# Patient Record
Sex: Female | Born: 1965 | Race: Black or African American | Hispanic: No | Marital: Single | Smoking: Current every day smoker
Health system: Southern US, Community
[De-identification: ages and names within clinical notes are randomized; demographics above are authoritative.]

## PROBLEM LIST (undated history)

## (undated) DIAGNOSIS — I639 Cerebral infarction, unspecified: Secondary | ICD-10-CM

## (undated) DIAGNOSIS — I10 Essential (primary) hypertension: Secondary | ICD-10-CM

## (undated) DIAGNOSIS — F1910 Other psychoactive substance abuse, uncomplicated: Secondary | ICD-10-CM

## (undated) DIAGNOSIS — T7840XA Allergy, unspecified, initial encounter: Secondary | ICD-10-CM

## (undated) DIAGNOSIS — E785 Hyperlipidemia, unspecified: Secondary | ICD-10-CM

## (undated) DIAGNOSIS — M1990 Unspecified osteoarthritis, unspecified site: Secondary | ICD-10-CM

## (undated) HISTORY — PX: ABDOMINAL SURGERY: SHX537

## (undated) HISTORY — DX: Other psychoactive substance abuse, uncomplicated: F19.10

## (undated) HISTORY — PX: TUBAL LIGATION: SHX77

## (undated) HISTORY — DX: Essential (primary) hypertension: I10

## (undated) HISTORY — DX: Unspecified osteoarthritis, unspecified site: M19.90

## (undated) HISTORY — DX: Allergy, unspecified, initial encounter: T78.40XA

---

## 2013-11-28 ENCOUNTER — Emergency Department: Payer: Self-pay | Admitting: Emergency Medicine

## 2014-06-05 ENCOUNTER — Other Ambulatory Visit: Payer: Self-pay | Admitting: Family Medicine

## 2014-06-05 ENCOUNTER — Ambulatory Visit
Admission: RE | Admit: 2014-06-05 | Discharge: 2014-06-05 | Disposition: A | Discharge status: Home / Self Care | Payer: Disability Insurance | Source: Ambulatory Visit | Attending: Family Medicine | Admitting: Family Medicine

## 2014-06-05 DIAGNOSIS — M25562 Pain in left knee: Secondary | ICD-10-CM

## 2014-06-05 DIAGNOSIS — M25561 Pain in right knee: Secondary | ICD-10-CM | POA: Diagnosis present

## 2014-06-05 DIAGNOSIS — M1712 Unilateral primary osteoarthritis, left knee: Secondary | ICD-10-CM | POA: Insufficient documentation

## 2017-08-10 ENCOUNTER — Encounter: Payer: Self-pay | Admitting: Emergency Medicine

## 2017-08-10 ENCOUNTER — Emergency Department
Admission: EM | Admit: 2017-08-10 | Discharge: 2017-08-10 | Disposition: A | Discharge status: Home / Self Care | Payer: Self-pay | Attending: Emergency Medicine | Admitting: Emergency Medicine

## 2017-08-10 ENCOUNTER — Emergency Department: Payer: Self-pay

## 2017-08-10 DIAGNOSIS — I10 Essential (primary) hypertension: Secondary | ICD-10-CM | POA: Insufficient documentation

## 2017-08-10 DIAGNOSIS — Z79899 Other long term (current) drug therapy: Secondary | ICD-10-CM | POA: Insufficient documentation

## 2017-08-10 DIAGNOSIS — M47816 Spondylosis without myelopathy or radiculopathy, lumbar region: Secondary | ICD-10-CM | POA: Insufficient documentation

## 2017-08-10 LAB — URINALYSIS, COMPLETE (UACMP) WITH MICROSCOPIC
Bacteria, UA: NONE SEEN
Bilirubin Urine: NEGATIVE
GLUCOSE, UA: NEGATIVE mg/dL
HGB URINE DIPSTICK: NEGATIVE
Ketones, ur: NEGATIVE mg/dL
Leukocytes, UA: NEGATIVE
NITRITE: NEGATIVE
PROTEIN: NEGATIVE mg/dL
Specific Gravity, Urine: 1.026 (ref 1.005–1.030)
pH: 5 (ref 5.0–8.0)

## 2017-08-10 MED ORDER — LISINOPRIL 10 MG PO TABS: 10.0000 mg | ORAL_TABLET | Freq: Every day | ORAL | 0 refills | Status: DC

## 2017-08-10 MED ORDER — MELOXICAM 15 MG PO TABS: 15.0000 mg | ORAL_TABLET | Freq: Every day | ORAL | 0 refills | Status: DC

## 2017-08-10 MED ORDER — CLONIDINE HCL 0.1 MG PO TABS
0.2000 mg | ORAL_TABLET | Freq: Once | ORAL | Status: AC
  Administered 2017-08-10: 0.2 mg via ORAL
  Filled 2017-08-10: qty 2

## 2017-08-10 MED ORDER — CYCLOBENZAPRINE HCL 10 MG PO TABS: 10.0000 mg | ORAL_TABLET | Freq: Three times a day (TID) | ORAL | 0 refills | Status: DC | PRN

## 2017-08-10 MED ORDER — TRAMADOL HCL 50 MG PO TABS
50.0000 mg | ORAL_TABLET | Freq: Once | ORAL | Status: AC
  Administered 2017-08-10: 50 mg via ORAL
  Filled 2017-08-10: qty 1

## 2017-08-10 MED ORDER — CYCLOBENZAPRINE HCL 10 MG PO TABS
10.0000 mg | ORAL_TABLET | Freq: Once | ORAL | Status: AC
  Administered 2017-08-10: 10 mg via ORAL
  Filled 2017-08-10: qty 1

## 2017-08-10 MED ORDER — TRAMADOL HCL 50 MG PO TABS: 50.0000 mg | ORAL_TABLET | Freq: Two times a day (BID) | ORAL | 0 refills | Status: DC | PRN

## 2017-08-10 NOTE — Discharge Instructions (Signed)
Take medication as directed pending establish care with PCP.

## 2017-08-10 NOTE — ED Triage Notes (Signed)
Patient presents to the ED with lower back pain x 2 weeks.  Patient denies any known injury to back.  Patient is in no obvious distress at this time.

## 2017-08-10 NOTE — ED Provider Notes (Signed)
[facility] Emergency Department Provider Note   ____________________________________________   First MD Initiated Contact with Patient 08/10/17 1617     (approximate)  I have reviewed the triage vital signs and the nursing notes.   HISTORY  Chief Complaint Back Pain    HPI Kimberly Mcdonald is a 52 y.o. female patient complain of increasing low back pain for 2 weeks.  Patient denies provocative incident for complaint.  Patient denies bladder or bowel dysfunction.  Patient denies radicular component to her back pain.  Patient rates the pain as a 10/10.  No palliative measure for complaint.  Patient presented for blood pressure of 169/121.  Patient acknowledged history of hypertension but reports noncompliance with medications.  Patient states she does not have a PCP.  Patient states she has not taken blood pressure medication in 7 years and did not know the name of her last hypertension medication.  Patient denies vision disturbance, headache, or weakness.  History reviewed. No pertinent past medical history.  There are no active problems to display for this patient.   History reviewed. No pertinent surgical history.  Prior to Admission medications   Medication Sig Start Date End Date Taking? Authorizing Provider  cyclobenzaprine (FLEXERIL) 10 MG tablet Take 1 tablet (10 mg total) by mouth 3 (three) times daily as needed. 08/10/17   Joni Reining, PA-C  lisinopril (PRINIVIL,ZESTRIL) 10 MG tablet Take 1 tablet (10 mg total) by mouth daily. 08/10/17 08/10/18  Joni Reining, PA-C  meloxicam (MOBIC) 15 MG tablet Take 1 tablet (15 mg total) by mouth daily. 08/10/17   Joni Reining, PA-C  traMADol (ULTRAM) 50 MG tablet Take 1 tablet (50 mg total) by mouth every 12 (twelve) hours as needed. 08/10/17   Joni Reining, PA-C    Allergies Patient has no known allergies.  No family history on file.  Social History Social History   Tobacco Use  .  Smoking status: Never Smoker  . Smokeless tobacco: Never Used  Substance Use Topics  . Alcohol use: Not on file  . Drug use: Not on file    Review of Systems Constitutional: No fever/chills Eyes: No visual changes. ENT: No sore throat. Cardiovascular: Denies chest pain. Respiratory: Denies shortness of breath. Gastrointestinal: No abdominal pain.  No nausea, no vomiting.  No diarrhea.  No constipation. Genitourinary: Negative for dysuria. Musculoskeletal: Positive for back pain. Skin: Negative for rash. Neurological: Negative for headaches, focal weakness, or numbness. Endocrine:Hypertension.   ____________________________________________   PHYSICAL EXAM:  VITAL SIGNS: ED Triage Vitals  Enc Vitals Group     BP 08/10/17 1604 (!) 169/121     Pulse Rate 08/10/17 1604 93     Resp 08/10/17 1604 18     Temp 08/10/17 1604 99 F (37.2 C)     Temp Source 08/10/17 1604 Oral     SpO2 08/10/17 1604 97 %     Weight 08/10/17 1605 214 lb (97.1 kg)     Height 08/10/17 1605 5\' 3"  (1.6 m)     Head Circumference --      Peak Flow --      Pain Score 08/10/17 1605 10     Pain Loc --      Pain Edu? --      Excl. in GC? --    Constitutional: Alert and oriented. Well appearing and in no acute distress. Cardiovascular: Normal rate, regular rhythm. Grossly normal heart sounds.  Good peripheral circulation.  Elevated blood pressure. Respiratory:  Normal respiratory effort.  No retractions. Lungs CTAB. Musculoskeletal: Bilateral lower extremity crepitus with palpation of the knee..  No joint effusions. Neurologic:  Normal speech and language. No gross focal neurologic deficits are appreciated. No gait instability. Skin:  Skin is warm, dry and intact. No rash noted. Psychiatric: Mood and affect are normal. Speech and behavior are normal.  ____________________________________________   LABS (all labs ordered are listed, but only abnormal results are displayed)  Labs Reviewed  URINALYSIS,  COMPLETE (UACMP) WITH MICROSCOPIC - Abnormal; Notable for the following components:      Result Value   Color, Urine YELLOW (*)    APPearance HAZY (*)    All other components within normal limits   ____________________________________________  EKG   ____________________________________________  RADIOLOGY  ED MD interpretation:    Official radiology report(s): Dg Lumbar Spine 2-3 Views  Result Date: 08/10/2017 CLINICAL DATA:  Lower back pain for 2 weeks, increasing EXAM: LUMBAR SPINE - 2-3 VIEW COMPARISON:  None FINDINGS: Five non-rib-bearing lumbar vertebra. Mild dextroconvex lumbar scoliosis. Facet degenerative changes L5-S1, less L4-L5. Vertebral body heights maintained with minimal anterolisthesis at L4-L5. Remaining alignments normal. Disc space narrowing at T11-T12 and T12-L1 with small endplate spurs. No fracture, additional subluxation, bone destruction or obvious spondylolysis. Mild sclerosis at the SI joints bilaterally. IMPRESSION: Degenerative changes of the thoracolumbar spine as above. Mild degenerative changes at the SI joints bilaterally Electronically Signed   By: Ulyses SouthwardMark  Boles M.D.   On: 08/10/2017 17:34    ____________________________________________   PROCEDURES  Procedure(s) performed: None  Procedures  Critical Care performed: No  ____________________________________________   INITIAL IMPRESSION / ASSESSMENT AND PLAN / ED COURSE  As part of my medical decision making, I reviewed the following data within the electronic MEDICAL RECORD NUMBER    Patient presents with 2 weeks of low back pain.  Patient also has elevated blood pressure.  Discussed x-ray findings showing degenerative change in the lumbar spine.  Patient blood pressure was lowered to 139/87 status post 0.2 mg Catapres.  Patient is also given tramadol and Flexeril which greatly reduce her low back pain.  Patient given discharge care instructions.  Patient advised to establish care with PCP.  Patient  given 1 month supply lisinopril and meloxicam.      ____________________________________________   FINAL CLINICAL IMPRESSION(S) / ED DIAGNOSES  Final diagnoses:  Spondylosis of lumbar region without myelopathy or radiculopathy  Essential hypertension     ED Discharge Orders        Ordered    meloxicam (MOBIC) 15 MG tablet  Daily     08/10/17 1827    lisinopril (PRINIVIL,ZESTRIL) 10 MG tablet  Daily     08/10/17 1827    traMADol (ULTRAM) 50 MG tablet  Every 12 hours PRN     08/10/17 1827    cyclobenzaprine (FLEXERIL) 10 MG tablet  3 times daily PRN     08/10/17 1827       Note:  This document was prepared using Dragon voice recognition software and may include unintentional dictation errors.    Joni ReiningSmith, Ladainian Therien K, PA-C 08/10/17 1850    Emily FilbertWilliams, Jonathan E, MD 08/10/17 2034

## 2017-08-20 ENCOUNTER — Ambulatory Visit: Payer: Self-pay

## 2017-09-01 ENCOUNTER — Ambulatory Visit: Payer: Self-pay

## 2017-09-03 ENCOUNTER — Ambulatory Visit: Payer: Self-pay | Admitting: Adult Health Nurse Practitioner

## 2017-09-03 DIAGNOSIS — M5441 Lumbago with sciatica, right side: Secondary | ICD-10-CM

## 2017-09-03 DIAGNOSIS — M1990 Unspecified osteoarthritis, unspecified site: Secondary | ICD-10-CM

## 2017-09-03 DIAGNOSIS — Z0000 Encounter for general adult medical examination without abnormal findings: Secondary | ICD-10-CM

## 2017-09-03 DIAGNOSIS — G8929 Other chronic pain: Secondary | ICD-10-CM

## 2017-09-03 DIAGNOSIS — I10 Essential (primary) hypertension: Secondary | ICD-10-CM

## 2017-09-03 DIAGNOSIS — M545 Low back pain, unspecified: Secondary | ICD-10-CM | POA: Insufficient documentation

## 2017-09-03 MED ORDER — LISINOPRIL 20 MG PO TABS: 20.0000 mg | ORAL_TABLET | Freq: Every day | ORAL | 1 refills | Status: DC

## 2017-09-03 MED ORDER — PREDNISONE 5 MG (21) PO TBPK: ORAL_TABLET | ORAL | 0 refills | Status: DC

## 2017-09-03 NOTE — Patient Instructions (Signed)

## 2017-09-03 NOTE — Progress Notes (Signed)
   Subjective:    Patient ID: Kimberly Mcdonald, female    DOB: November 12, 1965, 52 y.o.   MRN: [id number]  HPI  Kimberly Mcdonald is a 52 yo F here to establish care with us. She was recently seen in the ED on 08/10/17 for Lumbar Spondylosis and was also dx of Hypertension. She was given 1 mo supply of Lisinopril, Meloxicam, Tramadol, and Flexeril. HTN: BP elevated tonight at 185/105. Pt endorses that she has been taking Lisinopril. She reports in 2012 she was taking 5 HTN meds and a fluid pill but has not been taking them since [place]  Arthritis and Chronic bilateral low back pain: She reports pain with no improvement since ED visit and she is taking Tramadol and Flexeril. She reports she has some numbness from her R hip down.  Menopause: She reports hot flashes.  Pt is a current smoker and drinks occasionally. She actively uses marijuana.     There are no active problems to display for this patient.  Allergies as of 09/03/2017   No Known Allergies     Medication List        Accurate as of 09/03/17  6:27 PM. Always use your most recent med list.          cyclobenzaprine 10 MG tablet Commonly known as:  FLEXERIL Take 1 tablet (10 mg total) by mouth 3 (three) times daily as needed.   ibuprofen 200 MG tablet Commonly known as:  ADVIL,MOTRIN Take 200 mg by mouth every 6 (six) hours as needed.   lisinopril 10 MG tablet Commonly known as:  PRINIVIL,ZESTRIL Take 1 tablet (10 mg total) by mouth daily.   meloxicam 15 MG tablet Commonly known as:  MOBIC Take 1 tablet (15 mg total) by mouth daily.        Review of Systems  HENT: Negative for mouth sores.   Musculoskeletal: Negative for back pain.  All other systems reviewed and are negative.      Objective:   Physical Exam  Constitutional: She is oriented to person, place, and time. She appears well-developed and well-nourished.  Cardiovascular: Normal rate, regular rhythm and normal heart  sounds.  Pulmonary/Chest: Effort normal and breath sounds normal.  Abdominal: Soft. Bowel sounds are normal.  Musculoskeletal:  Negative straight leg raise  Neurological: She is alert and oriented to person, place, and time.  Psychiatric: She has a normal mood and affect. Her behavior is normal. Judgment and thought content normal.  Vitals reviewed.   BP (!) 185/105   Pulse 90   Temp 98.9 F (37.2 C)   Ht 5\' 3"  (1.6 m)   Wt 217 lb 8 oz (98.7 kg)   BMI 38.53 kg/m        Assessment & Plan:   HTN: Not controlled.  Increase Lisinopril to 20mg  from 10mg .   Arthritis and Chronic bilateral low back pain: Refer to our Orthopedic.  Rx Prednisone 5mg  21-pack Gave pt exercise information.   Routine labs tonight.   FU in 4 weeks for lab review and BP check.   F/u in 1 mo to review labs and monitor HTN.

## 2017-09-04 LAB — COMPREHENSIVE METABOLIC PANEL
ALBUMIN: 4 g/dL (ref 3.5–5.5)
ALT: 5 IU/L (ref 0–32)
AST: 10 IU/L (ref 0–40)
Albumin/Globulin Ratio: 1.3 (ref 1.2–2.2)
Alkaline Phosphatase: 97 IU/L (ref 39–117)
BUN/Creatinine Ratio: 30 — ABNORMAL HIGH (ref 9–23)
BUN: 20 mg/dL (ref 6–24)
Bilirubin Total: 0.4 mg/dL (ref 0.0–1.2)
CO2: 22 mmol/L (ref 20–29)
Calcium: 9.6 mg/dL (ref 8.7–10.2)
Chloride: 106 mmol/L (ref 96–106)
Creatinine, Ser: 0.67 mg/dL (ref 0.57–1.00)
GFR calc Af Amer: 116 mL/min/{1.73_m2} (ref >59)
GFR, EST NON AFRICAN AMERICAN: 101 mL/min/{1.73_m2} (ref >59)
GLOBULIN, TOTAL: 3 g/dL (ref 1.5–4.5)
Glucose: 77 mg/dL (ref 65–99)
Potassium: 3.9 mmol/L (ref 3.5–5.2)
SODIUM: 141 mmol/L (ref 134–144)
TOTAL PROTEIN: 7 g/dL (ref 6.0–8.5)

## 2017-09-04 LAB — CBC
HEMATOCRIT: 34.5 % (ref 34.0–46.6)
HEMOGLOBIN: 11.1 g/dL (ref 11.1–15.9)
MCH: 27.9 pg (ref 26.6–33.0)
MCHC: 32.2 g/dL (ref 31.5–35.7)
MCV: 87 fL (ref 79–97)
Platelets: 325 10*3/uL (ref 150–450)
RBC: 3.98 x10E6/uL (ref 3.77–5.28)
RDW: 15.8 % — ABNORMAL HIGH (ref 12.3–15.4)
WBC: 7.8 10*3/uL (ref 3.4–10.8)

## 2017-09-04 LAB — LIPID PANEL
CHOL/HDL RATIO: 3.5 ratio (ref 0.0–4.4)
Cholesterol, Total: 186 mg/dL (ref 100–199)
HDL: 53 mg/dL (ref >39)
LDL Calculated: 115 mg/dL — ABNORMAL HIGH (ref 0–99)
Triglycerides: 90 mg/dL (ref 0–149)
VLDL Cholesterol Cal: 18 mg/dL (ref 5–40)

## 2017-09-04 LAB — TSH: TSH: 1.7 u[IU]/mL (ref 0.450–4.500)

## 2017-09-04 LAB — HEMOGLOBIN A1C
ESTIMATED AVERAGE GLUCOSE: 100 mg/dL
Hgb A1c MFr Bld: 5.1 % (ref 4.8–5.6)

## 2017-09-08 ENCOUNTER — Telehealth: Payer: Self-pay | Admitting: Adult Health Nurse Practitioner

## 2017-09-08 NOTE — Telephone Encounter (Signed)
Patient called to ask where prescriptions were sent. Called and let her know at [facility]. She said that she was expecting a pain medication to be send (Advil or something else)

## 2017-09-09 ENCOUNTER — Ambulatory Visit: Payer: Self-pay | Admitting: Specialist

## 2017-09-09 ENCOUNTER — Other Ambulatory Visit: Payer: Self-pay

## 2017-09-16 ENCOUNTER — Ambulatory Visit: Payer: Self-pay | Admitting: Specialist

## 2017-09-16 DIAGNOSIS — M170 Bilateral primary osteoarthritis of knee: Secondary | ICD-10-CM

## 2017-09-16 NOTE — Progress Notes (Signed)
   Subjective:    Patient ID: Kimberly Mcdonald, female    DOB: 1965-09-03, 52 y.o.   MRN: [id number]  HPI 52 yr old with known OA of knees bilateral. Had X-Rays in 2015 that show end stage OA. Has been on Mobic with no help. Was on medrol pose pak with some relief.    Review of Systems   Works as Advertising copywriter. Does stairs one at a time and has difficulty bending to clean. Complained of low back pain I am not dealing with today.     Objective:   Physical Exam  Antalgic gate. Unable to heal/toe walk. Unable to squat. On inspection, left leg alignment neutral; right has significant genvarum.   Sitting she has full active extension of right knee with patellofemoral crepitus. On left, she lacks 20 degrees of full extension. Supine, no effusion. On left she has 0-120 flexion. On right she has 0-130 degrees of felxion. No M-L laxity.                                     Assessment & Plan:  End stage OA knees. She ultimately needs TKAs. Tells me she does not have the time currently but would be interested in steroid injection. We will refer to [facility].

## 2017-09-21 ENCOUNTER — Ambulatory Visit: Payer: Disability Insurance

## 2017-10-01 ENCOUNTER — Encounter: Payer: Self-pay | Admitting: Adult Health Nurse Practitioner

## 2017-10-01 ENCOUNTER — Ambulatory Visit: Payer: Self-pay | Admitting: Adult Health Nurse Practitioner

## 2017-10-01 VITALS — BP 191/118 | HR 90 | Temp 98.0°F | Ht 62.5 in | Wt 214.5 lb

## 2017-10-01 DIAGNOSIS — M1990 Unspecified osteoarthritis, unspecified site: Secondary | ICD-10-CM

## 2017-10-01 DIAGNOSIS — I10 Essential (primary) hypertension: Secondary | ICD-10-CM

## 2017-10-01 MED ORDER — LISINOPRIL 40 MG PO TABS: 40.0000 mg | ORAL_TABLET | Freq: Every day | ORAL | 1 refills | Status: DC

## 2017-10-01 MED ORDER — MELOXICAM 15 MG PO TABS: 15.0000 mg | ORAL_TABLET | Freq: Every day | ORAL | 1 refills | Status: DC

## 2017-10-01 MED ORDER — AMLODIPINE BESYLATE 10 MG PO TABS: 10.0000 mg | ORAL_TABLET | Freq: Every day | ORAL | 2 refills | Status: DC

## 2017-10-01 NOTE — Progress Notes (Signed)
  Patient: Kimberly Mcdonald Female    DOB: [birth year]   52 y.o.   MRN: [id number] Visit Date: 10/01/2017  Today's Provider: Jacelyn Pieah Doles-Johnson, NP   Chief Complaint  Patient presents with  . Knee Pain  . Hip Pain  . Hypertension   Subjective:    HPI  Pt states that her pain is unbearable.  Saw Dr. Justice RocherFossier on 9.4 with plans to refer to [facility] for steroid injections in her knees.  Prednisone dose pack only helped minimally-pain returned when finished.  Taking ibuprofen with minimal relief- "only takes the edge off"  States that she is having a lot of pain in her "butt bone"     No Known Allergies Previous Medications   CYCLOBENZAPRINE (FLEXERIL) 10 MG TABLET    Take 1 tablet (10 mg total) by mouth 3 (three) times daily as needed.   IBUPROFEN (ADVIL,MOTRIN) 200 MG TABLET    Take 200 mg by mouth every 6 (six) hours as needed.   LISINOPRIL (PRINIVIL,ZESTRIL) 20 MG TABLET    Take 1 tablet (20 mg total) by mouth daily.   MELOXICAM (MOBIC) 15 MG TABLET    Take 1 tablet (15 mg total) by mouth daily.   PREDNISONE (STERAPRED UNI-PAK 21 TAB) 5 MG (21) TBPK TABLET    Take 6 pills day 1, 5 pills day 2, 4 pills day 3, 3 pills day 4, 2 pills day 5 and 1 pill on the last day.    Review of Systems  All other systems reviewed and are negative.   Social History   Tobacco Use  . Smoking status: Current Some Day Smoker    Packs/day: 0.70    Types: Cigarettes  . Smokeless tobacco: Never Used  Substance Use Topics  . Alcohol use: Yes    Alcohol/week: 3.0 standard drinks    Types: 3 Cans of beer per week   Objective:   BP (!) 191/118 (BP Location: Left Arm, Patient Position: Sitting)   Pulse 90   Temp 98 F (36.7 C) (Oral)   Ht 5' 2.5" (1.588 m)   Wt 214 lb 8 oz (97.3 kg)   LMP 10/01/2016 (Approximate)   BMI 38.61 kg/m   Physical Exam  Constitutional: She appears well-developed and well-nourished.  Cardiovascular: Normal rate, regular rhythm and normal heart sounds.   Pulmonary/Chest: Effort normal and breath sounds normal.  Abdominal: Bowel sounds are normal.  Skin: Skin is warm and dry.  Vitals reviewed.       Assessment & Plan:         HTN:  Controlled.  Not controlled.  Goal BP <140/90.  Increase Lisinopril to 40mg , add Norvasc 10mg  daily.   Reviewed CVA symptoms.   Restart Meloxicam daily for pain.  Continue exercises.  FU with [facility].   4 week FU for BP and BMET.     Jacelyn Pieah Doles-Johnson, NP   [facility]

## 2017-10-02 ENCOUNTER — Other Ambulatory Visit: Payer: Self-pay | Admitting: Physician Assistant

## 2017-10-29 ENCOUNTER — Ambulatory Visit: Payer: Self-pay

## 2018-01-07 ENCOUNTER — Telehealth: Payer: Self-pay | Admitting: Pharmacy Technician

## 2018-01-07 NOTE — Telephone Encounter (Signed)
Still need 2018 tax return from patient.  Patient returned form [id number] allowing [facility] to obtain a copy of 2018 tax return from IRS.  Patient unable to receive additional medication assistance until requested financial documentation is provided to [facility].  Patient notified.  Sherilyn DacostaBetty J. Syrenity Mcdonald Care Manager [facility]

## 2018-01-10 ENCOUNTER — Other Ambulatory Visit: Payer: Self-pay | Admitting: Adult Health Nurse Practitioner

## 2018-02-18 ENCOUNTER — Telehealth: Payer: Self-pay | Admitting: Pharmacy Technician

## 2018-02-18 NOTE — Telephone Encounter (Signed)
Patient failed to provide 2020 financial documentation.  No additional medication will be provided by [facility] until requested documentation is provided.  Sherilyn Dacosta Care Manager [facility]

## 2019-05-08 ENCOUNTER — Emergency Department: Payer: Self-pay

## 2019-05-08 ENCOUNTER — Encounter: Payer: Self-pay | Admitting: Internal Medicine

## 2019-05-08 ENCOUNTER — Other Ambulatory Visit: Payer: Self-pay

## 2019-05-08 ENCOUNTER — Inpatient Hospital Stay
Admission: EM | Admit: 2019-05-08 | Discharge: 2019-05-15 | DRG: 038 | Disposition: A | Discharge status: Home / Self Care | Payer: Self-pay | Attending: Internal Medicine | Admitting: Internal Medicine

## 2019-05-08 DIAGNOSIS — I6522 Occlusion and stenosis of left carotid artery: Secondary | ICD-10-CM | POA: Diagnosis present

## 2019-05-08 DIAGNOSIS — E669 Obesity, unspecified: Secondary | ICD-10-CM | POA: Diagnosis present

## 2019-05-08 DIAGNOSIS — Z20822 Contact with and (suspected) exposure to covid-19: Secondary | ICD-10-CM | POA: Diagnosis present

## 2019-05-08 DIAGNOSIS — K922 Gastrointestinal hemorrhage, unspecified: Secondary | ICD-10-CM | POA: Diagnosis present

## 2019-05-08 DIAGNOSIS — K219 Gastro-esophageal reflux disease without esophagitis: Secondary | ICD-10-CM | POA: Diagnosis present

## 2019-05-08 DIAGNOSIS — D72829 Elevated white blood cell count, unspecified: Secondary | ICD-10-CM | POA: Diagnosis present

## 2019-05-08 DIAGNOSIS — E876 Hypokalemia: Secondary | ICD-10-CM | POA: Diagnosis present

## 2019-05-08 DIAGNOSIS — Z6834 Body mass index (BMI) 34.0-34.9, adult: Secondary | ICD-10-CM

## 2019-05-08 DIAGNOSIS — Z800 Family history of malignant neoplasm of digestive organs: Secondary | ICD-10-CM

## 2019-05-08 DIAGNOSIS — Z9114 Patient's other noncompliance with medication regimen: Secondary | ICD-10-CM

## 2019-05-08 DIAGNOSIS — I739 Peripheral vascular disease, unspecified: Secondary | ICD-10-CM | POA: Diagnosis present

## 2019-05-08 DIAGNOSIS — Z7151 Drug abuse counseling and surveillance of drug abuser: Secondary | ICD-10-CM

## 2019-05-08 DIAGNOSIS — I10 Essential (primary) hypertension: Secondary | ICD-10-CM | POA: Diagnosis present

## 2019-05-08 DIAGNOSIS — R29701 NIHSS score 1: Secondary | ICD-10-CM | POA: Diagnosis present

## 2019-05-08 DIAGNOSIS — D509 Iron deficiency anemia, unspecified: Secondary | ICD-10-CM | POA: Diagnosis present

## 2019-05-08 DIAGNOSIS — Z8673 Personal history of transient ischemic attack (TIA), and cerebral infarction without residual deficits: Secondary | ICD-10-CM

## 2019-05-08 DIAGNOSIS — Z806 Family history of leukemia: Secondary | ICD-10-CM

## 2019-05-08 DIAGNOSIS — Z7289 Other problems related to lifestyle: Secondary | ICD-10-CM

## 2019-05-08 DIAGNOSIS — Z79899 Other long term (current) drug therapy: Secondary | ICD-10-CM

## 2019-05-08 DIAGNOSIS — I63512 Cerebral infarction due to unspecified occlusion or stenosis of left middle cerebral artery: Principal | ICD-10-CM | POA: Diagnosis present

## 2019-05-08 DIAGNOSIS — Z713 Dietary counseling and surveillance: Secondary | ICD-10-CM

## 2019-05-08 DIAGNOSIS — D649 Anemia, unspecified: Secondary | ICD-10-CM

## 2019-05-08 DIAGNOSIS — I639 Cerebral infarction, unspecified: Secondary | ICD-10-CM

## 2019-05-08 DIAGNOSIS — D500 Iron deficiency anemia secondary to blood loss (chronic): Secondary | ICD-10-CM

## 2019-05-08 DIAGNOSIS — J302 Other seasonal allergic rhinitis: Secondary | ICD-10-CM | POA: Diagnosis present

## 2019-05-08 DIAGNOSIS — Z720 Tobacco use: Secondary | ICD-10-CM | POA: Diagnosis present

## 2019-05-08 DIAGNOSIS — D62 Acute posthemorrhagic anemia: Secondary | ICD-10-CM | POA: Diagnosis present

## 2019-05-08 DIAGNOSIS — D519 Vitamin B12 deficiency anemia, unspecified: Secondary | ICD-10-CM | POA: Diagnosis present

## 2019-05-08 DIAGNOSIS — Z8249 Family history of ischemic heart disease and other diseases of the circulatory system: Secondary | ICD-10-CM

## 2019-05-08 DIAGNOSIS — G8321 Monoplegia of upper limb affecting right dominant side: Secondary | ICD-10-CM | POA: Diagnosis present

## 2019-05-08 DIAGNOSIS — R110 Nausea: Secondary | ICD-10-CM | POA: Diagnosis not present

## 2019-05-08 DIAGNOSIS — R000 Tachycardia, unspecified: Secondary | ICD-10-CM | POA: Diagnosis present

## 2019-05-08 DIAGNOSIS — Z716 Tobacco abuse counseling: Secondary | ICD-10-CM

## 2019-05-08 DIAGNOSIS — E785 Hyperlipidemia, unspecified: Secondary | ICD-10-CM | POA: Diagnosis present

## 2019-05-08 DIAGNOSIS — F17210 Nicotine dependence, cigarettes, uncomplicated: Secondary | ICD-10-CM | POA: Diagnosis present

## 2019-05-08 DIAGNOSIS — F1210 Cannabis abuse, uncomplicated: Secondary | ICD-10-CM | POA: Diagnosis present

## 2019-05-08 DIAGNOSIS — Z811 Family history of alcohol abuse and dependence: Secondary | ICD-10-CM

## 2019-05-08 DIAGNOSIS — R200 Anesthesia of skin: Secondary | ICD-10-CM | POA: Diagnosis present

## 2019-05-08 HISTORY — DX: Hyperlipidemia, unspecified: E78.5

## 2019-05-08 HISTORY — DX: Essential (primary) hypertension: I10

## 2019-05-08 HISTORY — DX: Iron deficiency anemia secondary to blood loss (chronic): D50.0

## 2019-05-08 HISTORY — DX: Cerebral infarction, unspecified: I63.9

## 2019-05-08 LAB — DIFFERENTIAL
Abs Immature Granulocytes: 0.03 10*3/uL (ref 0.00–0.07)
Basophils Absolute: 0 10*3/uL (ref 0.0–0.1)
Basophils Relative: 0 %
Eosinophils Absolute: 0 10*3/uL (ref 0.0–0.5)
Eosinophils Relative: 0 %
Immature Granulocytes: 0 %
Lymphocytes Relative: 20 %
Lymphs Abs: 1.8 10*3/uL (ref 0.7–4.0)
Monocytes Absolute: 0.6 10*3/uL (ref 0.1–1.0)
Monocytes Relative: 7 %
Neutro Abs: 6.6 10*3/uL (ref 1.7–7.7)
Neutrophils Relative %: 73 %
Smear Review: ADEQUATE

## 2019-05-08 LAB — CBC
HCT: 16 % — ABNORMAL LOW (ref 36.0–46.0)
HCT: 15.2 % — ABNORMAL LOW (ref 36.0–46.0)
Hemoglobin: 4 g/dL — CL (ref 12.0–15.0)
Hemoglobin: 3.8 g/dL — CL (ref 12.0–15.0)
MCH: 15.7 pg — ABNORMAL LOW (ref 26.0–34.0)
MCH: 15.5 pg — ABNORMAL LOW (ref 26.0–34.0)
MCHC: 25 g/dL — ABNORMAL LOW (ref 30.0–36.0)
MCHC: 25 g/dL — ABNORMAL LOW (ref 30.0–36.0)
MCV: 62.7 fL — ABNORMAL LOW (ref 80.0–100.0)
MCV: 62 fL — ABNORMAL LOW (ref 80.0–100.0)
Platelets: 428 10*3/uL — ABNORMAL HIGH (ref 150–400)
Platelets: 341 10*3/uL (ref 150–400)
RBC: 2.55 MIL/uL — ABNORMAL LOW (ref 3.87–5.11)
RBC: 2.45 MIL/uL — ABNORMAL LOW (ref 3.87–5.11)
RDW: 24.7 % — ABNORMAL HIGH (ref 11.5–15.5)
RDW: 24.5 % — ABNORMAL HIGH (ref 11.5–15.5)
WBC: 9.1 10*3/uL (ref 4.0–10.5)
WBC: 9.1 10*3/uL (ref 4.0–10.5)
nRBC: 0 % (ref 0.0–0.2)
nRBC: 0 % (ref 0.0–0.2)

## 2019-05-08 LAB — COMPREHENSIVE METABOLIC PANEL
ALT: 6 U/L (ref 0–44)
AST: 11 U/L — ABNORMAL LOW (ref 15–41)
Albumin: 3.6 g/dL (ref 3.5–5.0)
Alkaline Phosphatase: 93 U/L (ref 38–126)
Anion gap: 7 (ref 5–15)
BUN: 8 mg/dL (ref 6–20)
CO2: 25 mmol/L (ref 22–32)
Calcium: 9.1 mg/dL (ref 8.9–10.3)
Chloride: 108 mmol/L (ref 98–111)
Creatinine, Ser: 0.55 mg/dL (ref 0.44–1.00)
GFR calc Af Amer: >60 mL/min (ref >60)
GFR calc non Af Amer: >60 mL/min (ref >60)
Glucose, Bld: 109 mg/dL — ABNORMAL HIGH (ref 70–99)
Potassium: 3.2 mmol/L — ABNORMAL LOW (ref 3.5–5.1)
Sodium: 140 mmol/L (ref 135–145)
Total Bilirubin: 0.8 mg/dL (ref 0.3–1.2)
Total Protein: 7.8 g/dL (ref 6.5–8.1)

## 2019-05-08 LAB — RETICULOCYTES
Immature Retic Fract: 28.6 % — ABNORMAL HIGH (ref 2.3–15.9)
RBC.: 2.39 MIL/uL — ABNORMAL LOW (ref 3.87–5.11)
Retic Count, Absolute: 73.1 10*3/uL (ref 19.0–186.0)
Retic Ct Pct: 3.1 % (ref 0.4–3.1)

## 2019-05-08 LAB — FERRITIN: Ferritin: 2 ng/mL — ABNORMAL LOW (ref 11–307)

## 2019-05-08 LAB — IRON AND TIBC
Iron: 10 ug/dL — ABNORMAL LOW (ref 28–170)
Saturation Ratios: 2 % — ABNORMAL LOW (ref 10.4–31.8)
TIBC: 629 ug/dL — ABNORMAL HIGH (ref 250–450)
UIBC: 619 ug/dL

## 2019-05-08 LAB — RESPIRATORY PANEL BY RT PCR (FLU A&B, COVID)
Influenza A by PCR: NEGATIVE
Influenza B by PCR: NEGATIVE
SARS Coronavirus 2 by RT PCR: NEGATIVE

## 2019-05-08 LAB — PROTIME-INR
INR: 1 (ref 0.8–1.2)
Prothrombin Time: 13.2 seconds (ref 11.4–15.2)

## 2019-05-08 LAB — PREPARE RBC (CROSSMATCH)

## 2019-05-08 LAB — FOLATE: Folate: 6.8 ng/mL (ref >5.9)

## 2019-05-08 LAB — APTT: aPTT: 24 seconds (ref 24–36)

## 2019-05-08 LAB — VITAMIN B12: Vitamin B-12: 201 pg/mL (ref 180–914)

## 2019-05-08 LAB — ABO/RH: ABO/RH(D): O POS

## 2019-05-08 MED ORDER — ATORVASTATIN CALCIUM 20 MG PO TABS
40.0000 mg | ORAL_TABLET | Freq: Every day | ORAL | Status: DC
  Administered 2019-05-08 – 2019-05-15 (×7): 40 mg via ORAL
  Filled 2019-05-08 (×7): qty 2

## 2019-05-08 MED ORDER — SODIUM CHLORIDE 0.9 % IV BOLUS
1000.0000 mL | Freq: Once | INTRAVENOUS | Status: AC
  Administered 2019-05-08: 1000 mL via INTRAVENOUS

## 2019-05-08 MED ORDER — PANTOPRAZOLE SODIUM 40 MG IV SOLR: 40.0000 mg | Freq: Two times a day (BID) | INTRAVENOUS | Status: DC

## 2019-05-08 MED ORDER — SODIUM CHLORIDE 0.9 % IV SOLN: INTRAVENOUS | Status: DC

## 2019-05-08 MED ORDER — PEG 3350-KCL-NA BICARB-NACL 420 G PO SOLR
4000.0000 mL | Freq: Once | ORAL | Status: DC
  Filled 2019-05-08: qty 4000

## 2019-05-08 MED ORDER — ACETAMINOPHEN 325 MG PO TABS
650.0000 mg | ORAL_TABLET | ORAL | Status: DC | PRN
  Administered 2019-05-11: 650 mg via ORAL
  Filled 2019-05-08: qty 2

## 2019-05-08 MED ORDER — SODIUM CHLORIDE 0.9% IV SOLUTION
Freq: Once | INTRAVENOUS | Status: AC
  Filled 2019-05-08: qty 250

## 2019-05-08 MED ORDER — SODIUM CHLORIDE 0.9 % IV SOLN
8.0000 mg/h | INTRAVENOUS | Status: DC
  Administered 2019-05-08 (×2): 8 mg/h via INTRAVENOUS
  Filled 2019-05-08 (×2): qty 80

## 2019-05-08 MED ORDER — ONDANSETRON HCL 4 MG/2ML IJ SOLN: 4.0000 mg | Freq: Three times a day (TID) | INTRAMUSCULAR | Status: DC | PRN

## 2019-05-08 MED ORDER — SODIUM CHLORIDE 0.9 % IV SOLN
80.0000 mg | Freq: Once | INTRAVENOUS | Status: AC
  Administered 2019-05-08: 80 mg via INTRAVENOUS
  Filled 2019-05-08: qty 80

## 2019-05-08 MED ORDER — ACETAMINOPHEN 650 MG RE SUPP: 650.0000 mg | RECTAL | Status: DC | PRN

## 2019-05-08 MED ORDER — POTASSIUM CHLORIDE CRYS ER 20 MEQ PO TBCR
40.0000 meq | EXTENDED_RELEASE_TABLET | Freq: Once | ORAL | Status: AC
  Administered 2019-05-08: 40 meq via ORAL
  Filled 2019-05-08: qty 2

## 2019-05-08 MED ORDER — STROKE: EARLY STAGES OF RECOVERY BOOK: Freq: Once | Status: DC

## 2019-05-08 MED ORDER — NICOTINE 21 MG/24HR TD PT24
21.0000 mg | MEDICATED_PATCH | Freq: Every day | TRANSDERMAL | Status: DC
  Administered 2019-05-09 – 2019-05-14 (×5): 21 mg via TRANSDERMAL
  Filled 2019-05-08 (×6): qty 1

## 2019-05-08 MED ORDER — SODIUM CHLORIDE 0.9% IV SOLUTION: Freq: Once | INTRAVENOUS | Status: AC

## 2019-05-08 MED ORDER — ACETAMINOPHEN 160 MG/5ML PO SOLN
650.0000 mg | ORAL | Status: DC | PRN
  Filled 2019-05-08: qty 20.3

## 2019-05-08 MED ORDER — HYDRALAZINE HCL 20 MG/ML IJ SOLN: 5.0000 mg | INTRAMUSCULAR | Status: DC | PRN

## 2019-05-08 NOTE — ED Notes (Signed)
Report to Noah, RN  

## 2019-05-08 NOTE — ED Notes (Signed)
Date and time results received: 05/08/19 3:05 PM  (use smartphrase ".now" to insert current time)  Test: Hgb Critical Value: 3.8  Name of Provider Notified: Dr. Clyde Lundborg  Orders Received? Or Actions Taken?: notified via Secure chat, awaiting orders

## 2019-05-08 NOTE — ED Notes (Signed)
EDP at bedside at this time.  

## 2019-05-08 NOTE — H&P (Addendum)
History and Physical    Kimberly Mcdonald YBO:[id number] DOB: Jun 06, 1965 DOA: 05/08/2019  Referring MD/NP/PA:   PCP: Patient, No Pcp Per   Patient coming from:  The patient is coming from home.  At baseline, pt is independent for most of ADL.        Chief Complaint: Dark stool and right arm numbness  HPI: Kimberly Mcdonald is a 54 y.o. female with medical history significant of hypertension, substance abuse, tobacco abuse, who presents with dark stool on the right arm numbness.  Patient states that she has been having dark stool which has been going on for several weeks.  She has generalized weakness.  No nausea, vomiting, abdominal pain.  No symptoms of UTI.  Patient denies any chest pain, cough, shortness breath.  Patient states that she has right arm numbness. She states that numbness initially started in right hand fingers, then progressed to involve the right arm. She feesl that her right arm is heavy. No difficulty speaking, vision loss or hearing loss.  No facial droop. Pt denies taking NSAIDs.  ED Course: pt was found to have WBC 9.1, hemoglobin 11.1 on 09/03/17 -->4.0 --> 3.8, INR 1.0, PTT 24, pending COVID-19 PCR, potassium 3.2, renal function okay, temperature normal, blood pressure 149/94, tachycardia, RR 18, oxygen saturation 94% on room air.  Patient is admitted to progressive unit as inpatient.   # CT-head showed: 1. No definite acute intracranial pathology.  2. Suspect small lacunar infarctions of the left caudate head and anterior limb of the right internal capsule/adjacent corona radiata, generally age indeterminate. Consider MRI to more sensitively assess for acute diffusion restricting infarction if indicated by neurological signs and symptoms.  Review of Systems:   General: no fevers, chills, no body weight gain, has fatigue HEENT: no blurry vision, hearing changes or sore throat Respiratory: no dyspnea, coughing, wheezing CV: no chest pain, no palpitations GI:  no nausea, vomiting, abdominal pain, diarrhea, constipation. Has dark stool. GU: no dysuria, burning on urination, increased urinary frequency, hematuria Ext: no leg edema Neuro: No vision change or hearing loss. Has right arm numbness Skin: no rash, no skin tear. MSK: No muscle spasm, no deformity, no limitation of range of movement in spin Heme: No easy bruising.  Travel history: No recent long distant travel.  Allergy: No Known Allergies  Past Medical History:  Diagnosis Date  . Allergy    seasonal  . Anemia due to blood loss 05/08/2019  . Arthritis    knees and back  . Hypertension   . Substance abuse ([place])    Marijuana smoker x35 yrs    Past Surgical History:  Procedure Laterality Date  . TUBAL LIGATION      Social History:  reports that she has been smoking cigarettes. She has been smoking about 0.70 packs per day. She has never used smokeless tobacco. She reports current alcohol use of about 3.0 standard drinks of alcohol per week. She reports current drug use. Frequency: 7.00 times per week. Drug: Marijuana.  Family History:  Family History  Problem Relation Age of Onset  . Diabetes Mother   . Hypertension Mother   . Cancer Father   . Alcohol abuse Sister      Prior to Admission medications   Not on File    Physical Exam: Vitals:   05/08/19 1815 05/08/19 1818 05/08/19 1821 05/08/19 1823  BP: (!) 149/90 (!) 158/90 (!) 150/91 (!) 156/89  Pulse: 88 86 86 83  Resp: 18 14 14  (!)  22  Temp:    98.7 F (37.1 C)  TempSrc:    Oral  SpO2: 100% 98% 99% 99%  Weight:      Height:       General: Not in acute distress. Pale looking. HEENT:       Eyes: PERRL, EOMI, no scleral icterus.       ENT: No discharge from the ears and nose, no pharynx injection, no tonsillar enlargement.        Neck: No JVD, no bruit, no mass felt. Heme: No neck lymph node enlargement. Cardiac: S1/S2, RRR, No murmurs, No gallops or rubs. Respiratory:  No rales, wheezing, rhonchi or  rubs. GI: Soft, nondistended, nontender, no rebound pain, no organomegaly, BS present. GU: No hematuria Ext: No pitting leg edema bilaterally. 2+DP/PT pulse bilaterally. Musculoskeletal: No joint deformities, No joint redness or warmth, no limitation of ROM in spin. Skin: No rashes.  Neuro: Alert, oriented X3, cranial nerves II-XII grossly intact, moves all extremities normally.  Psych: Patient is not psychotic, no suicidal or hemocidal ideation.  Labs on Admission: I have personally reviewed following labs and imaging studies  CBC: Recent Labs  Lab 05/08/19 1215 05/08/19 1427  WBC 9.1 9.1  NEUTROABS 6.6  --   HGB 4.0* 3.8*  HCT 16.0* 15.2*  MCV 62.7* 62.0*  PLT 428* 341   Basic Metabolic Panel: Recent Labs  Lab 05/08/19 1215  NA 140  K 3.2*  CL 108  CO2 25  GLUCOSE 109*  BUN 8  CREATININE 0.55  CALCIUM 9.1   GFR: Estimated Creatinine Clearance: 86.7 mL/min (by C-G formula based on SCr of 0.55 mg/dL). Liver Function Tests: Recent Labs  Lab 05/08/19 1215  AST 11*  ALT 6  ALKPHOS 93  BILITOT 0.8  PROT 7.8  ALBUMIN 3.6   No results for input(s): LIPASE, AMYLASE in the last 168 hours. No results for input(s): AMMONIA in the last 168 hours. Coagulation Profile: Recent Labs  Lab 05/08/19 1215  INR 1.0   Cardiac Enzymes: No results for input(s): CKTOTAL, CKMB, CKMBINDEX, TROPONINI in the last 168 hours. BNP (last 3 results) No results for input(s): PROBNP in the last 8760 hours. HbA1C: No results for input(s): HGBA1C in the last 72 hours. CBG: No results for input(s): GLUCAP in the last 168 hours. Lipid Profile: No results for input(s): CHOL, HDL, LDLCALC, TRIG, CHOLHDL, LDLDIRECT in the last 72 hours. Thyroid Function Tests: No results for input(s): TSH, T4TOTAL, FREET4, T3FREE, THYROIDAB in the last 72 hours. Anemia Panel: Recent Labs    05/08/19 1427  VITAMINB12 201  FOLATE 6.8  FERRITIN 2*  TIBC 629*  IRON 10*  RETICCTPCT 3.1   Urine  analysis:    Component Value Date/Time   COLORURINE YELLOW (A) 08/10/2017 1628   APPEARANCEUR HAZY (A) 08/10/2017 1628   LABSPEC 1.026 08/10/2017 1628   PHURINE 5.0 08/10/2017 1628   GLUCOSEU NEGATIVE 08/10/2017 1628   HGBUR NEGATIVE 08/10/2017 1628   BILIRUBINUR NEGATIVE 08/10/2017 1628   KETONESUR NEGATIVE 08/10/2017 1628   PROTEINUR NEGATIVE 08/10/2017 1628   NITRITE NEGATIVE 08/10/2017 1628   LEUKOCYTESUR NEGATIVE 08/10/2017 1628   Sepsis Labs: @LABRCNTIP (procalcitonin:4,lacticidven:4) ) Recent Results (from the past 240 hour(s))  Respiratory Panel by RT PCR (Flu A&B, Covid) - Nasopharyngeal Swab     Status: None   Collection Time: 05/08/19  1:22 PM   Specimen: Nasopharyngeal Swab  Result Value Ref Range Status   SARS Coronavirus 2 by RT PCR NEGATIVE NEGATIVE Final  Comment: (NOTE) SARS-CoV-2 target nucleic acids are NOT DETECTED. The SARS-CoV-2 RNA is generally detectable in upper respiratoy specimens during the acute phase of infection. The lowest concentration of SARS-CoV-2 viral copies this assay can detect is 131 copies/mL. A negative result does not preclude SARS-Cov-2 infection and should not be used as the sole basis for treatment or other patient management decisions. A negative result may occur with  improper specimen collection/handling, submission of specimen other than nasopharyngeal swab, presence of viral mutation(s) within the areas targeted by this assay, and inadequate number of viral copies (<131 copies/mL). A negative result must be combined with clinical observations, patient history, and epidemiological information. The expected result is Negative. Fact Sheet for Patients:  https://www.moore.com/https://www.fda.gov/media/142436/download Fact Sheet for Healthcare Providers:  https://www.young.biz/https://www.fda.gov/media/142435/download This test is not yet ap proved or cleared by the [place] FDA and  has been authorized for detection and/or diagnosis of SARS-CoV-2 by FDA under an  Emergency Use Authorization (EUA). This EUA will remain  in effect (meaning this test can be used) for the duration of the COVID-19 declaration under Section 564(b)(1) of the Act, 21 U.S.C. section 360bbb-3(b)(1), unless the authorization is terminated or revoked sooner.    Influenza A by PCR NEGATIVE NEGATIVE Final   Influenza B by PCR NEGATIVE NEGATIVE Final    Comment: (NOTE) The Xpert Xpress SARS-CoV-2/FLU/RSV assay is intended as an aid in  the diagnosis of influenza from Nasopharyngeal swab specimens and  should not be used as a sole basis for treatment. Nasal washings and  aspirates are unacceptable for Xpert Xpress SARS-CoV-2/FLU/RSV  testing. Fact Sheet for Patients: https://www.moore.com/https://www.fda.gov/media/142436/download Fact Sheet for Healthcare Providers: https://www.young.biz/https://www.fda.gov/media/142435/download This test is not yet approved or cleared by the [place] FDA and  has been authorized for detection and/or diagnosis of SARS-CoV-2 by  FDA under an Emergency Use Authorization (EUA). This EUA will remain  in effect (meaning this test can be used) for the duration of the  Covid-19 declaration under Section 564(b)(1) of the Act, 21  U.S.C. section 360bbb-3(b)(1), unless the authorization is  terminated or revoked. Performed at [facility], [address] [place], [place] [address]      Radiological Exams on Admission: CT HEAD WO CONTRAST  Result Date: 05/08/2019 CLINICAL DATA:  Numbness, possible stroke EXAM: CT HEAD WITHOUT CONTRAST TECHNIQUE: Contiguous axial images were obtained from the base of the skull through the vertex without intravenous contrast. COMPARISON:  None. FINDINGS: Brain: No definite evidence of acute infarction, hemorrhage, hydrocephalus, extra-axial collection or mass lesion/mass effect. Suspect small lacunar infarctions of the left caudate head and anterior limb of the right internal capsule/adjacent corona radiata (series 2, image 14). Vascular: No  hyperdense vessel or unexpected calcification. Skull: Normal. Negative for fracture or focal lesion. Sinuses/Orbits: No acute finding. Other: None. IMPRESSION: No definite acute intracranial pathology. Suspect small lacunar infarctions of the left caudate head and anterior limb of the right internal capsule/adjacent corona radiata, generally age indeterminate. Consider MRI to more sensitively assess for acute diffusion restricting infarction if indicated by neurological signs and symptoms. Electronically Signed   By: Lauralyn PrimesAlex  Bibbey M.D.   On: 05/08/2019 12:54     EKG: Independently reviewed.  Sinus rhythm, QTC 467, T wave inversion in inferior leads and V3-V6, early R wave progression  Assessment/Plan Principal Problem:   GIB (gastrointestinal bleeding) Active Problems:   Hypertension   Right arm numbness   Anemia due to blood loss   Tobacco abuse   Hypokalemia   GIB (gastrointestinal bleeding)  and anemia due to blood loss: Hgb 11.8 --> 4.0 -->3.8. Dr. Servando Snare of GI is consulted.  Patient has a tachycardia, but hemodynamically stable  - will admitted to progressive bed as inpatient -->changed to med-surg bed after finishing 1 unit of blood transfusion - transfuse 3 units of blood now - GI consulted by Ed, will follow up recommendations - NPO for possible EGD - IVF: 1L NS bolus, then at 75 mL/hr - Start IV pantoprazole gtt - Zofran IV for nausea - Avoid NSAIDs and SQ heparin - Maintain IV access (2 large bore IVs if possible). - Monitor closely and follow q6h cbc, transfuse as necessary, if Hgb<7.0 - LaB: INR, PTT and type screen - check anemia panel  Hypertension: pt is not taking Bp med. Will not schedule Bp meds now due to severe anemia -IV hydralazine as needed  Right arm numbness: Etiology is not clear.  CT of head showed possible lacunar stroke -Will get MRI of the brain, if positive --> will need to do stroke work-up -Will not give aspirin due to GI bleeding -Will start Lipitor  empirically 40 mg daily now -message sent to Dr. Loretha Brasil for neurology for consultation  Tobacco abuse: -Did counseling about importance of quitting smoking -Nicotine patch  Hypokalemia: K= 3.2 on admission. - Repleted - Check Mg level   DVT ppx: SCD Code Status: Full code Family Communication: not done, no family member is at bed side.   Disposition Plan:  Anticipate discharge back to previous home environment Consults called:  Dr. Servando Snare of GI, and message sent to Dr. Loretha Brasil of neurology Admission status: progressive unit  as inpt  --> changed to med-surg after finishing 1 U of blood tranfusion.      Status is: Inpatient Remains inpatient appropriate because:IV treatments appropriate due to intensity Dispo: The patient is from: Home              Anticipated d/c is to: Home              Anticipated d/c date is: 2 days              Patient currently is not medically stable to d/c.    Inpatient status:  # Patient requires inpatient status due to high intensity of service, high risk for further deterioration and high frequency of surveillance required.  I certify that at the point of admission it is my clinical judgment that the patient will require inpatient hospital care spanning beyond 2 midnights from the point of admission.  . This patient has multiple chronic comorbidities including hypertension, substance abuse, tobacco abuse, who presents with dark stool on the right arm numbness. . Now patient has presenting with GIB with severe anemia, right arm numbness suspecting stroke . The worrisome physical exam findings include pale looking, AMS . The initial radiographic and laboratory data are worrisome because of severe anemia with a hemoglobin down to 3.8, hypokalemia, CT of the head showed possible lacunar stroke . Current medical needs: please see my assessment and plan . Predictability of an adverse outcome (risk): Patient has multiple comorbidities as listed above. Now  presents with GIB with severe anemia, right arm numbness suspecting stroke. Patient's presentation is highly complicated.  Patient is at high risk of deteriorating.  Will need to be treated in hospital for at least 2 days.                 Date of Service 05/08/2019    Lorretta Harp [facility]  If 7PM-7AM, please contact night-coverage [email or web address] 05/08/2019, 6:46 PM

## 2019-05-08 NOTE — ED Triage Notes (Signed)
Pt here today for numbness to right arm. Started just in 4 fingers of right hand and now in whole right arm with arm feeling heavy.  Tongue and mucous membranes appear pale when talking, when asked pt about stool color has had black stools for last month.  Pt has had some fatigue.  Speech clear. No facial droop. Denies blood thinners.

## 2019-05-08 NOTE — Consult Note (Signed)
Lucilla Lame, MD [facility]  [address], [place], [place] [address] Phone: [phone] Fax : [phone]  Consultation  Referring Provider:     Dr. Blaine Hamper Primary Care Physician:  Patient, No Pcp Per Primary Gastroenterologist: Althia Forts         Reason for Consultation:     Profound anemia  Date of Admission:  05/08/2019 Date of Consultation:  05/08/2019         HPI:   Kimberly Mcdonald is a 54 y.o. female who presents with anemia.  The patient states that she was at home when she started feeling tingling in her right hand and thought she may be having a stroke so she came to the hospital.  The patient's previous lab work from August 2019 showed her to have a hemoglobin of 11.1.  The patient had blood work done on admission that showed her to have a hemoglobin of 4.0 with an MCV of 62.7. The patient states that she has been having black stools intermittently for a month.  She states that her stool yesterday was brown but the day before that was black.  She denies any NSAID use.  She denies any BCs or Goody powders.  She does have knee pain but states that she stopped taking all anti-inflammatories back in December when she started a new job.  There is no report of any family history of colon cancer or colon polyps but she does report a grandfather with stomach cancer.  Her father also had leukemia.  There is no report of any hematemesis associated with the black stools or any bright red blood per rectum.  Past Medical History:  Diagnosis Date  . Allergy    seasonal  . Anemia due to blood loss 05/08/2019  . Arthritis    knees and back  . Hypertension   . Substance abuse ([place])    Marijuana smoker x35 yrs    Past Surgical History:  Procedure Laterality Date  . TUBAL LIGATION      Prior to Admission medications   Not on File    Family History  Problem Relation Age of Onset  . Diabetes Mother   . Hypertension Mother   . Cancer Father   . Alcohol abuse Sister       Social History   Tobacco Use  . Smoking status: Current Some Day Smoker    Packs/day: 0.70    Types: Cigarettes  . Smokeless tobacco: Never Used  Substance Use Topics  . Alcohol use: Yes    Alcohol/week: 3.0 standard drinks    Types: 3 Cans of beer per week  . Drug use: Yes    Frequency: 7.0 times per week    Types: Marijuana    Allergies as of 05/08/2019  . (No Known Allergies)    Review of Systems:    All systems reviewed and negative except where noted in HPI.   Physical Exam:  Vital signs in last 24 hours: Temp:  [98.1 F (36.7 C)] 98.1 F (36.7 C) (04/25 1203) Pulse Rate:  [98] 98 (04/25 1203) Resp:  [18] 18 (04/25 1203) BP: (149)/(94) 149/94 (04/25 1203) SpO2:  [94 %] 94 % (04/25 1203) Weight:  [90.7 kg] 90.7 kg (04/25 1204)   General:   Pleasant, cooperative in NAD Head:  Normocephalic and atraumatic. Eyes:   No icterus.   Conjunctiva pale. PERRLA. Ears:  Normal auditory acuity. Neck:  Supple; no masses or thyroidomegaly Lungs: Respirations even and unlabored. Lungs clear to  auscultation bilaterally.   No wheezes, crackles, or rhonchi.  Heart:  Regular rate and rhythm;  Without murmur, clicks, rubs or gallops Abdomen:  Soft, nondistended, nontender. Normal bowel sounds. No appreciable masses or hepatomegaly.  No rebound or guarding.  Rectal:  Not performed. Msk:  Symmetrical without gross deformities.    Extremities:  Without edema, cyanosis or clubbing. Neurologic:  Alert and oriented x3;  grossly normal neurologically. Skin:  Intact without significant lesions or rashes. Cervical Nodes:  No significant cervical adenopathy. Psych:  Alert and cooperative. Normal affect.  LAB RESULTS: Recent Labs    05/08/19 1215  WBC 9.1  HGB 4.0*  HCT 16.0*  PLT 428*   BMET Recent Labs    05/08/19 1215  NA 140  K 3.2*  CL 108  CO2 25  GLUCOSE 109*  BUN 8  CREATININE 0.55  CALCIUM 9.1   LFT Recent Labs    05/08/19 1215  PROT 7.8  ALBUMIN 3.6   AST 11*  ALT 6  ALKPHOS 93  BILITOT 0.8   PT/INR Recent Labs    05/08/19 1215  LABPROT 13.2  INR 1.0    STUDIES: CT HEAD WO CONTRAST  Result Date: 05/08/2019 CLINICAL DATA:  Numbness, possible stroke EXAM: CT HEAD WITHOUT CONTRAST TECHNIQUE: Contiguous axial images were obtained from the base of the skull through the vertex without intravenous contrast. COMPARISON:  None. FINDINGS: Brain: No definite evidence of acute infarction, hemorrhage, hydrocephalus, extra-axial collection or mass lesion/mass effect. Suspect small lacunar infarctions of the left caudate head and anterior limb of the right internal capsule/adjacent corona radiata (series 2, image 14). Vascular: No hyperdense vessel or unexpected calcification. Skull: Normal. Negative for fracture or focal lesion. Sinuses/Orbits: No acute finding. Other: None. IMPRESSION: No definite acute intracranial pathology. Suspect small lacunar infarctions of the left caudate head and anterior limb of the right internal capsule/adjacent corona radiata, generally age indeterminate. Consider MRI to more sensitively assess for acute diffusion restricting infarction if indicated by neurological signs and symptoms. Electronically Signed   By: Lauralyn Primes M.D.   On: 05/08/2019 12:54      Impression / Plan:   Assessment: Principal Problem:   GIB (gastrointestinal bleeding) Active Problems:   Hypertension   Right arm numbness   Anemia due to blood loss   Kimberly Mcdonald is a 54 y.o. y/o female with profound anemia with a hemoglobin of 4.0 and INR of 1.0 and a low MCV.  The patient has had intermittent black stools for a month despite not having any NSAIDs.  The patient also denies having a primary care provider or ever having a upper endoscopy or colonoscopy in the past.  Plan:  This patient has profound anemia and will be set up for an EGD and colonoscopy for tomorrow.  Prior to that the patient will need to be transfused appropriately  and should be started on a PPI for possible peptic ulcer disease.  The patient will be given a prep tonight for the procedure tomorrow by Dr. Tobi Bastos.  The patient has been explained the plan and agrees with it.  Thank you for involving me in the care of this patient.      LOS: 0 days   Midge Minium, MD  05/08/2019, 2:06 PM Pager [phone] 7am-5pm  Check AMION for 5pm -7am coverage and on weekends   Note: This dictation was prepared with Dragon dictation along with smaller phrase technology. Any transcriptional errors that result from this process are unintentional.

## 2019-05-08 NOTE — ED Provider Notes (Addendum)
[facility] Emergency Department Provider Note   ____________________________________________    I have reviewed the triage vital signs and the nursing notes.   HISTORY  Chief Complaint Numbness     HPI Kimberly Mcdonald is a 54 y.o. female with a history as noted below who presents with complaints of weakness, shortness of breath with exertion as well as occasional lightheadedness.  In addition yesterday she noted tingling in her right hand and she reports it feels somewhat heavy although she reports normal strength in the hand.  She is never had this before.  She does report dark stools intermittently over the last month.  No fevers or chills.  No abdominal pain.  Not on blood thinners has not take anything for this.  No headache  Past Medical History:  Diagnosis Date  . Allergy    seasonal  . Anemia due to blood loss 05/08/2019  . Arthritis    knees and back  . Hypertension   . Substance abuse (HCC)    Marijuana smoker x35 yrs    Patient Active Problem List   Diagnosis Date Noted  . Right arm numbness 05/08/2019  . GIB (gastrointestinal bleeding) 05/08/2019  . Anemia due to blood loss 05/08/2019  . Hypertension 09/03/2017  . Arthritis 09/03/2017  . Healthcare maintenance 09/03/2017  . Low back pain 09/03/2017    Past Surgical History:  Procedure Laterality Date  . TUBAL LIGATION      Prior to Admission medications   Not on File     Allergies Patient has no known allergies.  Family History  Problem Relation Age of Onset  . Diabetes Mother   . Hypertension Mother   . Cancer Father   . Alcohol abuse Sister     Social History Social History   Tobacco Use  . Smoking status: Current Some Day Smoker    Packs/day: 0.70    Types: Cigarettes  . Smokeless tobacco: Never Used  Substance Use Topics  . Alcohol use: Yes    Alcohol/week: 3.0 standard drinks    Types: 3 Cans of beer per week  . Drug use: Yes    Frequency:  7.0 times per week    Types: Marijuana    Review of Systems  Constitutional: No fever/chills Eyes: No visual changes.  ENT: No sore throat. Cardiovascular: Denies chest pain. Respiratory: Denies shortness of breath. Gastrointestinal: No nausea or vomiting, dark stools as above Genitourinary: Negative for dysuria. Musculoskeletal: Negative for back pain. Skin: Negative for rash. Neurological: As above   ____________________________________________   PHYSICAL EXAM:  VITAL SIGNS: ED Triage Vitals  Enc Vitals Group     BP 05/08/19 1203 (!) 149/94     Pulse Rate 05/08/19 1203 98     Resp 05/08/19 1203 18     Temp 05/08/19 1203 98.1 F (36.7 C)     Temp Source 05/08/19 1203 Oral     SpO2 05/08/19 1203 94 %     Weight 05/08/19 1204 90.7 kg (200 lb)     Height 05/08/19 1204 1.626 m (5\' 4" )     Head Circumference --      Peak Flow --      Pain Score 05/08/19 1204 0     Pain Loc --      Pain Edu? --      Excl. in GC? --     Constitutional: Alert and oriented.  Eyes: Conjunctivae are normal.  PERRLA, EOMI Head: Atraumatic. Nose: No congestion/rhinnorhea. Mouth/Throat:  Mucous membranes are moist.    Cardiovascular: Normal rate, regular rhythm.  Good peripheral circulation. Respiratory: Normal respiratory effort.  No retractions. Lungs CTAB. Gastrointestinal: Soft and nontender. No distention.  No CVA tenderness.  Musculoskeletal: No lower extremity tenderness nor edema.  Warm and well perfused.  Strength appears normal in all extremities. Neurologic:  Normal speech and language. No gross focal neurologic deficits are appreciated.  Cranial nerves II through XII are normal Skin:  Skin is warm, dry and intact. No rash noted. Psychiatric: Mood and affect are normal. Speech and behavior are normal.  ____________________________________________   LABS (all labs ordered are listed, but only abnormal results are displayed)  Labs Reviewed  CBC - Abnormal; Notable for the  following components:      Result Value   RBC 2.55 (*)    Hemoglobin 4.0 (*)    HCT 16.0 (*)    MCV 62.7 (*)    MCH 15.7 (*)    MCHC 25.0 (*)    RDW 24.7 (*)    Platelets 428 (*)    All other components within normal limits  COMPREHENSIVE METABOLIC PANEL - Abnormal; Notable for the following components:   Potassium 3.2 (*)    Glucose, Bld 109 (*)    AST 11 (*)    All other components within normal limits  RESPIRATORY PANEL BY RT PCR (FLU A&B, COVID)  PROTIME-INR  APTT  DIFFERENTIAL  CBC  CBC  CBC  VITAMIN B12  FOLATE  IRON AND TIBC  FERRITIN  RETICULOCYTES  I-STAT CREATININE, ED  CBG MONITORING, ED  POC URINE PREG, ED  TYPE AND SCREEN  PREPARE RBC (CROSSMATCH)  ABO/RH   ____________________________________________  EKG  ED ECG REPORT I, Lavonia Drafts, the attending physician, personally viewed and interpreted this ECG.  Date: 05/08/2019  Rhythm: normal sinus rhythm QRS Axis: normal Intervals: normal ST/T Wave abnormalities: Nonspecific changes Narrative Interpretation: no evidence of acute ischemia  ____________________________________________  RADIOLOGY  CT head indeterminate age lacunar infarct ____________________________________________   PROCEDURES  Procedure(s) performed: No  Procedures   Critical Care performed: yes  CRITICAL CARE Performed by: Lavonia Drafts   Total critical care time: 30 minutes  Critical care time was exclusive of separately billable procedures and treating other patients.  Critical care was necessary to treat or prevent imminent or life-threatening deterioration.  Critical care was time spent personally by me on the following activities: development of treatment plan with patient and/or surrogate as well as nursing, discussions with consultants, evaluation of patient's response to treatment, examination of patient, obtaining history from patient or surrogate, ordering and performing treatments and interventions,  ordering and review of laboratory studies, ordering and review of radiographic studies, pulse oximetry and re-evaluation of patient's condition.  ____________________________________________   INITIAL IMPRESSION / ASSESSMENT AND PLAN / ED COURSE  Pertinent labs & imaging results that were available during my care of the patient were reviewed by me and considered in my medical decision making (see chart for details).  Patient presents with complaints of strange sensation in her right hand.  Also reports dark stools over the last month with shortness of breath, chest discomfort with exertion.  Concern for possible CVA given right hand "heaviness however strength appears quite normal currently.  Patient is pale appearing and feels weak and dizzy with exertion.  Hemoglobin found to be 4.0, last hemoglobin was over 11 in 2019.  I suspect the patient has been having GI bleeding which is made her severely anemic.  This may  have contributed to a possible CVA as well.  CT scan demonstrates age-indeterminate lacunar infarcts.  She will require MRI and further work-up.  I will transfuse her 2 units of PRBCs I have discussed with the hospitalist for admission  Discussed with Dr. Servando Snare of GI he will do colonoscopy and endoscopy tomorrow    ____________________________________________   FINAL CLINICAL IMPRESSION(S) / ED DIAGNOSES  Final diagnoses:  Gastrointestinal hemorrhage, unspecified gastrointestinal hemorrhage type  Symptomatic anemia        Note:  This document was prepared using Dragon voice recognition software and may include unintentional dictation errors.   Jene Every, MD 05/08/19 1421    Jene Every, MD 05/08/19 [phone]

## 2019-05-09 ENCOUNTER — Encounter: Payer: Self-pay | Admitting: Anesthesiology

## 2019-05-09 ENCOUNTER — Inpatient Hospital Stay: Payer: Self-pay

## 2019-05-09 ENCOUNTER — Encounter
Admission: EM | Disposition: A | Discharge status: Home / Self Care | Payer: Self-pay | Source: Home / Self Care | Attending: Internal Medicine

## 2019-05-09 ENCOUNTER — Inpatient Hospital Stay (HOSPITAL_COMMUNITY)
Admit: 2019-05-09 | Discharge: 2019-05-09 | Disposition: A | Discharge status: Home / Self Care | Payer: Disability Insurance | Attending: Neurology | Admitting: Neurology

## 2019-05-09 ENCOUNTER — Encounter: Payer: Self-pay | Admitting: Internal Medicine

## 2019-05-09 DIAGNOSIS — I10 Essential (primary) hypertension: Secondary | ICD-10-CM

## 2019-05-09 DIAGNOSIS — D508 Other iron deficiency anemias: Secondary | ICD-10-CM

## 2019-05-09 DIAGNOSIS — D500 Iron deficiency anemia secondary to blood loss (chronic): Secondary | ICD-10-CM

## 2019-05-09 DIAGNOSIS — I639 Cerebral infarction, unspecified: Secondary | ICD-10-CM

## 2019-05-09 DIAGNOSIS — I340 Nonrheumatic mitral (valve) insufficiency: Secondary | ICD-10-CM

## 2019-05-09 DIAGNOSIS — E538 Deficiency of other specified B group vitamins: Secondary | ICD-10-CM

## 2019-05-09 DIAGNOSIS — Z720 Tobacco use: Secondary | ICD-10-CM

## 2019-05-09 DIAGNOSIS — I361 Nonrheumatic tricuspid (valve) insufficiency: Secondary | ICD-10-CM

## 2019-05-09 LAB — URINALYSIS, ROUTINE W REFLEX MICROSCOPIC
Bilirubin Urine: NEGATIVE
Glucose, UA: NEGATIVE mg/dL
Hgb urine dipstick: NEGATIVE
Ketones, ur: 5 mg/dL — AB
Leukocytes,Ua: NEGATIVE
Nitrite: NEGATIVE
Protein, ur: NEGATIVE mg/dL
Specific Gravity, Urine: 1.034 — ABNORMAL HIGH (ref 1.005–1.030)
pH: 7 (ref 5.0–8.0)

## 2019-05-09 LAB — CBC
HCT: 21.3 % — ABNORMAL LOW (ref 36.0–46.0)
HCT: 24.4 % — ABNORMAL LOW (ref 36.0–46.0)
HCT: 24.9 % — ABNORMAL LOW (ref 36.0–46.0)
Hemoglobin: 6.1 g/dL — ABNORMAL LOW (ref 12.0–15.0)
Hemoglobin: 7.4 g/dL — ABNORMAL LOW (ref 12.0–15.0)
Hemoglobin: 7.7 g/dL — ABNORMAL LOW (ref 12.0–15.0)
MCH: 20.7 pg — ABNORMAL LOW (ref 26.0–34.0)
MCH: 21.8 pg — ABNORMAL LOW (ref 26.0–34.0)
MCH: 22.2 pg — ABNORMAL LOW (ref 26.0–34.0)
MCHC: 28.6 g/dL — ABNORMAL LOW (ref 30.0–36.0)
MCHC: 30.3 g/dL (ref 30.0–36.0)
MCHC: 30.9 g/dL (ref 30.0–36.0)
MCV: 72.2 fL — ABNORMAL LOW (ref 80.0–100.0)
MCV: 72 fL — ABNORMAL LOW (ref 80.0–100.0)
MCV: 71.8 fL — ABNORMAL LOW (ref 80.0–100.0)
Platelets: 251 10*3/uL (ref 150–400)
Platelets: 274 10*3/uL (ref 150–400)
Platelets: 275 10*3/uL (ref 150–400)
RBC: 2.95 MIL/uL — ABNORMAL LOW (ref 3.87–5.11)
RBC: 3.39 MIL/uL — ABNORMAL LOW (ref 3.87–5.11)
RBC: 3.47 MIL/uL — ABNORMAL LOW (ref 3.87–5.11)
RDW: 27.6 % — ABNORMAL HIGH (ref 11.5–15.5)
RDW: 27.4 % — ABNORMAL HIGH (ref 11.5–15.5)
RDW: 27.1 % — ABNORMAL HIGH (ref 11.5–15.5)
WBC: 7.1 10*3/uL (ref 4.0–10.5)
WBC: 7.1 10*3/uL (ref 4.0–10.5)
WBC: 7.3 10*3/uL (ref 4.0–10.5)
nRBC: 0 % (ref 0.0–0.2)
nRBC: 0 % (ref 0.0–0.2)
nRBC: 0 % (ref 0.0–0.2)

## 2019-05-09 LAB — MAGNESIUM: Magnesium: 2 mg/dL (ref 1.7–2.4)

## 2019-05-09 LAB — LIPID PANEL
Cholesterol: 118 mg/dL (ref 0–200)
HDL: 25 mg/dL — ABNORMAL LOW (ref >40)
LDL Cholesterol: 80 mg/dL (ref 0–99)
Total CHOL/HDL Ratio: 4.7 RATIO
Triglycerides: 64 mg/dL (ref <150)
VLDL: 13 mg/dL (ref 0–40)

## 2019-05-09 LAB — SEDIMENTATION RATE: Sed Rate: 65 mm/hr — ABNORMAL HIGH (ref 0–30)

## 2019-05-09 LAB — HEMOGLOBIN A1C
Hgb A1c MFr Bld: 5.2 % (ref 4.8–5.6)
Mean Plasma Glucose: 102.54 mg/dL

## 2019-05-09 LAB — PREPARE RBC (CROSSMATCH)

## 2019-05-09 LAB — HIV ANTIBODY (ROUTINE TESTING W REFLEX): HIV Screen 4th Generation wRfx: NONREACTIVE

## 2019-05-09 SURGERY — ESOPHAGOGASTRODUODENOSCOPY (EGD) WITH PROPOFOL
Anesthesia: General

## 2019-05-09 MED ORDER — CYANOCOBALAMIN 500 MCG PO TABS
500.0000 ug | ORAL_TABLET | Freq: Every day | ORAL | Status: DC
  Administered 2019-05-09 – 2019-05-15 (×6): 500 ug via ORAL
  Filled 2019-05-09 (×6): qty 1

## 2019-05-09 MED ORDER — SODIUM CHLORIDE 0.9 % IV SOLN
INTRAVENOUS | Status: DC | PRN
  Administered 2019-05-09: 50 mL via INTRAVENOUS

## 2019-05-09 MED ORDER — SODIUM CHLORIDE 0.9% IV SOLUTION: Freq: Once | INTRAVENOUS | Status: AC

## 2019-05-09 MED ORDER — PANTOPRAZOLE SODIUM 40 MG IV SOLR
40.0000 mg | Freq: Two times a day (BID) | INTRAVENOUS | Status: DC
  Administered 2019-05-09 – 2019-05-12 (×6): 40 mg via INTRAVENOUS
  Filled 2019-05-09 (×6): qty 40

## 2019-05-09 MED ORDER — SODIUM CHLORIDE 0.9 % IV SOLN
200.0000 mg | INTRAVENOUS | Status: DC
  Administered 2019-05-09 – 2019-05-14 (×6): 200 mg via INTRAVENOUS
  Filled 2019-05-09 (×7): qty 10

## 2019-05-09 MED ORDER — IOHEXOL 350 MG/ML SOLN
75.0000 mL | Freq: Once | INTRAVENOUS | Status: AC | PRN
  Administered 2019-05-09: 75 mL via INTRAVENOUS

## 2019-05-09 MED ORDER — FERROUS SULFATE 325 (65 FE) MG PO TABS
325.0000 mg | ORAL_TABLET | Freq: Two times a day (BID) | ORAL | Status: DC
  Administered 2019-05-09 – 2019-05-15 (×11): 325 mg via ORAL
  Filled 2019-05-09 (×11): qty 1

## 2019-05-09 MED ORDER — CYANOCOBALAMIN 1000 MCG/ML IJ SOLN: 1000.0000 ug | Freq: Once | INTRAMUSCULAR | Status: DC

## 2019-05-09 MED ORDER — AMLODIPINE BESYLATE 10 MG PO TABS
10.0000 mg | ORAL_TABLET | Freq: Every day | ORAL | Status: DC
  Administered 2019-05-10 – 2019-05-15 (×5): 10 mg via ORAL
  Filled 2019-05-09 (×5): qty 1

## 2019-05-09 NOTE — Progress Notes (Signed)
OT Cancellation Note  Patient Details Name: Kimberly Mcdonald MRN: [id number] DOB: 12-Jul-1965   Cancelled Treatment:    Reason Eval/Treat Not Completed: Medical issues which prohibited therapy. Consult received, chart reviewed. Pt noted with Hgb 6.1 with EGD and colonoscopy pending in addition to Gastroenterology and Neurology consults pending. Will re-attempt OT evaluation at later date/time as pt is medically appropriate.   Richrd Prime, MPH, MS, OTR/L ascom [phone] 05/09/19, 8:41 AM

## 2019-05-09 NOTE — Progress Notes (Signed)
[facility]  - [place] [facility]   PATIENT NAME: Kimberly Mcdonald    MR#:  [id number]  DATE OF BIRTH:  April 29, 1965  SUBJECTIVE:  feels her right upper extremity is improving. She is wondering when she can go home. Feels better after blood transfusion  REVIEW OF SYSTEMS:   Review of Systems  Constitutional: Positive for malaise/fatigue. Negative for chills, fever and weight loss.  HENT: Negative for ear discharge, ear pain and nosebleeds.   Eyes: Negative for blurred vision, pain and discharge.  Respiratory: Negative for sputum production, shortness of breath, wheezing and stridor.   Cardiovascular: Negative for chest pain, palpitations, orthopnea and PND.  Gastrointestinal: Positive for melena. Negative for abdominal pain, diarrhea, nausea and vomiting.  Genitourinary: Negative for frequency and urgency.  Musculoskeletal: Negative for back pain and joint pain.  Neurological: Positive for focal weakness and weakness. Negative for sensory change and speech change.  Psychiatric/Behavioral: Negative for depression and hallucinations. The patient is not nervous/anxious.    Tolerating Diet:yes Tolerating PT: NO PT f/u  DRUG ALLERGIES:  No Known Allergies  VITALS:  Blood pressure (!) 169/94, pulse 82, temperature 98.3 F (36.8 C), temperature source Oral, resp. rate 18, height 5\' 4"  (1.626 m), weight 90.7 kg, last menstrual period 10/01/2016, SpO2 98 %.  PHYSICAL EXAMINATION:   Physical Exam  GENERAL:  54 y.o.-year-old patient lying in the bed with no acute distress.  EYES: Pupils equal, round, reactive to light and accommodation. No scleral icterus. Pallor+   HEENT: Head atraumatic, normocephalic. Oropharynx and nasopharynx clear.  NECK:  Supple, no jugular venous distention. No thyroid enlargement, no tenderness.  LUNGS: Normal breath sounds bilaterally, no wheezing, rales, rhonchi. No use of accessory muscles of respiration.  CARDIOVASCULAR: S1, S2 normal. No  murmurs, rubs, or gallops.  ABDOMEN: Soft, nontender, nondistended. Bowel sounds present. No organomegaly or mass.  EXTREMITIES: No cyanosis, clubbing or edema b/l.    NEUROLOGIC: Cranial nerves II through XII are intact. No focal Motor or sensory deficits b/l.  Mild Right UE weakness. Reflexes 3+ PSYCHIATRIC:  patient is alert and oriented x 3.  SKIN: No obvious rash, lesion, or ulcer.   LABORATORY PANEL:  CBC Recent Labs  Lab 05/09/19 1345  WBC 7.3  HGB 7.7*  HCT 24.9*  PLT 275    Chemistries  Recent Labs  Lab 05/08/19 1215 05/09/19 0352  NA 140  --   K 3.2*  --   CL 108  --   CO2 25  --   GLUCOSE 109*  --   BUN 8  --   CREATININE 0.55  --   CALCIUM 9.1  --   MG  --  2.0  AST 11*  --   ALT 6  --   ALKPHOS 93  --   BILITOT 0.8  --    Cardiac Enzymes No results for input(s): TROPONINI in the last 168 hours. RADIOLOGY:  CT ANGIO HEAD W OR WO CONTRAST  Result Date: 05/09/2019 CLINICAL DATA:  Right upper extremity numbness and weakness. EXAM: CT ANGIOGRAPHY HEAD AND NECK TECHNIQUE: Multidetector CT imaging of the head and neck was performed using the standard protocol during bolus administration of intravenous contrast. Multiplanar CT image reconstructions and MIPs were obtained to evaluate the vascular anatomy. Carotid stenosis measurements (when applicable) are obtained utilizing NASCET criteria, using the distal internal carotid diameter as the denominator. CONTRAST:  27mL OMNIPAQUE IOHEXOL 350 MG/ML SOLN COMPARISON:  MRI of the brain May 09, 2019 FINDINGS: CT HEAD  FINDINGS Brain: Few hypodense foci within left centrum semiovale in a watershed type distribution, corresponding to areas of restricted diffusion seen on prior MRI. No large acute territorial infarct. No intracranial hemorrhage, hydrocephalus, extra-axial collection on mass lesion. Vascular: No hyperdense vessel or unexpected calcification. Skull: Normal. Negative for fracture or focal lesion. Sinuses: Imaged  portions are clear. Orbits: No acute finding. Review of the MIP images confirms the above findings CTA NECK FINDINGS Aortic arch: Standard branching. Imaged portion shows no evidence of aneurysm or dissection. No significant stenosis of the major arch vessel origins. However, there is extreme tortuosity of the proximal major neck arteries, unusual for patient's age. Right carotid system: No evidence of dissection, stenosis or occlusion. Left carotid system: Atherosclerotic changes with soft plaque noted in the left carotid bifurcation resulting in 70% stenosis of the left ICA at the bulb. 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 Dominant left vertebral artery with increased tortuosity at the V1 segment without stenosis. Skeleton: Degenerative changes of the cervical spine, more pronounced at C5-6 and C6-7, where there is at least mild spinal canal stenosis and bilateral neural foraminal narrowing. Other neck: Negative Upper chest: Prominent right paratracheal and paraortic lymph nodes. Review of the MIP images confirms the above findings CTA HEAD FINDINGS Anterior circulation: Mild asymmetry of the intracranial internal carotid arteries with decrease caliber of the left ICA, likely related to chronic stenosis at the neck. Right A1/ACA is dominant. No vessel occlusion or aneurysm identified. Posterior circulation: Dominant left vertebral artery. Mildly hypoplastic right P1/PCA segment related to the presence of prominent right posterior communicating artery. The basilar artery and posterior cerebral arteries are otherwise unremarkable. Venous sinuses: As permitted by contrast timing, patent. Anatomic variants: Hypoplastic right vertebral artery. Review of the MIP images confirms the above findings IMPRESSION: 1. 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 2. Hypoplastic right  vertebral artery with hairline stenosis at the origin. 3. No evidence of intracranial or cervical large vessel occlusion or aneurysm. 4. Prominent right paratracheal and paraortic lymph nodes. 5. Degenerative changes of the cervical spine, more pronounced at C5-6 and C6-7, where there is at least mild spinal canal stenosis and bilateral neural foraminal narrowing. Electronically Signed   By: Baldemar Lenis M.D.   On: 05/09/2019 12:46   CT HEAD WO CONTRAST  Result Date: 05/08/2019 CLINICAL DATA:  Numbness, possible stroke EXAM: CT HEAD WITHOUT CONTRAST TECHNIQUE: Contiguous axial images were obtained from the base of the skull through the vertex without intravenous contrast. COMPARISON:  None. FINDINGS: Brain: No definite evidence of acute infarction, hemorrhage, hydrocephalus, extra-axial collection or mass lesion/mass effect. Suspect small lacunar infarctions of the left caudate head and anterior limb of the right internal capsule/adjacent corona radiata (series 2, image 14). Vascular: No hyperdense vessel or unexpected calcification. Skull: Normal. Negative for fracture or focal lesion. Sinuses/Orbits: No acute finding. Other: None. IMPRESSION: No definite acute intracranial pathology. Suspect small lacunar infarctions of the left caudate head and anterior limb of the right internal capsule/adjacent corona radiata, generally age indeterminate. Consider MRI to more sensitively assess for acute diffusion restricting infarction if indicated by neurological signs and symptoms. Electronically Signed   By: Lauralyn Primes M.D.   On: 05/08/2019 12:54   CT ANGIO NECK W OR WO CONTRAST  Result Date: 05/09/2019 CLINICAL DATA:  Right upper extremity numbness and weakness. EXAM: CT ANGIOGRAPHY HEAD AND NECK  TECHNIQUE: Multidetector CT imaging of the head and neck was performed using the standard protocol during bolus administration of intravenous contrast. Multiplanar CT image reconstructions and MIPs were  obtained to evaluate the vascular anatomy. Carotid stenosis measurements (when applicable) are obtained utilizing NASCET criteria, using the distal internal carotid diameter as the denominator. CONTRAST:  47mL OMNIPAQUE IOHEXOL 350 MG/ML SOLN COMPARISON:  MRI of the brain May 09, 2019 FINDINGS: CT HEAD FINDINGS Brain: Few hypodense foci within left centrum semiovale in a watershed type distribution, corresponding to areas of restricted diffusion seen on prior MRI. No large acute territorial infarct. No intracranial hemorrhage, hydrocephalus, extra-axial collection on mass lesion. Vascular: No hyperdense vessel or unexpected calcification. Skull: Normal. Negative for fracture or focal lesion. Sinuses: Imaged portions are clear. Orbits: No acute finding. Review of the MIP images confirms the above findings CTA NECK FINDINGS Aortic arch: Standard branching. Imaged portion shows no evidence of aneurysm or dissection. No significant stenosis of the major arch vessel origins. However, there is extreme tortuosity of the proximal major neck arteries, unusual for patient's age. Right carotid system: No evidence of dissection, stenosis or occlusion. Left carotid system: Atherosclerotic changes with soft plaque noted in the left carotid bifurcation resulting in 70% stenosis of the left ICA at the bulb. 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 Dominant left vertebral artery with increased tortuosity at the V1 segment without stenosis. Skeleton: Degenerative changes of the cervical spine, more pronounced at C5-6 and C6-7, where there is at least mild spinal canal stenosis and bilateral neural foraminal narrowing. Other neck: Negative Upper chest: Prominent right paratracheal and paraortic lymph nodes. Review of the MIP images confirms the above findings CTA HEAD FINDINGS Anterior circulation: Mild asymmetry of the intracranial internal carotid arteries with decrease caliber  of the left ICA, likely related to chronic stenosis at the neck. Right A1/ACA is dominant. No vessel occlusion or aneurysm identified. Posterior circulation: Dominant left vertebral artery. Mildly hypoplastic right P1/PCA segment related to the presence of prominent right posterior communicating artery. The basilar artery and posterior cerebral arteries are otherwise unremarkable. Venous sinuses: As permitted by contrast timing, patent. Anatomic variants: Hypoplastic right vertebral artery. Review of the MIP images confirms the above findings IMPRESSION: 1. 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 2. 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 3. No evidence of intracranial or cervical large vessel occlusion or aneurysm. 4. Prominent right paratracheal and paraortic lymph nodes. 5. Degenerative changes of the cervical spine, more pronounced at C5-6 and C6-7, where there is at least mild spinal canal stenosis and bilateral neural foraminal narrowing. Electronically Signed   By: Pedro Earls M.D.   On: 05/09/2019 12:46   MR BRAIN WO CONTRAST  Result Date: 05/09/2019 CLINICAL DATA:  Right arm numbness. EXAM: MRI HEAD WITHOUT CONTRAST TECHNIQUE: Multiplanar, multiecho pulse sequences of the brain and surrounding structures were obtained without intravenous contrast. COMPARISON:  Head CT from yesterday FINDINGS: Brain: Cluster of small cortical and subcortical foci of restricted diffusion in the high left frontal and anterior parietal lobes. No hemorrhage, hydrocephalus, or masslike finding. Chronic lacune the anterior right corona radiata. Normal brain volume. Vascular: Normal flow voids. Skull and upper cervical spine: Normal marrow signal Sinuses/Orbits: Retention cyst appearance in the midline nasopharynx. No acute finding. IMPRESSION: 1. 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 2. 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 Electronically Signed   By: Marnee Spring M.D.   On: 05/09/2019 05:11   ASSESSMENT AND PLAN:  Betrice Wanat is a 54 y.o. female with medical history significant of hypertension, substance abuse, tobacco abuse, who presents with dark stool on the right arm numbness. Patient states that she has been having dark stool which has been going on for several weeks.  She has generalized weakness  GIB (gastrointestinal bleeding) and anemia due to blood loss:  -Hgb 11.8 --> 4.0 -->3.8. --- 4 unit PRBC--7.7 -Dr. Tobi Bastos of GI is consulted.  -in the setting of new stroke G.I. plans to hold off on any luminal evaluation for few weeks -start patient on oral and IV iron -vitamin B12 orally -PPI bid -patient reports on of NSAIDs not as much as she used to take before   Anemia: iron deficiency with B12 -started on oral and IV iron -oral B12 -patient will see Dr. Smith Robert as outpatient.  Hypertension: pt is not taking Bp med.  -IV hydralazine as needed -on oral amlodipine  Right arm numbness: Etiology is not clear.  CT of head showed possible lacunar stroke -MRI brain Cluster of small acute white matter and cortical infarcts in the left MCA distribution. 2. 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 Thad Ranger. Given G.I. bleeding no antiplatelet agent at present.  Tobacco and chronic marijuana abuse: -Did counseling about importance of quitting smoking -Nicotine patch -urine drug screen pending -pt advised on abstaining from marijuana  Hypokalemia: K= 3.2 on admission. - Repleted - Mg level 2.0   DVT ppx: SCD Code Status: Full code Family Communication: patient tells me her daughter is aware. She does not want me to call her. Disposition Plan:  Anticipate discharge back to previous home environment Consults called:    G.I. Dr. Tobi Bastos, Dr. Thad Ranger neurology Admission status:  Status is: Inpatient Remains inpatient appropriate because:IV treatments appropriate due to intensity Dispo: The patient is from: Home  Anticipated d/c is to: Home  Anticipated d/c date is: 1 days  Patient currently is not medically stable to d/c. Currently being worked up for acute CVA and severe anemia.       TOTAL TIME TAKING CARE OF THIS PATIENT: *30* minutes.  >50% time spent on counselling and coordination of care  Note: This dictation was prepared with Dragon dictation along with smaller phrase technology. Any transcriptional errors that result from this process are unintentional.  Enedina Finner M.D    [facility]   CC: Primary care physician; Patient, No Pcp PerPatient ID: Kimberly Mcdonald, female   DOB: January 27, 1965, 54 y.o.   MRN: [id number]

## 2019-05-09 NOTE — Evaluation (Signed)
Occupational Therapy Evaluation Patient Details Name: Kimberly Mcdonald MRN: [id number] DOB: 1965-11-03 Today's Date: 05/09/2019    History of Present Illness 54 y.o. female with a history of HTN, anemia and substance abuse who presented on yesterday with a several week history of black stools.  Also complained of right arm numbness and weakness that started on 4/23.  Patient reports symptoms have resolved and she feels back to baseline. 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   Clinical Impression   Pt seen for OT evaluation this date. Pt was independent in all ADL and functional mobility, living in a 1 story apartment with 2 steps to enter. Pt lives with daughter who assists with transportation. Pt indep with ADL and mobility at baseline, reporting significant increase in difficulty over the few days prior to admission 2/2 weakness. Pt reports she has been unable to afford her medications for her heart and blood pressure so she stopped taking them >a couple years ago. Case mgr notified with pt's permission. Pt reports becoming easily fatigued or out of breath with minimal exertion. Pt currently requires supervision to CGA for sit to stand ADL transfers and ADL mobility due to current functional impairments (See OT Problem List below). Pt educated in energy conservation strategies including pursed lip breathing, activity pacing, home/routines modifications, work simplification, AE/DME, prioritizing of meaningful occupations, and falls prevention. Handout provided. Pt verbalized understanding and would benefit from additional skilled OT services to maximize recall and carryover of learned techniques and facilitate implementation of learned techniques into daily routines. Upon discharge, recommend [facility] services.      Follow Up Recommendations  Home health OT    Equipment Recommendations  None recommended by OT    Recommendations for Other Services        Precautions / Restrictions Precautions Precaution Comments: watch HR/RR with exertion Restrictions Weight Bearing Restrictions: No      Mobility Bed Mobility Overal bed mobility: Independent             General bed mobility comments: no difficulty  Transfers Overall transfer level: Modified independent Equipment used: None             General transfer comment: supervision for safety, no difficulty, denied lightheadedness    Balance Overall balance assessment: No apparent balance deficits (not formally assessed)                                         ADL either performed or assessed with clinical judgement   ADL Overall ADL's : Needs assistance/impaired                                       General ADL Comments: CGA for ADL transfers, supervision to [facility] for STS ADL tasks for safety. Near baseline independence, primarily limited by cardiopulmonary status     Vision Baseline Vision/History: Wears glasses Wears Glasses: Reading only Patient Visual Report: No change from baseline Vision Assessment?: No apparent visual deficits     Perception     Praxis      Pertinent Vitals/Pain Pain Assessment: No/denies pain     Hand Dominance Right   Extremity/Trunk Assessment Upper Extremity Assessment Upper Extremity Assessment: Overall WFL for tasks assessed(very minimal shoulder flexion difference with R shoulder flexion grossly 4/5 vs 4+/5 LUE,  no sensory or coordination deficits)   Lower Extremity Assessment Lower Extremity Assessment: Overall WFL for tasks assessed   Cervical / Trunk Assessment Cervical / Trunk Assessment: Normal   Communication Communication Communication: No difficulties   Cognition Arousal/Alertness: Awake/alert Behavior During Therapy: WFL for tasks assessed/performed Overall Cognitive Status: Within Functional Limits for tasks assessed                                     General  Comments  Heart monitor indicating Vtach and non-sustained Vtach briefly with initial standing and then initial sit>supine, improved quickly. RR noted to jump to low to mid 40's briefly with exertion, comes back down quickly. RN notified.    Exercises Other Exercises Other Exercises: Pt educated in energy conservation strategies to support recovery and minimize SOB/over exertion with activity. HAndout provided to support recall and carryover. Pt verbalized understanding, appreciative for education.   Shoulder Instructions      Home Living Family/patient expects to be discharged to:: Private residence Living Arrangements: Children Available Help at Discharge: Family Type of Home: Apartment Home Access: Stairs to enter Secretary/administrator of Steps: 2 Entrance Stairs-Rails: None Home Layout: One level     Bathroom Shower/Tub: Chief Strategy Officer: Standard     Home Equipment: Environmental consultant - 2 wheels;Gilmer Mor - single point      Lives With: Daughter    Prior Functioning/Environment Level of Independence: Needs assistance  Gait / Transfers Assistance Needed: indep with mobility ADL's / Homemaking Assistance Needed: mod indep with bathing, dressing, meal prep and cleaning. Dtr drives. Pt reports unable to take medications for a few years due to cost (notified case mgr), denies falls            OT Problem List: Decreased strength;Cardiopulmonary status limiting activity;Decreased activity tolerance      OT Treatment/Interventions: Self-care/ADL training;Therapeutic exercise;Therapeutic activities;Energy conservation;DME and/or AE instruction;Patient/family education    OT Goals(Current goals can be found in the care plan section) Acute Rehab OT Goals Patient Stated Goal: to get my energy back OT Goal Formulation: With patient Time For Goal Achievement: 05/23/19 Potential to Achieve Goals: Good ADL Goals Pt Will Transfer to Toilet: with modified  independence;ambulating(LRAD for amb, no SOB) Additional ADL Goal #1: Pt will perform seated bathing and dressing implementing learned ECS to support recovery and minimize SOB. Additional ADL Goal #2: Pt will verbalize plan to implement at least 2 learned energy conservation strategies to maximize safety/indep in the home.  OT Frequency: Min 1X/week   Barriers to D/C:            Co-evaluation              AM-PAC OT "6 Clicks" Daily Activity     Outcome Measure Help from another person eating meals?: None Help from another person taking care of personal grooming?: None Help from another person toileting, which includes using toliet, bedpan, or urinal?: A Little Help from another person bathing (including washing, rinsing, drying)?: A Little Help from another person to put on and taking off regular upper body clothing?: None Help from another person to put on and taking off regular lower body clothing?: A Little 6 Click Score: 21   End of Session Nurse Communication: Other (comment)(vitals with exertion, IV completed)  Activity Tolerance: Patient tolerated treatment well Patient left: in bed;with call bell/phone within reach  OT Visit Diagnosis: Other abnormalities of gait and  mobility (R26.89)                Time: 7494-4967 OT Time Calculation (min): 21 min Charges:  OT General Charges $OT Visit: 1 Visit OT Evaluation $OT Eval Moderate Complexity: 1 Mod OT Treatments $Self Care/Home Management : 8-22 mins  Richrd Prime, MPH, MS, OTR/L ascom [phone] 05/09/19, 2:48 PM

## 2019-05-09 NOTE — Progress Notes (Signed)
Kimberly Mcdonald , MD [address], [place], [place], [address] [address], [address], [place], [place], [address] Phone: [phone]  Fax: [phone]   Kimberly Mcdonald is being followed for severe iron and B12 deficiency anemia.  Day 1 of follow up   Subjective: Denies any complaints.  Denies any overt blood loss today.  She states that she has had significant menorrhagia that stopped a few months back .  Objective: Vital signs in last 24 hours: Vitals:   05/09/19 0210 05/09/19 0515 05/09/19 0541 05/09/19 0757  BP:  (!) 150/89 (!) 149/86 (!) 154/96  Pulse:  78 81 79  Resp:  20 16 16   Temp: 98 F (36.7 C) 98.4 F (36.9 C) 98.3 F (36.8 C) 97.8 F (36.6 C)  TempSrc: Oral Oral Oral Oral  SpO2:  98% 97% 98%  Weight:      Height:       Weight change:   Intake/Output Summary (Last 24 hours) at 05/09/2019 1245 Last data filed at 05/09/2019 0957 Gross per 24 hour  Intake 3073.93 ml  Output 600 ml  Net 2473.93 ml     Exam: Heart:: Regular rate and rhythm, S1S2 present or without murmur or extra heart sounds Lungs: normal, clear to auscultation and clear to auscultation and percussion Abdomen: soft, nontender, normal bowel sounds   Lab Results: @LABTEST2 @ Micro Results: Recent Results (from the past 240 hour(s))  Respiratory Panel by RT PCR (Flu A&B, Covid) - Nasopharyngeal Swab     Status: None   Collection Time: 05/08/19  1:22 PM   Specimen: Nasopharyngeal Swab  Result Value Ref Range Status   SARS Coronavirus 2 by RT PCR NEGATIVE NEGATIVE Final    Comment: (NOTE) SARS-CoV-2 target nucleic acids are NOT DETECTED. The SARS-CoV-2 RNA is generally detectable in upper respiratoy specimens during the acute phase of infection. The lowest concentration of SARS-CoV-2 viral copies this assay can detect is 131 copies/mL. A negative result does not preclude SARS-Cov-2 infection and should not be used as the sole basis for treatment or other patient  management decisions. A negative result may occur with  improper specimen collection/handling, submission of specimen other than nasopharyngeal swab, presence of viral mutation(s) within the areas targeted by this assay, and inadequate number of viral copies (<131 copies/mL). A negative result must be combined with clinical observations, patient history, and epidemiological information. The expected result is Negative. Fact Sheet for Patients:  Fact Sheet for Healthcare Providers:  05/10/19 This test is not yet ap proved or cleared by the https://www.moore.com/ FDA and  has been authorized for detection and/or diagnosis of SARS-CoV-2 by FDA under an Emergency Use Authorization (EUA). This EUA will remain  in effect (meaning this test can be used) for the duration of the COVID-19 declaration under Section 564(b)(1) of the Act, 21 U.S.C. section 360bbb-3(b)(1), unless the authorization is terminated or revoked sooner.    Influenza A by PCR NEGATIVE NEGATIVE Final   Influenza B by PCR NEGATIVE NEGATIVE Final    Comment: (NOTE) The Xpert Xpress SARS-CoV-2/FLU/RSV assay is intended as an aid in  the diagnosis of influenza from Nasopharyngeal swab specimens and  should not be used as a sole basis for treatment. Nasal washings and  aspirates are unacceptable for Xpert Xpress SARS-CoV-2/FLU/RSV  testing. Fact Sheet for Patients: https://www.young.biz/ Fact Sheet for Healthcare Providers: [place] This test is not yet approved or cleared by the https://www.moore.com/ FDA and  has been authorized for detection and/or  diagnosis of SARS-CoV-2 by  FDA under an Emergency Use Authorization (EUA). This EUA will remain  in effect (meaning this test can be used) for the duration of the  Covid-19 declaration under Section 564(b)(1) of the Act, 21  U.S.C. section 360bbb-3(b)(1), unless the  authorization is  terminated or revoked. Performed at [facility], [address] [place], [place] [address]    Studies/Results: CT HEAD WO CONTRAST  Result Date: 05/08/2019 CLINICAL DATA:  Numbness, possible stroke EXAM: CT HEAD WITHOUT CONTRAST TECHNIQUE: Contiguous axial images were obtained from the base of the skull through the vertex without intravenous contrast. COMPARISON:  None. FINDINGS: Brain: No definite evidence of acute infarction, hemorrhage, hydrocephalus, extra-axial collection or mass lesion/mass effect. Suspect small lacunar infarctions of the left caudate head and anterior limb of the right internal capsule/adjacent corona radiata (series 2, image 14). Vascular: No hyperdense vessel or unexpected calcification. Skull: Normal. Negative for fracture or focal lesion. Sinuses/Orbits: No acute finding. Other: None. IMPRESSION: No definite acute intracranial pathology. Suspect small lacunar infarctions of the left caudate head and anterior limb of the right internal capsule/adjacent corona radiata, generally age indeterminate. Consider MRI to more sensitively assess for acute diffusion restricting infarction if indicated by neurological signs and symptoms. Electronically Signed   By: Lauralyn Primes M.D.   On: 05/08/2019 12:54   MR BRAIN WO CONTRAST  Result Date: 05/09/2019 CLINICAL DATA:  Right arm numbness. EXAM: MRI HEAD WITHOUT CONTRAST TECHNIQUE: Multiplanar, multiecho pulse sequences of the brain and surrounding structures were obtained without intravenous contrast. COMPARISON:  Head CT from yesterday FINDINGS: Brain: Cluster of small cortical and subcortical foci of restricted diffusion in the high left frontal and anterior parietal lobes. No hemorrhage, hydrocephalus, or masslike finding. Chronic lacune the anterior right corona radiata. Normal brain volume. Vascular: Normal flow voids. Skull and upper cervical spine: Normal marrow signal Sinuses/Orbits: Retention cyst  appearance in the midline nasopharynx. No acute finding. IMPRESSION: 1. 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 2. 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 Electronically Signed   By: Marnee Spring M.D.   On: 05/09/2019 05:11   Medications: I have reviewed the patient's current medications. Scheduled Meds: .  stroke: mapping our early stages of recovery book   Does not apply Once  . atorvastatin  40 mg Oral Daily  . nicotine  21 mg Transdermal Daily  . [START ON 05/12/2019] pantoprazole  40 mg Intravenous Q12H  . polyethylene glycol-electrolytes  4,000 mL Oral Once   Continuous Infusions: . sodium chloride 75 mL/hr at 05/09/19 0646  . pantoprozole (PROTONIX) infusion 8 mg/hr (05/08/19 2344)   PRN Meds:.acetaminophen **OR** acetaminophen (TYLENOL) oral liquid 160 mg/5 mL **OR** acetaminophen, hydrALAZINE, ondansetron (ZOFRAN) IV   Assessment: Principal Problem:   GIB (gastrointestinal bleeding) Active Problems:   Hypertension   Right arm numbness   Anemia due to blood loss   Tobacco abuse   Hypokalemia  Kimberly Mcdonald 54 y.o. female admitted with severe iron deficiency and B12 deficiency anemia.  History suggestive of melena ongoing for a month on admission but patient today denies any melena hematemesis or hematochezia.  She does attest to having severe menorrhagia that stopped a few months back.  Denies any NSAID use.Marland Kitchen  She was set up for an EGD and colonoscopy this morning but unfortunately developed transient neurological symptoms yesterday and had imaging of her head which demonstrated features of an acute stroke.  Seen by neurology and  further work-up has been recommended.  Plan: 1.  Replace iron and B12. 2.  If no further bleeding as an inpatient then may have to defer evaluation for an outpatient as it is usually not recommended to perform endoscopy procedures with anesthesia after an acute stroke.  Changes in the blood  pressure may extend the stroke.  I have discussed with Dr. Doy Mince and she agrees with this.  I have discussed the plan with Dr. Posey Pronto.  Check celiac serology, urine analysis. 3.  I will inform hematology to either see the patient inpatient or as an outpatient for follow-up as would require IV iron.   LOS: 1 day   Jonathon Bellows, MD 05/09/2019, 12:45 PM

## 2019-05-09 NOTE — Evaluation (Signed)
Speech Language Pathology Evaluation Patient Details Name: Kimberly Mcdonald MRN: [id number] DOB: [birth year] Today's Date: 05/09/2019 Time: 2703-5009 SLP Time Calculation (min) (ACUTE ONLY): 41 min  Problem List:  Patient Active Problem List   Diagnosis Date Noted  . Right arm numbness 05/08/2019  . GIB (gastrointestinal bleeding) 05/08/2019  . Anemia due to blood loss 05/08/2019  . Tobacco abuse 05/08/2019  . Hypokalemia 05/08/2019  . Hypertension 09/03/2017  . Arthritis 09/03/2017  . Healthcare maintenance 09/03/2017  . Low back pain 09/03/2017   Past Medical History:  Past Medical History:  Diagnosis Date  . Allergy    seasonal  . Anemia due to blood loss 05/08/2019  . Arthritis    knees and back  . Hypertension   . Substance abuse (HCC)    Marijuana smoker x35 yrs   Past Surgical History:  Past Surgical History:  Procedure Laterality Date  . TUBAL LIGATION     HPI:  Kimberly Mcdonald is a 54 y.o. female with medical history significant of hypertension, substance abuse, tobacco abuse, who presents with dark stool on the right arm numbness. MRI revealed cluster of small acute white matter and cortical infarcts in the   Assessment / Plan / Recommendation Clinical Impression  Pt presents with functional cognitive linguistic abilities that are considered to be at baseline. Currently pt is A&O x 4 with normal scores on MOCA (version 7.1). Additionally, Pt's expressive and receptive language skills are within normal limits. At baseline, pt endorses poor health literacy. This Clinical research associate made pt's nurse aware of need for further education regarding medical diagnosis and plan of care. At this time, skilled ST is not indicated.     SLP Assessment  SLP Recommendation/Assessment: Patient does not need any further Speech Lanaguage Pathology Services SLP Visit Diagnosis: Cognitive communication deficit (R41.841)    Follow Up Recommendations  None    Frequency and Duration    N/A        SLP Evaluation Cognition  Overall Cognitive Status: Within Functional Limits for tasks assessed Arousal/Alertness: Awake/alert Orientation Level: Oriented X4 Attention: Selective Selective Attention: Appears intact Memory: Appears intact Awareness: (poor health literacy)       Comprehension  Auditory Comprehension Overall Auditory Comprehension: Appears within functional limits for tasks assessed Visual Recognition/Discrimination Discrimination: Not tested Reading Comprehension Reading Status: Within funtional limits    Expression Expression Primary Mode of Expression: Verbal Verbal Expression Overall Verbal Expression: Appears within functional limits for tasks assessed Written Expression Dominant Hand: Right Written Expression: Not tested   Oral / Motor  Oral Motor/Sensory Function Overall Oral Motor/Sensory Function: Within functional limits Motor Speech Overall Motor Speech: Appears within functional limits for tasks assessed Respiration: Within functional limits Phonation: Normal Resonance: Within functional limits Articulation: Within functional limitis Intelligibility: Intelligible Motor Planning: Witnin functional limits Motor Speech Errors: Not applicable   GO            Brenten Janney B. Dreama Saa M.S., CCC-SLP, [facility] Speech-Language Pathologist Rehabilitation Services Office [phone]         Katlynn Naser Dreama Saa 05/09/2019, 10:03 AM

## 2019-05-09 NOTE — Progress Notes (Signed)
PT Cancellation Note  Patient Details Name: Kimberly Mcdonald MRN: [id number] DOB: [birth year]   Cancelled Treatment:    Reason Eval/Treat Not Completed: Other (comment). Consult received, chart reviewed. Pt noted with Hgb 6.1 with EGD and colonoscopy pending in addition to Gastroenterology and Neurology consults pending. Will re-attempt PT evaluation at later date/time as pt is medically appropriate.   Olga Coaster PT, DPT 8:44 AM,05/09/19

## 2019-05-09 NOTE — Evaluation (Signed)
Physical Therapy Evaluation Patient Details Name: Kimberly Mcdonald MRN: [id number] DOB: [birth year] Today's Date: 05/09/2019   History of Present Illness  54 y.o. female with a history of HTN, anemia and substance abuse who presented on yesterday with a several week history of black stools.  Also complained of right arm numbness and weakness that started on 4/23.  Patient reports symptoms have resolved and she feels back to baseline. 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    Clinical Impression  Pt alert, reported resolvement of symptoms. PLOF gathered from OT evaluation but pt independent at baseline.  The patient requested to use BSC, encouraged to ambulate to bathroom with PT. The patient was able to ambulate without AD and independently to commode, use mod I, and independently wash hands. The patient was able to [facility] nursing station independently. Monitored throughout to assess vitals, per monitor variable with elevated RR, PT pulse oximeter HR in 70s, spO2 98%, pt reported that she felt fine. Pt with wide BOS, lateral lean noted bilaterally, and decreased knee flexion bilaterally. Pt reported chronic issues of bilateral knees. no LOB or unsteadiness noted. Upon assessment pt did exhibit decreased R shoulder and R knee strength (though functional), stated this is a chronic issue for her, and mild dysmetria noted of RUE. PT and pt discussed following up with PCP if these mild changes impair functional activities or does not continue to improve. Overall the patient demonstrated and reported return to baseline level of functioning, no further acute PT needs indicated. PT to sign off. Please reconsult PT if pt status changes or acute needs are identified.      Follow Up Recommendations No PT follow up    Equipment Recommendations  None recommended by PT    Recommendations for Other Services       Precautions / Restrictions Precautions Precaution  Comments: watch HR/RR with exertion Restrictions Weight Bearing Restrictions: No      Mobility  Bed Mobility Overal bed mobility: Independent             General bed mobility comments: no difficulty  Transfers Overall transfer level: Independent Equipment used: None             General transfer comment: supervision for safety, no difficulty, denied lightheadedness  Ambulation/Gait Ambulation/Gait assistance: Independent Gait Distance (Feet): 190 Feet Assistive device: None     Gait velocity interpretation: >2.62 ft/sec, indicative of community ambulatory General Gait Details: Pt with wide BOS, waddling noted, pt reported chronic issues of bilateral knees. no LOB or unsteadiness noted.  Stairs            Wheelchair Mobility    Modified Rankin (Stroke Patients Only)       Balance Overall balance assessment: Independent                                           Pertinent Vitals/Pain Pain Assessment: No/denies pain    Home Living Family/patient expects to be discharged to:: Private residence Living Arrangements: Children Available Help at Discharge: Family Type of Home: Apartment Home Access: Stairs to enter Entrance Stairs-Rails: None Entrance Stairs-Number of Steps: 2 Home Layout: One level Home Equipment: Environmental consultant - 2 wheels;Cane - single point      Prior Function Level of Independence: Needs assistance   Gait / Transfers Assistance Needed: indep with mobility  ADL's / Parks  Needed: mod indep with bathing, dressing, meal prep and cleaning. Dtr drives. Pt reports unable to take medications for a few years due to cost (notified case mgr), denies falls        Hand Dominance   Dominant Hand: Right    Extremity/Trunk Assessment   Upper Extremity Assessment Upper Extremity Assessment: RUE deficits/detail;LUE deficits/detail RUE Deficits / Details: Pt with R shoulder flexion and abduction 3+/5, reported  chronic R shoulder issues. elbow and grip WFLs. Pt with mild dysmetria noted upon coordination testing as well RUE Sensation: WNL RUE Coordination: decreased gross motor LUE Deficits / Details: WFLs    Lower Extremity Assessment Lower Extremity Assessment: RLE deficits/detail;LLE deficits/detail RLE Deficits / Details: grossly 4+/5, except knee flexion, 4-/5 due to chronic R knee pain RLE Sensation: WNL RLE Coordination: WNL LLE Deficits / Details: grossly WFLs, 4+/5 LLE Sensation: WNL LLE Coordination: WNL    Cervical / Trunk Assessment Cervical / Trunk Assessment: Normal  Communication   Communication: No difficulties  Cognition Arousal/Alertness: Awake/alert Behavior During Therapy: WFL for tasks assessed/performed Overall Cognitive Status: Within Functional Limits for tasks assessed                                        General Comments General comments (skin integrity, edema, etc.): Pt vital readings variable during mobility, per pulse ox O2 9*%, HR in 70s, pt reported feeling fine. Elevated RR noted as well, pt did not appear to be SOB.    Exercises Other Exercises Other Exercises: Pt monitored throughout ambulation and mobility to assess vitals, and pt balance. Able to ambulate without physical assist (besides assistance for lines/leads). Other Exercises: Pt able to use standard commode mod I with grab bar, and wash hands independently.   Assessment/Plan    PT Assessment Patent does not need any further PT services  PT Problem List Decreased strength;Decreased range of motion;Decreased activity tolerance       PT Treatment Interventions      PT Goals (Current goals can be found in the Care Plan section)  Acute Rehab PT Goals Patient Stated Goal: to get my energy back    Frequency Other (Comment)(DC in house)   Barriers to discharge        Co-evaluation               AM-PAC PT "6 Clicks" Mobility  Outcome Measure Help needed  turning from your back to your side while in a flat bed without using bedrails?: None Help needed moving from lying on your back to sitting on the side of a flat bed without using bedrails?: None Help needed moving to and from a bed to a chair (including a wheelchair)?: None Help needed standing up from a chair using your arms (e.g., wheelchair or bedside chair)?: None Help needed to walk in hospital room?: None Help needed climbing 3-5 steps with a railing? : None 6 Click Score: 24    End of Session Equipment Utilized During Treatment: Gait belt Activity Tolerance: Patient tolerated treatment well Patient left: with call bell/phone within reach;with nursing/sitter in room;in bed Nurse Communication: Mobility status PT Visit Diagnosis: Other abnormalities of gait and mobility (R26.89)    Time: 6712-4580 PT Time Calculation (min) (ACUTE ONLY): 20 min   Charges:   PT Evaluation $PT Eval Low Complexity: 1 Low PT Treatments $Therapeutic Activity: 8-22 mins        Lafonda Mosses  Orvan Falconer PT, DPT [phone] PM,05/09/19

## 2019-05-09 NOTE — Consult Note (Signed)
Requesting Physician: Posey Pronto    Chief Complaint: Right arm numbness and weakness  I have been asked by Dr. Posey Pronto to see this patient in consultation for stroke.  HPI: Kimberly Mcdonald is an 54 y.o. female with a history of HTN, anemia and substance abuse who presented on yesterday with a several week history of black stools.  Also complained of right arm numbness and weakness that started on 4/23.  Patient reports this morning that symptoms have resolved and she feels back to baseline.  Initial NIHSS of 1.  Date last known well: 05/06/2019 Time last known well: Time: 20:00 tPA Given: No: Outside time window, GIB  Past Medical History:  Diagnosis Date  . Allergy    seasonal  . Anemia due to blood loss 05/08/2019  . Arthritis    knees and back  . Hypertension   . Substance abuse ([place])    Marijuana smoker x35 yrs    Past Surgical History:  Procedure Laterality Date  . TUBAL LIGATION      Family History  Problem Relation Age of Onset  . Diabetes Mother   . Hypertension Mother   . Cancer Father   . Alcohol abuse Sister    Social History:  reports that she has been smoking cigarettes. She has been smoking about 0.70 packs per day. She has never used smokeless tobacco. She reports current alcohol use of about 3.0 standard drinks of alcohol per week. She reports current drug use. Frequency: 7.00 times per week. Drug: Marijuana.  Allergies: No Known Allergies  Medications:  I have reviewed the patient's current medications. Prior to Admission:  No medications prior to admission.   Scheduled: .  stroke: mapping our early stages of recovery book   Does not apply Once  . atorvastatin  40 mg Oral Daily  . nicotine  21 mg Transdermal Daily  . [START ON 05/12/2019] pantoprazole  40 mg Intravenous Q12H  . polyethylene glycol-electrolytes  4,000 mL Oral Once    ROS: History obtained from the patient  General ROS: negative for - chills, fatigue, fever, night sweats, weight  gain or weight loss Psychological ROS: negative for - behavioral disorder, hallucinations, memory difficulties, mood swings or suicidal ideation Ophthalmic ROS: negative for - blurry vision, double vision, eye pain or loss of vision ENT ROS: negative for - epistaxis, nasal discharge, oral lesions, sore throat, tinnitus or vertigo Allergy and Immunology ROS: negative for - hives or itchy/watery eyes Hematological and Lymphatic ROS: negative for - bleeding problems, bruising or swollen lymph nodes Endocrine ROS: negative for - galactorrhea, hair pattern changes, polydipsia/polyuria or temperature intolerance Respiratory ROS: negative for - cough, hemoptysis, shortness of breath or wheezing Cardiovascular ROS: negative for - chest pain, dyspnea on exertion, edema or irregular heartbeat Gastrointestinal ROS: as noted in HPI Genito-Urinary ROS: negative for - dysuria, hematuria, incontinence or urinary frequency/urgency Musculoskeletal ROS: bilateral knee pain Neurological ROS: as noted in HPI Dermatological ROS: negative for rash and skin lesion changes  Physical Examination: Blood pressure (!) 154/96, pulse 79, temperature 97.8 F (36.6 C), temperature source Oral, resp. rate 16, height 5' 4"  (1.626 m), weight 90.7 kg, last menstrual period 10/01/2016, SpO2 98 %.  HEENT-  Normocephalic, no lesions, without obvious abnormality.  Normal external eye and conjunctiva.  Normal TM's bilaterally.  Normal auditory canals and external ears. Normal external nose, mucus membranes and septum.  Normal pharynx. Cardiovascular- S1, S2 normal, pulses palpable throughout   Lungs- chest clear, no wheezing, rales, normal symmetric air  entry Abdomen- soft, non-tender; bowel sounds normal; no masses,  no organomegaly Extremities- no edema Lymph-no adenopathy palpable Musculoskeletal-no joint tenderness, deformity or swelling Skin-warm and dry, no hyperpigmentation, vitiligo, or suspicious lesions  Neurological  Examination   Mental Status: Alert, oriented, thought content appropriate.  Speech fluent without evidence of aphasia.  Able to follow 3 step commands without difficulty. Cranial Nerves: II: Visual fields grossly normal, pupils equal, round, reactive to light and accommodation III,IV, VI: ptosis not present, extra-ocular motions intact bilaterally V,VII: smile symmetric, facial light touch sensation normal bilaterally VIII: hearing normal bilaterally IX,X: gag reflex present XI: bilateral shoulder shrug XII: midline tongue extension Motor: Right : Upper extremity   5/5    Left:     Upper extremity   5/5  Lower extremity   5/5     Lower extremity   5/5 Tone and bulk:normal tone throughout; no atrophy noted Sensory: Pinprick and light touch intact throughout, bilaterally Deep Tendon Reflexes: Symmetric throughout Plantars: Right: downgoing   Left: downgoing Cerebellar: Normal finger-to-nose and normal heel-to-shin testing bilaterally Gait: not tested due to safety concerns    Laboratory Studies:  Basic Metabolic Panel: Recent Labs  Lab 05/08/19 1215 05/09/19 0352  NA 140  --   K 3.2*  --   CL 108  --   CO2 25  --   GLUCOSE 109*  --   BUN 8  --   CREATININE 0.55  --   CALCIUM 9.1  --   MG  --  2.0    Liver Function Tests: Recent Labs  Lab 05/08/19 1215  AST 11*  ALT 6  ALKPHOS 93  BILITOT 0.8  PROT 7.8  ALBUMIN 3.6   No results for input(s): LIPASE, AMYLASE in the last 168 hours. No results for input(s): AMMONIA in the last 168 hours.  CBC: Recent Labs  Lab 05/08/19 1215 05/08/19 1427 05/09/19 0352 05/09/19 0904  WBC 9.1 9.1 7.1 7.1  NEUTROABS 6.6  --   --   --   HGB 4.0* 3.8* 6.1* 7.4*  HCT 16.0* 15.2* 21.3* 24.4*  MCV 62.7* 62.0* 72.2* 72.0*  PLT 428* 341 251 274    Cardiac Enzymes: No results for input(s): CKTOTAL, CKMB, CKMBINDEX, TROPONINI in the last 168 hours.  BNP: Invalid input(s): POCBNP  CBG: No results for input(s): GLUCAP in the  last 168 hours.  Microbiology: Results for orders placed or performed during the hospital encounter of 05/08/19  Respiratory Panel by RT PCR (Flu A&B, Covid) - Nasopharyngeal Swab     Status: None   Collection Time: 05/08/19  1:22 PM   Specimen: Nasopharyngeal Swab  Result Value Ref Range Status   SARS Coronavirus 2 by RT PCR NEGATIVE NEGATIVE Final    Comment: (NOTE) SARS-CoV-2 target nucleic acids are NOT DETECTED. The SARS-CoV-2 RNA is generally detectable in upper respiratoy specimens during the acute phase of infection. The lowest concentration of SARS-CoV-2 viral copies this assay can detect is 131 copies/mL. A negative result does not preclude SARS-Cov-2 infection and should not be used as the sole basis for treatment or other patient management decisions. A negative result may occur with  improper specimen collection/handling, submission of specimen other than nasopharyngeal swab, presence of viral mutation(s) within the areas targeted by this assay, and inadequate number of viral copies (<131 copies/mL). A negative result must be combined with clinical observations, patient history, and epidemiological information. The expected result is Negative. Fact Sheet for Patients:  PinkCheek.be Fact Sheet for Healthcare Providers:  GravelBags.it This test is not yet ap proved or cleared by the [place] and  has been authorized for detection and/or diagnosis of SARS-CoV-2 by FDA under an Emergency Use Authorization (EUA). This EUA will remain  in effect (meaning this test can be used) for the duration of the COVID-19 declaration under Section 564(b)(1) of the Act, 21 U.S.C. section 360bbb-3(b)(1), unless the authorization is terminated or revoked sooner.    Influenza A by PCR NEGATIVE NEGATIVE Final   Influenza B by PCR NEGATIVE NEGATIVE Final    Comment: (NOTE) The Xpert Xpress SARS-CoV-2/FLU/RSV assay is intended  as an aid in  the diagnosis of influenza from Nasopharyngeal swab specimens and  should not be used as a sole basis for treatment. Nasal washings and  aspirates are unacceptable for Xpert Xpress SARS-CoV-2/FLU/RSV  testing. Fact Sheet for Patients: PinkCheek.be Fact Sheet for Healthcare Providers: GravelBags.it This test is not yet approved or cleared by the [place] FDA and  has been authorized for detection and/or diagnosis of SARS-CoV-2 by  FDA under an Emergency Use Authorization (EUA). This EUA will remain  in effect (meaning this test can be used) for the duration of the  Covid-19 declaration under Section 564(b)(1) of the Act, 21  U.S.C. section 360bbb-3(b)(1), unless the authorization is  terminated or revoked. Performed at [facility], [address] [place], [place] [address]     Coagulation Studies: Recent Labs    05/08/19 1215  LABPROT 13.2  INR 1.0    Urinalysis: No results for input(s): COLORURINE, LABSPEC, PHURINE, GLUCOSEU, HGBUR, BILIRUBINUR, KETONESUR, PROTEINUR, UROBILINOGEN, NITRITE, LEUKOCYTESUR in the last 168 hours.  Invalid input(s): APPERANCEUR  Lipid Panel:    Component Value Date/Time   CHOL 118 05/09/2019 0352   CHOL 186 09/03/2017 1854   TRIG 64 05/09/2019 0352   HDL 25 (L) 05/09/2019 0352   HDL 53 09/03/2017 1854   CHOLHDL 4.7 05/09/2019 0352   VLDL 13 05/09/2019 0352   LDLCALC 80 05/09/2019 0352   LDLCALC 115 (H) 09/03/2017 1854    HgbA1C:  Lab Results  Component Value Date   HGBA1C 5.1 09/03/2017    Urine Drug Screen:  No results found for: LABOPIA, COCAINSCRNUR, LABBENZ, AMPHETMU, THCU, LABBARB  Alcohol Level: No results for input(s): ETH in the last 168 hours.  Other results: EKG: normal sinus rhythm at 97 bpm.  Imaging: CT HEAD WO CONTRAST  Result Date: 05/08/2019 CLINICAL DATA:  Numbness, possible stroke EXAM: CT HEAD WITHOUT CONTRAST TECHNIQUE:  Contiguous axial images were obtained from the base of the skull through the vertex without intravenous contrast. COMPARISON:  None. FINDINGS: Brain: No definite evidence of acute infarction, hemorrhage, hydrocephalus, extra-axial collection or mass lesion/mass effect. Suspect small lacunar infarctions of the left caudate head and anterior limb of the right internal capsule/adjacent corona radiata (series 2, image 14). Vascular: No hyperdense vessel or unexpected calcification. Skull: Normal. Negative for fracture or focal lesion. Sinuses/Orbits: No acute finding. Other: None. IMPRESSION: No definite acute intracranial pathology. Suspect small lacunar infarctions of the left caudate head and anterior limb of the right internal capsule/adjacent corona radiata, generally age indeterminate. Consider MRI to more sensitively assess for acute diffusion restricting infarction if indicated by neurological signs and symptoms. Electronically Signed   By: Eddie Candle M.D.   On: 05/08/2019 12:54   MR BRAIN WO CONTRAST  Result Date: 05/09/2019 CLINICAL DATA:  Right arm numbness. EXAM: MRI HEAD WITHOUT CONTRAST TECHNIQUE: Multiplanar, multiecho pulse sequences of the brain and surrounding  structures were obtained without intravenous contrast. COMPARISON:  Head CT from yesterday FINDINGS: Brain: Cluster of small cortical and subcortical foci of restricted diffusion in the high left frontal and anterior parietal lobes. No hemorrhage, hydrocephalus, or masslike finding. Chronic lacune the anterior right corona radiata. Normal brain volume. Vascular: Normal flow voids. Skull and upper cervical spine: Normal marrow signal Sinuses/Orbits: Retention cyst appearance in the midline nasopharynx. No acute finding. IMPRESSION: 1. 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 2. 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 Electronically Signed   By: Monte Fantasia M.D.   On: 05/09/2019 05:11     Assessment: 54 y.o. female with a history of HTN and tobacco abuse who presents with GIB and complaints of right upper extremity numbness and weakness.  Symptoms now resolved.  Patient with no prior history of stroke.  On no antiplatelet or anticoagulation prior to presentation.  MRI of the brain personally reviewed and shows multiple small, acute infarcts in the left MCA distribution.  Suspect embolic etiology from either large vessel atherosclerotic or cardiac source.  Further work up recommended.  UDS pending.    Stroke Risk Factors - hypertension and smoking  Plan: 1. HgbA1c, fasting lipid panel, hypercoag panel, ESR 2. CTA of the head and neck 3. PT consult, OT consult, Speech consult 4. Echocardiogram with bubble study 5. Prophylactic therapy-None at this time due to GIB 6. Telemetry monitoring 7. Frequent neuro checks 8. Smoking cessation counseling 9. BP control with target BP<140/80   Alexis Goodell, MD Neurology [phone] 05/09/2019, 10:46 AM

## 2019-05-10 DIAGNOSIS — K922 Gastrointestinal hemorrhage, unspecified: Secondary | ICD-10-CM

## 2019-05-10 DIAGNOSIS — D649 Anemia, unspecified: Secondary | ICD-10-CM

## 2019-05-10 LAB — BPAM RBC
Blood Product Expiration Date: 202105192359
Blood Product Expiration Date: 202105282359
Blood Product Expiration Date: 202106012359
Blood Product Expiration Date: 202106012359
ISSUE DATE / TIME: 202104251455
ISSUE DATE / TIME: 202104251801
ISSUE DATE / TIME: 202104252330
ISSUE DATE / TIME: 202104260521
Unit Type and Rh: 5100
Unit Type and Rh: 5100
Unit Type and Rh: 5100
Unit Type and Rh: 5100

## 2019-05-10 LAB — HOMOCYSTEINE: Homocysteine: 19.2 umol/L — ABNORMAL HIGH (ref 0.0–14.5)

## 2019-05-10 LAB — PROTEIN S, TOTAL: Protein S Ag, Total: 84 % (ref 60–150)

## 2019-05-10 LAB — ANTITHROMBIN III: AntiThromb III Func: 102 % (ref 75–120)

## 2019-05-10 LAB — URINE DRUG SCREEN, QUALITATIVE (ARMC ONLY)
Amphetamines, Ur Screen: NOT DETECTED
Barbiturates, Ur Screen: NOT DETECTED
Benzodiazepine, Ur Scrn: NOT DETECTED
Cannabinoid 50 Ng, Ur ~~LOC~~: POSITIVE — AB
Cocaine Metabolite,Ur ~~LOC~~: NOT DETECTED
MDMA (Ecstasy)Ur Screen: NOT DETECTED
Methadone Scn, Ur: NOT DETECTED
Opiate, Ur Screen: NOT DETECTED
Phencyclidine (PCP) Ur S: NOT DETECTED
Tricyclic, Ur Screen: NOT DETECTED

## 2019-05-10 LAB — TYPE AND SCREEN
ABO/RH(D): O POS
Antibody Screen: NEGATIVE
Unit division: 0
Unit division: 0
Unit division: 0
Unit division: 0

## 2019-05-10 LAB — PROTEIN C, TOTAL: Protein C, Total: 110 % (ref 60–150)

## 2019-05-10 LAB — PROTEIN S ACTIVITY: Protein S Activity: 48 % — ABNORMAL LOW (ref 63–140)

## 2019-05-10 LAB — BETA-2-GLYCOPROTEIN I ABS, IGG/M/A
Beta-2 Glyco I IgG: <9 GPI IgG units (ref 0–20)
Beta-2-Glycoprotein I IgA: <9 GPI IgA units (ref 0–25)
Beta-2-Glycoprotein I IgM: <9 GPI IgM units (ref 0–32)

## 2019-05-10 LAB — LUPUS ANTICOAGULANT PANEL
DRVVT: 30 s (ref 0.0–47.0)
PTT Lupus Anticoagulant: 28 s (ref 0.0–51.9)

## 2019-05-10 LAB — VITAMIN B12: Vitamin B-12: 212 pg/mL (ref 180–914)

## 2019-05-10 LAB — CARDIOLIPIN ANTIBODIES, IGG, IGM, IGA
Anticardiolipin IgA: <9 APL U/mL (ref 0–11)
Anticardiolipin IgG: <9 GPL U/mL (ref 0–14)
Anticardiolipin IgM: 10 MPL U/mL (ref 0–12)

## 2019-05-10 LAB — PROTEIN C ACTIVITY: Protein C Activity: 115 % (ref 73–180)

## 2019-05-10 NOTE — Progress Notes (Signed)
Since endoscopy procedures are not possible at this point of time due to acute CVA and carotid artery stenosis.  As long as she is not actively bleeding we will plan to perform the procedures as an outpatient when safe to do so.  Suggest IV iron and B12 supplementation.  I will sign off.  Please call me if any further GI concerns or questions.  We would like to thank you for the opportunity to participate in the care of [employer].    Dr [name] MD,MRCP [facility]) Gastroenterology/Hepatology Pager: [phone]

## 2019-05-10 NOTE — TOC Initial Note (Signed)
Transition of Care [facility]) - Initial/Assessment Note    Patient Details  Name: Kimberly Mcdonald MRN: [id number] Date of Birth: 1965-10-16  [facility] CM/SW Contact:    Shelbie Ammons, RN Phone Number: 05/10/2019, 11:21 AM  Clinical Narrative:   RNCM assessed patient at bedside. Discussed that OT had made recommendation for CM consult due to need for assistance with medication. Discussed some options for assistance including medication management clinic and [facility]. Patient reports that she has used both in the past and that she would be willing to follow up with them again. Patient reports that it is her plan to complete Medicaid application as soon as she can. Discussed with patient that medication management clinic will be contacted regarding any necessary medications to see if they would be able to fill and then she could pick them up later and at that time fill out a new application for service. Patient verbalized agreement to this plan. RNCM will contact med management clinic for meds.                 Expected Discharge Plan: Home/Self Care Barriers to Discharge: Continued Medical Work up   Patient Goals and CMS Choice        Expected Discharge Plan and Services Expected Discharge Plan: Home/Self Care       Living arrangements for the past 2 months: Single Family Home                   DME Agency: [employer] Date DME Agency Contacted: 05/10/19 Time DME Agency Contacted: 0930 Representative spoke with at DME Agency: Leroy Sea            Prior Living Arrangements/Services Living arrangements for the past 2 months: Single Family Home Lives with:: Parents Patient language and need for interpreter reviewed:: Yes        Need for Family Participation in Patient Care: Yes (Comment) Care giver support system in place?: Yes (comment)   Criminal Activity/Legal Involvement Pertinent to Current Situation/Hospitalization: No - Comment as  needed  Activities of Daily Living Home Assistive Devices/Equipment: None ADL Screening (condition at time of admission) Patient's cognitive ability adequate to safely complete daily activities?: Yes Is the patient deaf or have difficulty hearing?: No Does the patient have difficulty seeing, even when wearing glasses/contacts?: No Does the patient have difficulty concentrating, remembering, or making decisions?: No Patient able to express need for assistance with ADLs?: Yes Does the patient have difficulty dressing or bathing?: No Independently performs ADLs?: Yes (appropriate for developmental age) Does the patient have difficulty walking or climbing stairs?: Yes Weakness of Legs: Both Weakness of Arms/Hands: None  Permission Sought/Granted                  Emotional Assessment Appearance:: Appears stated age Attitude/Demeanor/Rapport: Engaged, Gracious Affect (typically observed): Appropriate, Pleasant Orientation: : Oriented to Self, Oriented to Place, Oriented to  Time, Oriented to Situation Alcohol / Substance Use: Not Applicable Psych Involvement: No (comment)  Admission diagnosis:  Right arm numbness [R20.0] Symptomatic anemia [D64.9] Gastrointestinal hemorrhage, unspecified gastrointestinal hemorrhage type [K92.2] Patient Active Problem List   Diagnosis Date Noted  . Cerebrovascular accident (CVA) ([place])   . Right arm numbness 05/08/2019  . GIB (gastrointestinal bleeding) 05/08/2019  . Symptomatic anemia 05/08/2019  . Tobacco abuse 05/08/2019  . Hypokalemia 05/08/2019  . Hypertension 09/03/2017  . Arthritis 09/03/2017  . Healthcare maintenance 09/03/2017  . Low back pain 09/03/2017   PCP:  Patient, No  Pcp Per Pharmacy:   [facility] - [place], [place] - [address] [place] [address] Phone: [phone] Fax: [phone]  [facility] #[id number] [place], [place] - [address] AT [facility] [address] [place] [address] Phone: [phone] Fax: [phone]  [id number] [place], [place] - [address] AT [facility] [address] [place] [address] Phone: [phone] Fax: [phone]     Social Determinants of Health (SDOH) Interventions    Readmission Risk Interventions No flowsheet data found.

## 2019-05-10 NOTE — Progress Notes (Signed)
Subjective: No new neurological complaints.    Objective: Current vital signs: BP (!) 156/98 (BP Location: Left Arm)   Pulse 80   Temp 98.2 F (36.8 C) (Oral)   Resp 20   Ht 5' 4"  (1.626 m)   Wt 90.7 kg   LMP 10/01/2016 (Approximate)   SpO2 98%   BMI 34.33 kg/m  Vital signs in last 24 hours: Temp:  [98.2 F (36.8 C)-99.2 F (37.3 C)] 98.2 F (36.8 C) (04/27 0836) Pulse Rate:  [78-90] 80 (04/27 0836) Resp:  [16-20] 20 (04/27 0836) BP: (137-169)/(77-98) 156/98 (04/27 0836) SpO2:  [95 %-98 %] 98 % (04/27 0836)  Intake/Output from previous day: 04/26 0701 - 04/27 0700 In: 1304.7 [I.V.:744.7; Blood:560] Out: 700 [Urine:700] Intake/Output this shift: Total I/O In: -  Out: 150 [Urine:150] Nutritional status:  Diet Order            Diet Heart Room service appropriate? Yes; Fluid consistency: Thin  Diet effective now              Neurologic Exam: Mental Status: Alert, oriented, thought content appropriate.  Speech fluent without evidence of aphasia.  Able to follow 3 step commands without difficulty. Cranial Nerves: II: Visual fields grossly normal, pupils equal, round, reactive to light and accommodation III,IV, VI: ptosis not present, extra-ocular motions intact bilaterally V,VII: smile symmetric, facial light touch sensation normal bilaterally VIII: hearing normal bilaterally IX,X: gag reflex present XI: bilateral shoulder shrug XII: midline tongue extension Motor: 5/5 throughout Sensory: Pinprick and light touch intact throughout, bilaterally  Lab Results: Basic Metabolic Panel: Recent Labs  Lab 05/08/19 1215 05/09/19 0352  NA 140  --   K 3.2*  --   CL 108  --   CO2 25  --   GLUCOSE 109*  --   BUN 8  --   CREATININE 0.55  --   CALCIUM 9.1  --   MG  --  2.0    Liver Function Tests: Recent Labs  Lab 05/08/19 1215  AST 11*  ALT 6  ALKPHOS 93  BILITOT 0.8  PROT 7.8  ALBUMIN 3.6   No results for input(s): LIPASE, AMYLASE in the last 168  hours. No results for input(s): AMMONIA in the last 168 hours.  CBC: Recent Labs  Lab 05/08/19 1215 05/08/19 1427 05/09/19 0352 05/09/19 0904 05/09/19 1345  WBC 9.1 9.1 7.1 7.1 7.3  NEUTROABS 6.6  --   --   --   --   HGB 4.0* 3.8* 6.1* 7.4* 7.7*  HCT 16.0* 15.2* 21.3* 24.4* 24.9*  MCV 62.7* 62.0* 72.2* 72.0* 71.8*  PLT 428* 341 251 274 275    Cardiac Enzymes: No results for input(s): CKTOTAL, CKMB, CKMBINDEX, TROPONINI in the last 168 hours.  Lipid Panel: Recent Labs  Lab 05/09/19 0352  CHOL 118  TRIG 64  HDL 25*  CHOLHDL 4.7  VLDL 13  LDLCALC 80    CBG: No results for input(s): GLUCAP in the last 168 hours.  Microbiology: Results for orders placed or performed during the hospital encounter of 05/08/19  Respiratory Panel by RT PCR (Flu A&B, Covid) - Nasopharyngeal Swab     Status: None   Collection Time: 05/08/19  1:22 PM   Specimen: Nasopharyngeal Swab  Result Value Ref Range Status   SARS Coronavirus 2 by RT PCR NEGATIVE NEGATIVE Final    Comment: (NOTE) SARS-CoV-2 target nucleic acids are NOT DETECTED. The SARS-CoV-2 RNA is generally detectable in upper respiratoy specimens during the acute phase  of infection. The lowest concentration of SARS-CoV-2 viral copies this assay can detect is 131 copies/mL. A negative result does not preclude SARS-Cov-2 infection and should not be used as the sole basis for treatment or other patient management decisions. A negative result may occur with  improper specimen collection/handling, submission of specimen other than nasopharyngeal swab, presence of viral mutation(s) within the areas targeted by this assay, and inadequate number of viral copies (<131 copies/mL). A negative result must be combined with clinical observations, patient history, and epidemiological information. The expected result is Negative. Fact Sheet for Patients:  PinkCheek.be Fact Sheet for Healthcare Providers:   GravelBags.it This test is not yet ap proved or cleared by the [place] FDA and  has been authorized for detection and/or diagnosis of SARS-CoV-2 by FDA under an Emergency Use Authorization (EUA). This EUA will remain  in effect (meaning this test can be used) for the duration of the COVID-19 declaration under Section 564(b)(1) of the Act, 21 U.S.C. section 360bbb-3(b)(1), unless the authorization is terminated or revoked sooner.    Influenza A by PCR NEGATIVE NEGATIVE Final   Influenza B by PCR NEGATIVE NEGATIVE Final    Comment: (NOTE) The Xpert Xpress SARS-CoV-2/FLU/RSV assay is intended as an aid in  the diagnosis of influenza from Nasopharyngeal swab specimens and  should not be used as a sole basis for treatment. Nasal washings and  aspirates are unacceptable for Xpert Xpress SARS-CoV-2/FLU/RSV  testing. Fact Sheet for Patients: PinkCheek.be Fact Sheet for Healthcare Providers: GravelBags.it This test is not yet approved or cleared by the [place] FDA and  has been authorized for detection and/or diagnosis of SARS-CoV-2 by  FDA under an Emergency Use Authorization (EUA). This EUA will remain  in effect (meaning this test can be used) for the duration of the  Covid-19 declaration under Section 564(b)(1) of the Act, 21  U.S.C. section 360bbb-3(b)(1), unless the authorization is  terminated or revoked. Performed at [facility], [address] [place],  [address]     Coagulation Studies: Recent Labs    05/08/19 1215  LABPROT 13.2  INR 1.0    Imaging: CT ANGIO HEAD W OR WO CONTRAST  Result Date: 05/09/2019 CLINICAL DATA:  Right upper extremity numbness and weakness. EXAM: CT ANGIOGRAPHY HEAD AND NECK TECHNIQUE: Multidetector CT imaging of the head and neck was performed using the standard protocol during bolus administration of intravenous contrast.  Multiplanar CT image reconstructions and MIPs were obtained to evaluate the vascular anatomy. Carotid stenosis measurements (when applicable) are obtained utilizing NASCET criteria, using the distal internal carotid diameter as the denominator. CONTRAST:  66m OMNIPAQUE IOHEXOL 350 MG/ML SOLN COMPARISON:  MRI of the brain May 09, 2019 FINDINGS: CT HEAD FINDINGS Brain: Few hypodense foci within left centrum semiovale in a watershed type distribution, corresponding to areas of restricted diffusion seen on prior MRI. No large acute territorial infarct. No intracranial hemorrhage, hydrocephalus, extra-axial collection on mass lesion. Vascular: No hyperdense vessel or unexpected calcification. Skull: Normal. Negative for fracture or focal lesion. Sinuses: Imaged portions are clear. Orbits: No acute finding. Review of the MIP images confirms the above findings CTA NECK FINDINGS Aortic arch: Standard branching. Imaged portion shows no evidence of aneurysm or dissection. No significant stenosis of the major arch vessel origins. However, there is extreme tortuosity of the proximal major neck arteries, unusual for patient's age. Right carotid system: No evidence of dissection, stenosis or occlusion. 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 Dominant left vertebral artery with increased tortuosity at the V1 segment without stenosis. Skeleton: Degenerative changes of the cervical spine, more pronounced at C5-6 and C6-7, where there is at least mild spinal canal stenosis and bilateral neural foraminal narrowing. Other neck: Negative Upper chest: Prominent right paratracheal and paraortic lymph nodes. Review of the MIP images confirms the above findings CTA HEAD FINDINGS Anterior circulation: Mild asymmetry of the  intracranial internal carotid arteries with decrease caliber of the left ICA, likely related to chronic stenosis at the neck. Right A1/ACA is dominant. No vessel occlusion or aneurysm identified. Posterior circulation: Dominant left vertebral artery. Mildly hypoplastic right P1/PCA segment related to the presence of prominent right posterior communicating artery. The basilar artery and posterior cerebral arteries are otherwise unremarkable. Venous sinuses: As permitted by contrast timing, patent. Anatomic variants: Hypoplastic right vertebral artery. Review of the MIP images confirms the above findings IMPRESSION: 1. 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 2. 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 3. No evidence of intracranial or cervical large vessel occlusion or aneurysm. 4. Prominent right paratracheal and paraortic lymph nodes. 5. Degenerative changes of the cervical spine, more pronounced at C5-6 and C6-7, where there is at least mild spinal canal stenosis and bilateral neural foraminal narrowing. Electronically Signed   By: Pedro Earls M.D.   On: 05/09/2019 12:46   CT HEAD WO CONTRAST  Result Date: 05/08/2019 CLINICAL DATA:  Numbness, possible stroke EXAM: CT HEAD WITHOUT CONTRAST TECHNIQUE: Contiguous axial images were obtained from the base of the skull through the vertex without intravenous contrast. COMPARISON:  None. FINDINGS: Brain: No definite evidence of acute infarction, hemorrhage, hydrocephalus, extra-axial collection or mass lesion/mass effect. Suspect small lacunar infarctions of the left caudate head and anterior limb of the right internal capsule/adjacent corona radiata (series 2, image 14). Vascular: No hyperdense vessel or unexpected calcification. Skull: Normal. Negative for fracture or focal lesion. Sinuses/Orbits: No acute finding. Other: None.  IMPRESSION: No definite acute intracranial pathology. Suspect small lacunar infarctions of the left caudate head and anterior limb of the right internal capsule/adjacent corona radiata, generally age indeterminate. Consider MRI to more sensitively assess for acute diffusion restricting infarction if indicated by neurological signs and symptoms. Electronically Signed   By: Eddie Candle M.D.   On: 05/08/2019 12:54   CT ANGIO NECK W OR WO CONTRAST  Result Date: 05/09/2019 CLINICAL DATA:  Right upper extremity numbness and weakness. EXAM: CT ANGIOGRAPHY HEAD AND NECK TECHNIQUE: Multidetector CT imaging of the head and neck was performed using the standard protocol during bolus administration of intravenous contrast. Multiplanar CT image reconstructions and MIPs were obtained to evaluate the vascular anatomy. Carotid stenosis measurements (when applicable) are obtained utilizing NASCET criteria, using the distal internal carotid diameter as the denominator. CONTRAST:  6m OMNIPAQUE IOHEXOL 350 MG/ML SOLN COMPARISON:  MRI of the brain May 09, 2019 FINDINGS: CT HEAD FINDINGS Brain: Few hypodense foci within left centrum semiovale in a watershed type distribution, corresponding to areas of restricted diffusion seen on prior MRI. No large acute territorial infarct. No intracranial hemorrhage, hydrocephalus, extra-axial collection on mass lesion. Vascular: No hyperdense vessel or unexpected calcification. Skull: Normal. Negative for fracture or focal lesion. Sinuses: Imaged portions are clear. Orbits:  No acute finding. Review of the MIP images confirms the above findings CTA NECK FINDINGS Aortic arch: Standard branching. Imaged portion shows no evidence of aneurysm or dissection. No significant stenosis of the major arch vessel origins. However, there is extreme tortuosity of the proximal major neck arteries, unusual for patient's age. Right carotid system: No evidence of dissection, stenosis or occlusion. 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 Dominant left vertebral artery with increased tortuosity at the V1 segment without stenosis. Skeleton: Degenerative changes of the cervical spine, more pronounced at C5-6 and C6-7, where there is at least mild spinal canal stenosis and bilateral neural foraminal narrowing. Other neck: Negative Upper chest: Prominent right paratracheal and paraortic lymph nodes. Review of the MIP images confirms the above findings CTA HEAD FINDINGS Anterior circulation: Mild asymmetry of the intracranial internal carotid arteries with decrease caliber of the left ICA, likely related to chronic stenosis at the neck. Right A1/ACA is dominant. No vessel occlusion or aneurysm identified. Posterior circulation: Dominant left vertebral artery. Mildly hypoplastic right P1/PCA segment related to the presence of prominent right posterior communicating artery. The basilar artery and posterior cerebral arteries are otherwise unremarkable. Venous sinuses: As permitted by contrast timing, patent. Anatomic variants: Hypoplastic right vertebral artery. Review of the MIP images confirms the above findings IMPRESSION: 1. 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 2. 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 3. No evidence of intracranial or cervical large vessel occlusion or aneurysm. 4. Prominent right paratracheal and paraortic lymph nodes. 5. Degenerative changes of the cervical spine, more pronounced at C5-6 and C6-7, where there is at least mild spinal canal stenosis and bilateral neural foraminal narrowing. Electronically Signed   By: Pedro Earls M.D.   On: 05/09/2019 12:46   MR  BRAIN WO CONTRAST  Result Date: 05/09/2019 CLINICAL DATA:  Right arm numbness. EXAM: MRI HEAD WITHOUT CONTRAST TECHNIQUE: Multiplanar, multiecho pulse sequences of the brain and surrounding structures were obtained without intravenous contrast. COMPARISON:  Head CT from yesterday FINDINGS: Brain: Cluster of small cortical and subcortical foci of restricted diffusion in the high left frontal and anterior parietal lobes. No hemorrhage, hydrocephalus, or masslike finding. Chronic lacune the anterior right corona radiata. Normal brain volume. Vascular: Normal flow voids. Skull and upper cervical spine: Normal marrow signal Sinuses/Orbits: Retention cyst appearance in the midline nasopharynx. No acute finding. IMPRESSION: 1. 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 2. 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 Electronically Signed   By: Monte Fantasia M.D.   On: 05/09/2019 05:11   ECHOCARDIOGRAM COMPLETE BUBBLE STUDY  Result Date: 05/10/2019    ECHOCARDIOGRAM REPORT   Patient Name:   Kimberly Mcdonald Date of Exam: 05/09/2019 Medical Rec #:  [id number]             Height:       64.0 in Accession #:    [id number]            Weight:       200.0 lb Date of Birth:  05-10-1965             BSA:          1.956  m Patient Age:    15 years              BP:           169/94 mmHg Patient Gender: F                     HR:           80 bpm. Exam Location:  [facility] Procedure: 2D Echo Indications:     Stroke 434.91/I63.9  History:         Patient has no prior history of Echocardiogram examinations.                  Stroke; Risk Factors:Hypertension and Current Smoker. Asthma.  Sonographer:     Avanell Shackleton Referring Phys:  [id number] Nathaniel Wakeley Diagnosing Phys: Nelva Bush MD IMPRESSIONS  1. Left ventricular ejection fraction, by estimation, is 55 to 60%. The left ventricle has normal function. The left ventricle has no regional wall motion abnormalities. There is mild left  ventricular hypertrophy. Left ventricular diastolic parameters are consistent with Grade I diastolic dysfunction (impaired relaxation).  2. Right ventricular systolic function is normal. The right ventricular size is normal. There is normal pulmonary artery systolic pressure.  3. Left atrial size was moderately dilated.  4. Right atrial size was mildly dilated.  5. The mitral valve is normal in structure. Mild mitral valve regurgitation. No evidence of mitral stenosis.  6. The aortic valve is tricuspid. Aortic valve regurgitation is trivial. No aortic stenosis is present.  7. The inferior vena cava is normal in size with greater than 50% respiratory variability, suggesting right atrial pressure of 3 mmHg.  8. Agitated saline contrast bubble study was negative, with no evidence of any interatrial shunt. FINDINGS  Left Ventricle: Left ventricular ejection fraction, by estimation, is 55 to 60%. The left ventricle has normal function. The left ventricle has no regional wall motion abnormalities. The left ventricular internal cavity size was normal in size. There is  mild left ventricular hypertrophy. Left ventricular diastolic parameters are consistent with Grade I diastolic dysfunction (impaired relaxation). Right Ventricle: The right ventricular size is normal. No increase in right ventricular wall thickness. Right ventricular systolic function is normal. There is normal pulmonary artery systolic pressure. The tricuspid regurgitant velocity is 2.81 m/s, and  with an assumed right atrial pressure of 3 mmHg, the estimated right ventricular systolic pressure is 62.8 mmHg. Left Atrium: Left atrial size was moderately dilated. Right Atrium: Right atrial size was mildly dilated. Pericardium: Trivial pericardial effusion is present. The pericardial effusion is posterior to the left ventricle. Mitral Valve: The mitral valve is normal in structure. Mild mitral valve regurgitation. No evidence of mitral valve stenosis.  Tricuspid Valve: The tricuspid valve is grossly normal. Tricuspid valve regurgitation is mild. Aortic Valve: The aortic valve is tricuspid. Aortic valve regurgitation is trivial. No aortic stenosis is present. Pulmonic Valve: The pulmonic valve was normal in structure. Pulmonic valve regurgitation is trivial. No evidence of pulmonic stenosis. Aorta: The aortic root is normal in size and structure. Pulmonary Artery: The pulmonary artery is of normal size. Venous: The inferior vena cava is normal in size with greater than 50% respiratory variability, suggesting right atrial pressure of 3 mmHg. IAS/Shunts: No atrial level shunt detected by color flow Doppler. Agitated saline contrast was given intravenously to evaluate for intracardiac shunting. Agitated saline contrast bubble study was negative, with no evidence of any interatrial shunt.  LEFT VENTRICLE PLAX 2D LVIDd:  4.44 cm  Diastology LVIDs:         3.15 cm  LV e' lateral:   8.59 cm/s LV PW:         0.98 cm  LV E/e' lateral: 11.6 LV IVS:        1.09 cm  LV e' medial:    7.18 cm/s LVOT diam:     2.00 cm  LV E/e' medial:  13.9 LVOT Area:     3.14 cm  RIGHT VENTRICLE             IVC RV S prime:     14.40 cm/s  IVC diam: 1.69 cm TAPSE (M-mode): 2.9 cm LEFT ATRIUM             Index       RIGHT ATRIUM           Index LA diam:        3.80 cm 1.94 cm/m  RA Area:     18.90 cm LA Vol (A2C):   98.8 ml 50.50 ml/m RA Volume:   52.50 ml  26.84 ml/m LA Vol (A4C):   82.7 ml 42.27 ml/m LA Biplane Vol: 90.5 ml 46.26 ml/m   AORTA Ao Root diam: 3.30 cm MITRAL VALVE                TRICUSPID VALVE MV Area (PHT): 3.76 cm     TR Peak grad:   31.6 mmHg MV Decel Time: 202 msec     TR Vmax:        281.00 cm/s MR Peak grad: 180.3 mmHg MR Mean grad: 126.3 mmHg    SHUNTS MR Vmax:      671.33 cm/s   Systemic Diam: 2.00 cm MR Vmean:     542.3 cm/s MV E velocity: 99.90 cm/s MV A velocity: 126.00 cm/s MV E/A ratio:  0.79 Harrell Gave End MD Electronically signed by Nelva Bush MD  Signature Date/Time: 05/10/2019/6:29:35 AM    Final     Medications:  I have reviewed the patient's current medications. Scheduled: .  stroke: mapping our early stages of recovery book   Does not apply Once  . amLODipine  10 mg Oral Daily  . atorvastatin  40 mg Oral Daily  . ferrous sulfate  325 mg Oral BID WC  . nicotine  21 mg Transdermal Daily  . pantoprazole  40 mg Intravenous Q12H  . polyethylene glycol-electrolytes  4,000 mL Oral Once  . vitamin B-12  500 mcg Oral Daily    Assessment/Plan: 54 y.o. female with a history of HTN and tobacco abuse who presents with GIB and complaints of right upper extremity numbness and weakness.  Symptoms now resolved.  Patient with no prior history of stroke.  On no antiplatelet or anticoagulation prior to presentation.  MRI of the brain personally reviewed and shows multiple small, acute infarcts in the left MCA distribution.  CTA of the head and neck personally reviewed and shows LICA 64% stenosis.  Suspect embolic etiology for acute infarcts from this large vessel atherosclerotic disease.  Echocardiogram shows no cardiac source of emboli or evidence of PFO.  EF 55-60%.   ESR 65.  Remaining hypercoag panel is pending.  A1c 5.2, LDL 80.  Stroke Risk Factors - hypertension and smoking  Plan: 1. Prophylactic therapy-None at this time due to GIB.  Will need clearance from GI 2. Smoking cessation counseling 3. BP control with target BP<140/80 4. Statin for lipid management with target LDL<70. 5. Vascular consult for  symptomatic LICA stenosis.   6. Follow up with neurology on an outpatient basis where remainder of hypercoag panel can be followed up.     LOS: 2 days   Alexis Goodell, MD Neurology [phone] 05/10/2019  9:46 AM

## 2019-05-10 NOTE — Progress Notes (Signed)
[facility] [place] NAME: Kimberly Mcdonald    MR#:  [id number]  DATE OF BIRTH:  [birth year]  SUBJECTIVE:  feels her right upper extremity is improving.  Feels better after blood transfusion tolerating IV iron REVIEW OF SYSTEMS:   Review of Systems  Constitutional: Positive for malaise/fatigue. Negative for chills, fever and weight loss.  HENT: Negative for ear discharge, ear pain and nosebleeds.   Eyes: Negative for blurred vision, pain and discharge.  Respiratory: Negative for sputum production, shortness of breath, wheezing and stridor.   Cardiovascular: Negative for chest pain, palpitations, orthopnea and PND.  Gastrointestinal: Positive for melena. Negative for abdominal pain, diarrhea, nausea and vomiting.  Genitourinary: Negative for frequency and urgency.  Musculoskeletal: Negative for back pain and joint pain.  Neurological: Positive for focal weakness and weakness. Negative for sensory change and speech change.  Psychiatric/Behavioral: Negative for depression and hallucinations. The patient is not nervous/anxious.    Tolerating Diet:yes Tolerating PT: NO PT f/u  DRUG ALLERGIES:  No Known Allergies  VITALS:  Blood pressure (!) 141/83, pulse 96, temperature 98.7 F (37.1 C), temperature source Oral, resp. rate (!) 25, height 5\' 4"  (1.626 m), weight 90.7 kg, last menstrual period 10/01/2016, SpO2 94 %.  PHYSICAL EXAMINATION:   Physical Exam  GENERAL:  54 y.o.-year-old patient lying in the bed with no acute distress.  EYES: Pupils equal, round, reactive to light and accommodation. No scleral icterus. Pallor+   HEENT: Head atraumatic, normocephalic. Oropharynx and nasopharynx clear.  NECK:  Supple, no jugular venous distention. No thyroid enlargement, no tenderness.  LUNGS: Normal breath sounds bilaterally, no wheezing, rales, rhonchi. No use of accessory muscles of respiration.  CARDIOVASCULAR: S1, S2 normal. No murmurs, rubs,  or gallops.  ABDOMEN: Soft, nontender, nondistended. Bowel sounds present. No organomegaly or mass.  EXTREMITIES: No cyanosis, clubbing or edema b/l.    NEUROLOGIC: Cranial nerves II through XII are intact. No focal Motor or sensory deficits b/l.  Mild Right UE weakness. Reflexes 3+ PSYCHIATRIC:  patient is alert and oriented x 3.  SKIN: No obvious rash, lesion, or ulcer.   LABORATORY PANEL:  CBC Recent Labs  Lab 05/09/19 1345  WBC 7.3  HGB 7.7*  HCT 24.9*  PLT 275    Chemistries  Recent Labs  Lab 05/08/19 1215 05/09/19 0352  NA 140  --   K 3.2*  --   CL 108  --   CO2 25  --   GLUCOSE 109*  --   BUN 8  --   CREATININE 0.55  --   CALCIUM 9.1  --   MG  --  2.0  AST 11*  --   ALT 6  --   ALKPHOS 93  --   BILITOT 0.8  --    Cardiac Enzymes No results for input(s): TROPONINI in the last 168 hours. RADIOLOGY:  CT ANGIO HEAD W OR WO CONTRAST  Result Date: 05/09/2019 CLINICAL DATA:  Right upper extremity numbness and weakness. EXAM: CT ANGIOGRAPHY HEAD AND NECK TECHNIQUE: Multidetector CT imaging of the head and neck was performed using the standard protocol during bolus administration of intravenous contrast. Multiplanar CT image reconstructions and MIPs were obtained to evaluate the vascular anatomy. Carotid stenosis measurements (when applicable) are obtained utilizing NASCET criteria, using the distal internal carotid diameter as the denominator. CONTRAST:  4mL OMNIPAQUE IOHEXOL 350 MG/ML SOLN COMPARISON:  MRI of the brain May 09, 2019 FINDINGS: CT HEAD FINDINGS Brain: Few hypodense  foci within left centrum semiovale in a watershed type distribution, corresponding to areas of restricted diffusion seen on prior MRI. No large acute territorial infarct. No intracranial hemorrhage, hydrocephalus, extra-axial collection on mass lesion. Vascular: No hyperdense vessel or unexpected calcification. Skull: Normal. Negative for fracture or focal lesion. Sinuses: Imaged portions are  clear. Orbits: No acute finding. Review of the MIP images confirms the above findings CTA NECK FINDINGS Aortic arch: Standard branching. Imaged portion shows no evidence of aneurysm or dissection. No significant stenosis of the major arch vessel origins. However, there is extreme tortuosity of the proximal major neck arteries, unusual for patient's age. Right carotid system: No evidence of dissection, stenosis or occlusion. Left carotid system: Atherosclerotic changes with soft plaque noted in the left carotid bifurcation resulting in 70% stenosis of the left ICA at the bulb. 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 Dominant left vertebral artery with increased tortuosity at the V1 segment without stenosis. Skeleton: Degenerative changes of the cervical spine, more pronounced at C5-6 and C6-7, where there is at least mild spinal canal stenosis and bilateral neural foraminal narrowing. Other neck: Negative Upper chest: Prominent right paratracheal and paraortic lymph nodes. Review of the MIP images confirms the above findings CTA HEAD FINDINGS Anterior circulation: Mild asymmetry of the intracranial internal carotid arteries with decrease caliber of the left ICA, likely related to chronic stenosis at the neck. Right A1/ACA is dominant. No vessel occlusion or aneurysm identified. Posterior circulation: Dominant left vertebral artery. Mildly hypoplastic right P1/PCA segment related to the presence of prominent right posterior communicating artery. The basilar artery and posterior cerebral arteries are otherwise unremarkable. Venous sinuses: As permitted by contrast timing, patent. Anatomic variants: Hypoplastic right vertebral artery. Review of the MIP images confirms the above findings IMPRESSION: 1. 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 2. Hypoplastic right vertebral artery  with hairline stenosis at the origin. 3. No evidence of intracranial or cervical large vessel occlusion or aneurysm. 4. Prominent right paratracheal and paraortic lymph nodes. 5. Degenerative changes of the cervical spine, more pronounced at C5-6 and C6-7, where there is at least mild spinal canal stenosis and bilateral neural foraminal narrowing. Electronically Signed   By: Baldemar Lenis M.D.   On: 05/09/2019 12:46   CT ANGIO NECK W OR WO CONTRAST  Result Date: 05/09/2019 CLINICAL DATA:  Right upper extremity numbness and weakness. EXAM: CT ANGIOGRAPHY HEAD AND NECK TECHNIQUE: Multidetector CT imaging of the head and neck was performed using the standard protocol during bolus administration of intravenous contrast. Multiplanar CT image reconstructions and MIPs were obtained to evaluate the vascular anatomy. Carotid stenosis measurements (when applicable) are obtained utilizing NASCET criteria, using the distal internal carotid diameter as the denominator. CONTRAST:  84mL OMNIPAQUE IOHEXOL 350 MG/ML SOLN COMPARISON:  MRI of the brain May 09, 2019 FINDINGS: CT HEAD FINDINGS Brain: Few hypodense foci within left centrum semiovale in a watershed type distribution, corresponding to areas of restricted diffusion seen on prior MRI. No large acute territorial infarct. No intracranial hemorrhage, hydrocephalus, extra-axial collection on mass lesion. Vascular: No hyperdense vessel or unexpected calcification. Skull: Normal. Negative for fracture or focal lesion. Sinuses: Imaged portions are clear. Orbits: No acute finding. Review of the MIP images confirms the above findings CTA NECK FINDINGS Aortic arch: Standard branching. Imaged portion shows no evidence of aneurysm or dissection. No significant stenosis of the major  arch vessel origins. However, there is extreme tortuosity of the proximal major neck arteries, unusual for patient's age. Right carotid system: No evidence of dissection, stenosis or  occlusion. Left carotid system: Atherosclerotic changes with soft plaque noted in the left carotid bifurcation resulting in 70% stenosis of the left ICA at the bulb. 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 Dominant left vertebral artery with increased tortuosity at the V1 segment without stenosis. Skeleton: Degenerative changes of the cervical spine, more pronounced at C5-6 and C6-7, where there is at least mild spinal canal stenosis and bilateral neural foraminal narrowing. Other neck: Negative Upper chest: Prominent right paratracheal and paraortic lymph nodes. Review of the MIP images confirms the above findings CTA HEAD FINDINGS Anterior circulation: Mild asymmetry of the intracranial internal carotid arteries with decrease caliber of the left ICA, likely related to chronic stenosis at the neck. Right A1/ACA is dominant. No vessel occlusion or aneurysm identified. Posterior circulation: Dominant left vertebral artery. Mildly hypoplastic right P1/PCA segment related to the presence of prominent right posterior communicating artery. The basilar artery and posterior cerebral arteries are otherwise unremarkable. Venous sinuses: As permitted by contrast timing, patent. Anatomic variants: Hypoplastic right vertebral artery. Review of the MIP images confirms the above findings IMPRESSION: 1. 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 2. 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 3. No evidence of intracranial or cervical large vessel occlusion or aneurysm. 4. Prominent right paratracheal and paraortic lymph nodes. 5. Degenerative changes of the cervical spine, more pronounced at C5-6 and C6-7, where there is at least mild spinal canal stenosis and bilateral neural foraminal narrowing. Electronically Signed   By: Baldemar Lenis M.D.   On:  05/09/2019 12:46   MR BRAIN WO CONTRAST  Result Date: 05/09/2019 CLINICAL DATA:  Right arm numbness. EXAM: MRI HEAD WITHOUT CONTRAST TECHNIQUE: Multiplanar, multiecho pulse sequences of the brain and surrounding structures were obtained without intravenous contrast. COMPARISON:  Head CT from yesterday FINDINGS: Brain: Cluster of small cortical and subcortical foci of restricted diffusion in the high left frontal and anterior parietal lobes. No hemorrhage, hydrocephalus, or masslike finding. Chronic lacune the anterior right corona radiata. Normal brain volume. Vascular: Normal flow voids. Skull and upper cervical spine: Normal marrow signal Sinuses/Orbits: Retention cyst appearance in the midline nasopharynx. No acute finding. IMPRESSION: 1. 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 2. 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 Electronically Signed   By: Marnee Spring M.D.   On: 05/09/2019 05:11   ECHOCARDIOGRAM COMPLETE BUBBLE STUDY  Result Date: 05/10/2019    ECHOCARDIOGRAM REPORT   Patient Name:   YURANI FETTES Date of Exam: 05/09/2019 Medical Rec #:  [id number]             Height:       64.0 in Accession #:    [id number]            Weight:       200.0 lb Date of Birth:  January 18, 1965             BSA:          1.956 m Patient Age:    54 years              BP:           169/94 mmHg  Patient Gender: F                     HR:           80 bpm. Exam Location:  [facility] Procedure: 2D Echo Indications:     Stroke 434.91/I63.9  History:         Patient has no prior history of Echocardiogram examinations.                  Stroke; Risk Factors:Hypertension and Current Smoker. Asthma.  Sonographer:     Johnathan HausenSharika Mucker Referring Phys:  [id number] LESLIE REYNOLDS Diagnosing Phys: Yvonne Kendallhristopher End MD IMPRESSIONS  1. Left ventricular ejection fraction, by estimation, is 55 to 60%. The left ventricle has normal function. The left ventricle has no regional wall motion  abnormalities. There is mild left ventricular hypertrophy. Left ventricular diastolic parameters are consistent with Grade I diastolic dysfunction (impaired relaxation).  2. Right ventricular systolic function is normal. The right ventricular size is normal. There is normal pulmonary artery systolic pressure.  3. Left atrial size was moderately dilated.  4. Right atrial size was mildly dilated.  5. The mitral valve is normal in structure. Mild mitral valve regurgitation. No evidence of mitral stenosis.  6. The aortic valve is tricuspid. Aortic valve regurgitation is trivial. No aortic stenosis is present.  7. The inferior vena cava is normal in size with greater than 50% respiratory variability, suggesting right atrial pressure of 3 mmHg.  8. Agitated saline contrast bubble study was negative, with no evidence of any interatrial shunt. FINDINGS  Left Ventricle: Left ventricular ejection fraction, by estimation, is 55 to 60%. The left ventricle has normal function. The left ventricle has no regional wall motion abnormalities. The left ventricular internal cavity size was normal in size. There is  mild left ventricular hypertrophy. Left ventricular diastolic parameters are consistent with Grade I diastolic dysfunction (impaired relaxation). Right Ventricle: The right ventricular size is normal. No increase in right ventricular wall thickness. Right ventricular systolic function is normal. There is normal pulmonary artery systolic pressure. The tricuspid regurgitant velocity is 2.81 m/s, and  with an assumed right atrial pressure of 3 mmHg, the estimated right ventricular systolic pressure is 34.6 mmHg. Left Atrium: Left atrial size was moderately dilated. Right Atrium: Right atrial size was mildly dilated. Pericardium: Trivial pericardial effusion is present. The pericardial effusion is posterior to the left ventricle. Mitral Valve: The mitral valve is normal in structure. Mild mitral valve regurgitation. No evidence  of mitral valve stenosis. Tricuspid Valve: The tricuspid valve is grossly normal. Tricuspid valve regurgitation is mild. Aortic Valve: The aortic valve is tricuspid. Aortic valve regurgitation is trivial. No aortic stenosis is present. Pulmonic Valve: The pulmonic valve was normal in structure. Pulmonic valve regurgitation is trivial. No evidence of pulmonic stenosis. Aorta: The aortic root is normal in size and structure. Pulmonary Artery: The pulmonary artery is of normal size. Venous: The inferior vena cava is normal in size with greater than 50% respiratory variability, suggesting right atrial pressure of 3 mmHg. IAS/Shunts: No atrial level shunt detected by color flow Doppler. Agitated saline contrast was given intravenously to evaluate for intracardiac shunting. Agitated saline contrast bubble study was negative, with no evidence of any interatrial shunt.  LEFT VENTRICLE PLAX 2D LVIDd:         4.44 cm  Diastology LVIDs:         3.15 cm  LV e' lateral:   8.59 cm/s LV PW:  0.98 cm  LV E/e' lateral: 11.6 LV IVS:        1.09 cm  LV e' medial:    7.18 cm/s LVOT diam:     2.00 cm  LV E/e' medial:  13.9 LVOT Area:     3.14 cm  RIGHT VENTRICLE             IVC RV S prime:     14.40 cm/s  IVC diam: 1.69 cm TAPSE (M-mode): 2.9 cm LEFT ATRIUM             Index       RIGHT ATRIUM           Index LA diam:        3.80 cm 1.94 cm/m  RA Area:     18.90 cm LA Vol (A2C):   98.8 ml 50.50 ml/m RA Volume:   52.50 ml  26.84 ml/m LA Vol (A4C):   82.7 ml 42.27 ml/m LA Biplane Vol: 90.5 ml 46.26 ml/m   AORTA Ao Root diam: 3.30 cm MITRAL VALVE                TRICUSPID VALVE MV Area (PHT): 3.76 cm     TR Peak grad:   31.6 mmHg MV Decel Time: 202 msec     TR Vmax:        281.00 cm/s MR Peak grad: 180.3 mmHg MR Mean grad: 126.3 mmHg    SHUNTS MR Vmax:      671.33 cm/s   Systemic Diam: 2.00 cm MR Vmean:     542.3 cm/s MV E velocity: 99.90 cm/s MV A velocity: 126.00 cm/s MV E/A ratio:  0.79 Cristal Deer End MD Electronically  signed by Yvonne Kendall MD Signature Date/Time: 05/10/2019/6:29:35 AM    Final    ASSESSMENT AND PLAN:  Jayliana Valencia is a 54 y.o. female with medical history significant of hypertension, substance abuse, tobacco abuse, who presents with dark stool on the right arm numbness. Patient states that she has been having dark stool which has been going on for several weeks.  She has generalized weakness  GIB (gastrointestinal bleeding) and anemia due to blood loss:  -Hgb 11.8 --> 4.0 -->3.8. --- 4 unit PRBC--7.7 -Dr. Tobi Bastos of GI is consulted-- In the setting of new stroke G.I. plans to hold off on any luminal evaluation for few weeks -start patient on oral and IV iron -vitamin B12 orally -PPI bid -patient reports on of NSAIDs not as much as she used to take before   Anemia: iron deficiency with B12 -started on oral and IV iron -oral B12 -patient will see Dr. Smith Robert as outpatient.  Hypertension: pt is not taking Bp med.  -IV hydralazine as needed -on oral amlodipine   Acute left MCA CVA -pt came in with Right arm numbness. Risk factor smoking, marijuana use, hypertension untreated - CT of head showed possible lacunar stroke -MRI brain Cluster of small acute white matter and cortical infarcts in the left MCA distribution. 2. 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 Thad Ranger. Given G.I. bleeding no antiplatelet agent at present.  Left ICA stenosis 70-99% -vascular surgery consultation with Dr. 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 - Repleted - Mg level 2.0   DVT ppx:  SCD Code Status: Full code Family Communication: patient tells me her daughter is aware. She does not want me to call  her. Disposition Plan:  Anticipate discharge back to previous home environment Consults called:   G.I. Dr. Tobi Bastos, Dr. Thad Ranger neurology, vascular surgery Dr. Gilda Crease Admission status:  Status is: Inpatient Remains inpatient appropriate because: patient needs further workup for her stroke Dispo: The patient is from: Home  Anticipated d/c is to: Home  Anticipated d/c date is: 1-3 days  Patient currently is not medically stable to d/c. Currently being worked up for acute CVA and severe anemia.       TOTAL TIME TAKING CARE OF THIS PATIENT: *30* minutes.  >50% time spent on counselling and coordination of care  Note: This dictation was prepared with Dragon dictation along with smaller phrase technology. Any transcriptional errors that result from this process are unintentional.  Enedina Finner M.D    [facility]   CC: Primary care physician; Patient, No Pcp PerPatient ID: [name], female   DOB: 1965/10/30, 54 y.o.   MRN: [id number]

## 2019-05-10 NOTE — Consult Note (Signed)
[facility] Vascular Consult Note  MRN : [id number]  Kimberly Mcdonald is a 54 y.o. (10-May-1965) female who presents with chief complaint of  Chief Complaint  Patient presents with  . Numbness   History of Present Illness:  The patient is a 54 year old female with multiple medical issues (see below) who presented to the [facility] emergency department with a chief complaint of numbness to the right arm.  Patient endorses a history of "numbness" starting in the right hand which progressed to the right upper extremity.  She was also experiencing some shortness of breath and dizziness.  Patient has been experiencing dark stools for the past month.  She denies any abdominal pain.  Denies any fever, nausea or vomiting.  Denies the use of blood thinners.  During the patient's work-up, she underwent CT angio of the head and neck which was notable for (05/09/19): 1. 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 2. 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 3. No evidence of intracranial or cervical large vessel occlusion or aneurysm. 4. Prominent right paratracheal and paraortic lymph nodes. 5. Degenerative changes of the cervical spine, more pronounced at C5-6 and C6-7, where there is at least mild spinal canal stenosis and bilateral neural foraminal narrowing.  Vascular surgery was consulted by Dr. Allena Katz for carotid stenosis.  Current Facility-Administered Medications  Medication Dose Route Frequency Provider Last Rate Last Admin  .  stroke: mapping our early stages of recovery book   Does not apply Once Lorretta Harp, MD      . 0.9 %  sodium chloride infusion   Intravenous PRN Enedina Finner, MD 50 mL/hr at 05/09/19 1629 50 mL at 05/09/19 1629  . acetaminophen (TYLENOL) tablet 650 mg  650 mg Oral Q4H PRN Lorretta Harp, MD       Or  .  acetaminophen (TYLENOL) 160 MG/5ML solution 650 mg  650 mg Per Tube Q4H PRN Lorretta Harp, MD       Or  . acetaminophen (TYLENOL) suppository 650 mg  650 mg Rectal Q4H PRN Lorretta Harp, MD      . amLODipine (NORVASC) tablet 10 mg  10 mg Oral Daily Enedina Finner, MD   10 mg at 05/10/19 0951  . atorvastatin (LIPITOR) tablet 40 mg  40 mg Oral Daily Lorretta Harp, MD   40 mg at 05/10/19 0951  . ferrous sulfate tablet 325 mg  325 mg Oral BID WC Enedina Finner, MD   325 mg at 05/10/19 0950  . hydrALAZINE (APRESOLINE) injection 5 mg  5 mg Intravenous Q2H PRN Lorretta Harp, MD      . iron sucrose (VENOFER) 200 mg in sodium chloride 0.9 % 100 mL IVPB  200 mg Intravenous Q24H Enedina Finner, MD 440 mL/hr at 05/09/19 1631 200 mg at 05/09/19 1631  . nicotine (NICODERM CQ - dosed in mg/24 hours) patch 21 mg  21 mg Transdermal Daily Lorretta Harp, MD   21 mg at 05/10/19 1007  . ondansetron (ZOFRAN) injection 4 mg  4 mg Intravenous Q8H PRN Lorretta Harp, MD      . pantoprazole (PROTONIX) injection 40 mg  40 mg Intravenous Q12H Enedina Finner, MD   40 mg at 05/10/19 0950  . polyethylene glycol-electrolytes (NuLYTELY) solution 4,000 mL  4,000 mL Oral Once Lorretta Harp, MD      . vitamin B-12 (CYANOCOBALAMIN) tablet 500 mcg  500 mcg Oral Daily Fritzi Mandes, MD   500 mcg at 05/10/19 4259   Past Medical History:  Diagnosis Date  . Allergy    seasonal  . Anemia due to blood loss 05/08/2019  . Arthritis    knees and back  . Cerebrovascular accident (CVA) ([place])   . Hypertension   . Substance abuse ([place])    Marijuana smoker x35 yrs   Past Surgical History:  Procedure Laterality Date  . TUBAL LIGATION     Social History Social History   Tobacco Use  . Smoking status: Current Some Day Smoker    Packs/day: 0.70    Types: Cigarettes  . Smokeless tobacco: Never Used  Substance Use Topics  . Alcohol use: Yes    Alcohol/week: 3.0 standard drinks    Types: 3 Cans of beer per week  . Drug use: Yes    Frequency: 7.0 times per week     Types: Marijuana   Family History Family History  Problem Relation Age of Onset  . Diabetes Mother   . Hypertension Mother   . Cancer Father   . Alcohol abuse Sister   Denies family history of peripheral artery disease, venous disease or renal disease.  No Known Allergies   REVIEW OF SYSTEMS (Negative unless checked)  Constitutional: [] Weight loss  [] Fever  [] Chills Cardiac: [] Chest pain   [] Chest pressure   [] Palpitations   [x] Shortness of breath when laying flat   [x] Shortness of breath at rest   [x] Shortness of breath with exertion. Vascular:  [] Pain in legs with walking   [] Pain in legs at rest   [] Pain in legs when laying flat   [] Claudication   [] Pain in feet when walking  [] Pain in feet at rest  [] Pain in feet when laying flat   [] History of DVT   [] Phlebitis   [] Swelling in legs   [] Varicose veins   [] Non-healing ulcers Pulmonary:   [] Uses home oxygen   [] Productive cough   [] Hemoptysis   [] Wheeze  [] COPD   [] Asthma Neurologic:  [x] Dizziness  [] Blackouts   [] Seizures   [] History of stroke   [] History of TIA  [] Aphasia   [] Temporary blindness   [] Dysphagia   [x] Weakness or numbness in arms   [] Weakness or numbness in legs Musculoskeletal:  [] Arthritis   [] Joint swelling   [] Joint pain   [] Low back pain Hematologic:  [x] Easy bruising  [x] Easy bleeding   [] Hypercoagulable state   [x] Anemic  [] Hepatitis Gastrointestinal:  [] Blood in stool   [] Vomiting blood  [] Gastroesophageal reflux/heartburn   [] Difficulty swallowing. Genitourinary:  [] Chronic kidney disease   [] Difficult urination  [] Frequent urination  [] Burning with urination   [] Blood in urine Skin:  [] Rashes   [] Ulcers   [] Wounds Psychological:  [] History of anxiety   []  History of major depression.  Physical Examination  Vitals:   05/10/19 0416 05/10/19 0450 05/10/19 0836 05/10/19 1349  BP: (!) 148/85 137/77 (!) 156/98 (!) 141/83  Pulse: 82 78 80 96  Resp: 16 20 20  (!) 25  Temp: 98.2 F (36.8 C) 98.6 F (37 C) 98.2 F  (36.8 C) 98.7 F (37.1 C)  TempSrc: Oral Oral Oral Oral  SpO2: 95% 95% 98% 94%  Weight:      Height:       Body mass index is 34.33 kg/m. Gen:  WD/WN, NAD Head: [place]/AT, No temporalis wasting. Prominent temp pulse not noted. Ear/Nose/Throat: Hearing grossly intact, nares w/o erythema or drainage, oropharynx w/o Erythema/Exudate Eyes: Sclera non-icteric, conjunctiva clear Neck: Trachea  midline.  No JVD.  Left-sided bruit. Pulmonary:  Good air movement, respirations not labored, equal bilaterally.  Cardiac: RRR, normal S1, S2. Vascular:  Vessel Right Left  Radial Palpable Palpable  Ulnar Palpable Palpable  Brachial Palpable Palpable  Carotid Palpable, without bruit Palpable, without bruit  Aorta Not palpable N/A  Femoral Palpable Palpable  Popliteal Palpable Palpable  PT Palpable Palpable  DP Palpable Palpable   Gastrointestinal: soft, non-tender/non-distended. No guarding/reflex.  Musculoskeletal: M/S 5/5 throughout.  Extremities without ischemic changes.  No deformity or atrophy. No edema. Neurologic: Sensation grossly intact in extremities.  Symmetrical.  Speech is fluent. Motor exam as listed above. Psychiatric: Judgment intact, Mood & affect appropriate for pt's clinical situation. Dermatologic: No rashes or ulcers noted.  No cellulitis or open wounds. Lymph : No Cervical, Axillary, or Inguinal lymphadenopathy.  CBC Lab Results  Component Value Date   WBC 7.3 05/09/2019   HGB 7.7 (L) 05/09/2019   HCT 24.9 (L) 05/09/2019   MCV 71.8 (L) 05/09/2019   PLT 275 05/09/2019   BMET    Component Value Date/Time   NA 140 05/08/2019 1215   NA 141 09/03/2017 1854   K 3.2 (L) 05/08/2019 1215   CL 108 05/08/2019 1215   CO2 25 05/08/2019 1215   GLUCOSE 109 (H) 05/08/2019 1215   BUN 8 05/08/2019 1215   BUN 20 09/03/2017 1854   CREATININE 0.55 05/08/2019 1215   CALCIUM 9.1 05/08/2019 1215   GFRNONAA >60 05/08/2019 1215   GFRAA >60 05/08/2019 1215   Estimated Creatinine  Clearance: 86.7 mL/min (by C-G formula based on SCr of 0.55 mg/dL).  COAG Lab Results  Component Value Date   INR 1.0 05/08/2019   Radiology CT ANGIO HEAD W OR WO CONTRAST  Result Date: 05/09/2019 CLINICAL DATA:  Right upper extremity numbness and weakness. EXAM: CT ANGIOGRAPHY HEAD AND NECK TECHNIQUE: Multidetector CT imaging of the head and neck was performed using the standard protocol during bolus administration of intravenous contrast. Multiplanar CT image reconstructions and MIPs were obtained to evaluate the vascular anatomy. Carotid stenosis measurements (when applicable) are obtained utilizing NASCET criteria, using the distal internal carotid diameter as the denominator. CONTRAST:  75mL OMNIPAQUE IOHEXOL 350 MG/ML SOLN COMPARISON:  MRI of the brain May 09, 2019 FINDINGS: CT HEAD FINDINGS Brain: Few hypodense foci within left centrum semiovale in a watershed type distribution, corresponding to areas of restricted diffusion seen on prior MRI. No large acute territorial infarct. No intracranial hemorrhage, hydrocephalus, extra-axial collection on mass lesion. Vascular: No hyperdense vessel or unexpected calcification. Skull: Normal. Negative for fracture or focal lesion. Sinuses: Imaged portions are clear. Orbits: No acute finding. Review of the MIP images confirms the above findings CTA NECK FINDINGS Aortic arch: Standard branching. Imaged portion shows no evidence of aneurysm or dissection. No significant stenosis of the major arch vessel origins. However, there is extreme tortuosity of the proximal major neck arteries, unusual for patient's age. Right carotid system: No evidence of dissection, stenosis or occlusion. Left carotid system: Atherosclerotic changes with soft plaque noted in the left carotid bifurcation resulting in 70% stenosis of the left ICA at the bulb. 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 Dominant left vertebral artery with  increased tortuosity at the V1 segment without stenosis. Skeleton: Degenerative changes of the cervical spine, more pronounced at C5-6 and C6-7, where there is at least mild spinal canal stenosis and bilateral neural foraminal narrowing. Other neck: Negative Upper chest: Prominent right paratracheal and  paraortic lymph nodes. Review of the MIP images confirms the above findings CTA HEAD FINDINGS Anterior circulation: Mild asymmetry of the intracranial internal carotid arteries with decrease caliber of the left ICA, likely related to chronic stenosis at the neck. Right A1/ACA is dominant. No vessel occlusion or aneurysm identified. Posterior circulation: Dominant left vertebral artery. Mildly hypoplastic right P1/PCA segment related to the presence of prominent right posterior communicating artery. The basilar artery and posterior cerebral arteries are otherwise unremarkable. Venous sinuses: As permitted by contrast timing, patent. Anatomic variants: Hypoplastic right vertebral artery. Review of the MIP images confirms the above findings IMPRESSION: 1. 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 2. 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 3. No evidence of intracranial or cervical large vessel occlusion or aneurysm. 4. Prominent right paratracheal and paraortic lymph nodes. 5. Degenerative changes of the cervical spine, more pronounced at C5-6 and C6-7, where there is at least mild spinal canal stenosis and bilateral neural foraminal narrowing. Electronically Signed   By: Baldemar Lenis M.D.   On: 05/09/2019 12:46   CT HEAD WO CONTRAST  Result Date: 05/08/2019 CLINICAL DATA:  Numbness, possible stroke EXAM: CT HEAD WITHOUT CONTRAST TECHNIQUE: Contiguous axial images were obtained from the base of the skull through the vertex without intravenous contrast. COMPARISON:  None. FINDINGS:  Brain: No definite evidence of acute infarction, hemorrhage, hydrocephalus, extra-axial collection or mass lesion/mass effect. Suspect small lacunar infarctions of the left caudate head and anterior limb of the right internal capsule/adjacent corona radiata (series 2, image 14). Vascular: No hyperdense vessel or unexpected calcification. Skull: Normal. Negative for fracture or focal lesion. Sinuses/Orbits: No acute finding. Other: None. IMPRESSION: No definite acute intracranial pathology. Suspect small lacunar infarctions of the left caudate head and anterior limb of the right internal capsule/adjacent corona radiata, generally age indeterminate. Consider MRI to more sensitively assess for acute diffusion restricting infarction if indicated by neurological signs and symptoms. Electronically Signed   By: Lauralyn Primes M.D.   On: 05/08/2019 12:54   CT ANGIO NECK W OR WO CONTRAST  Result Date: 05/09/2019 CLINICAL DATA:  Right upper extremity numbness and weakness. EXAM: CT ANGIOGRAPHY HEAD AND NECK TECHNIQUE: Multidetector CT imaging of the head and neck was performed using the standard protocol during bolus administration of intravenous contrast. Multiplanar CT image reconstructions and MIPs were obtained to evaluate the vascular anatomy. Carotid stenosis measurements (when applicable) are obtained utilizing NASCET criteria, using the distal internal carotid diameter as the denominator. CONTRAST:  75mL OMNIPAQUE IOHEXOL 350 MG/ML SOLN COMPARISON:  MRI of the brain May 09, 2019 FINDINGS: CT HEAD FINDINGS Brain: Few hypodense foci within left centrum semiovale in a watershed type distribution, corresponding to areas of restricted diffusion seen on prior MRI. No large acute territorial infarct. No intracranial hemorrhage, hydrocephalus, extra-axial collection on mass lesion. Vascular: No hyperdense vessel or unexpected calcification. Skull: Normal. Negative for fracture or focal lesion. Sinuses: Imaged portions are  clear. Orbits: No acute finding. Review of the MIP images confirms the above findings CTA NECK FINDINGS Aortic arch: Standard branching. Imaged portion shows no evidence of aneurysm or dissection. No significant stenosis of the major arch vessel origins. However, there is extreme tortuosity of the proximal major neck arteries, unusual for patient's age. Right carotid system: No evidence of dissection, stenosis or occlusion. Left carotid system: Atherosclerotic changes with soft plaque noted in the left carotid  bifurcation resulting in 70% stenosis of the left ICA at the bulb. 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 Dominant left vertebral artery with increased tortuosity at the V1 segment without stenosis. Skeleton: Degenerative changes of the cervical spine, more pronounced at C5-6 and C6-7, where there is at least mild spinal canal stenosis and bilateral neural foraminal narrowing. Other neck: Negative Upper chest: Prominent right paratracheal and paraortic lymph nodes. Review of the MIP images confirms the above findings CTA HEAD FINDINGS Anterior circulation: Mild asymmetry of the intracranial internal carotid arteries with decrease caliber of the left ICA, likely related to chronic stenosis at the neck. Right A1/ACA is dominant. No vessel occlusion or aneurysm identified. Posterior circulation: Dominant left vertebral artery. Mildly hypoplastic right P1/PCA segment related to the presence of prominent right posterior communicating artery. The basilar artery and posterior cerebral arteries are otherwise unremarkable. Venous sinuses: As permitted by contrast timing, patent. Anatomic variants: Hypoplastic right vertebral artery. Review of the MIP images confirms the above findings IMPRESSION: 1. 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 2. Hypoplastic right vertebral artery  with hairline stenosis at the origin. 3. No evidence of intracranial or cervical large vessel occlusion or aneurysm. 4. Prominent right paratracheal and paraortic lymph nodes. 5. Degenerative changes of the cervical spine, more pronounced at C5-6 and C6-7, where there is at least mild spinal canal stenosis and bilateral neural foraminal narrowing. Electronically Signed   By: Baldemar Lenis M.D.   On: 05/09/2019 12:46   MR BRAIN WO CONTRAST  Result Date: 05/09/2019 CLINICAL DATA:  Right arm numbness. EXAM: MRI HEAD WITHOUT CONTRAST TECHNIQUE: Multiplanar, multiecho pulse sequences of the brain and surrounding structures were obtained without intravenous contrast. COMPARISON:  Head CT from yesterday FINDINGS: Brain: Cluster of small cortical and subcortical foci of restricted diffusion in the high left frontal and anterior parietal lobes. No hemorrhage, hydrocephalus, or masslike finding. Chronic lacune the anterior right corona radiata. Normal brain volume. Vascular: Normal flow voids. Skull and upper cervical spine: Normal marrow signal Sinuses/Orbits: Retention cyst appearance in the midline nasopharynx. No acute finding. IMPRESSION: 1. 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 2. 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 Electronically Signed   By: Marnee Spring M.D.   On: 05/09/2019 05:11   ECHOCARDIOGRAM COMPLETE BUBBLE STUDY  Result Date: 05/10/2019    ECHOCARDIOGRAM REPORT   Patient Name:   Kimberly Mcdonald Date of Exam: 05/09/2019 Medical Rec #:  [id number]             Height:       64.0 in Accession #:    [id number]            Weight:       200.0 lb Date of Birth:  1965/10/12             BSA:          1.956 m Patient Age:    54 years              BP:           169/94 mmHg Patient Gender: F                     HR:           80 bpm. Exam Location:  [facility] Procedure: 2D  Echo Indications:     Stroke 434.91/I63.9  History:         Patient  has no prior history of Echocardiogram examinations.                  Stroke; Risk Factors:Hypertension and Current Smoker. Asthma.  Sonographer:     Johnathan HausenSharika Mucker Referring Phys:  [id number] LESLIE REYNOLDS Diagnosing Phys: Yvonne Kendallhristopher End MD IMPRESSIONS  1. Left ventricular ejection fraction, by estimation, is 55 to 60%. The left ventricle has normal function. The left ventricle has no regional wall motion abnormalities. There is mild left ventricular hypertrophy. Left ventricular diastolic parameters are consistent with Grade I diastolic dysfunction (impaired relaxation).  2. Right ventricular systolic function is normal. The right ventricular size is normal. There is normal pulmonary artery systolic pressure.  3. Left atrial size was moderately dilated.  4. Right atrial size was mildly dilated.  5. The mitral valve is normal in structure. Mild mitral valve regurgitation. No evidence of mitral stenosis.  6. The aortic valve is tricuspid. Aortic valve regurgitation is trivial. No aortic stenosis is present.  7. The inferior vena cava is normal in size with greater than 50% respiratory variability, suggesting right atrial pressure of 3 mmHg.  8. Agitated saline contrast bubble study was negative, with no evidence of any interatrial shunt. FINDINGS  Left Ventricle: Left ventricular ejection fraction, by estimation, is 55 to 60%. The left ventricle has normal function. The left ventricle has no regional wall motion abnormalities. The left ventricular internal cavity size was normal in size. There is  mild left ventricular hypertrophy. Left ventricular diastolic parameters are consistent with Grade I diastolic dysfunction (impaired relaxation). Right Ventricle: The right ventricular size is normal. No increase in right ventricular wall thickness. Right ventricular systolic function is normal. There is normal pulmonary artery systolic pressure. The tricuspid regurgitant velocity is 2.81 m/s, and  with an assumed right  atrial pressure of 3 mmHg, the estimated right ventricular systolic pressure is 34.6 mmHg. Left Atrium: Left atrial size was moderately dilated. Right Atrium: Right atrial size was mildly dilated. Pericardium: Trivial pericardial effusion is present. The pericardial effusion is posterior to the left ventricle. Mitral Valve: The mitral valve is normal in structure. Mild mitral valve regurgitation. No evidence of mitral valve stenosis. Tricuspid Valve: The tricuspid valve is grossly normal. Tricuspid valve regurgitation is mild. Aortic Valve: The aortic valve is tricuspid. Aortic valve regurgitation is trivial. No aortic stenosis is present. Pulmonic Valve: The pulmonic valve was normal in structure. Pulmonic valve regurgitation is trivial. No evidence of pulmonic stenosis. Aorta: The aortic root is normal in size and structure. Pulmonary Artery: The pulmonary artery is of normal size. Venous: The inferior vena cava is normal in size with greater than 50% respiratory variability, suggesting right atrial pressure of 3 mmHg. IAS/Shunts: No atrial level shunt detected by color flow Doppler. Agitated saline contrast was given intravenously to evaluate for intracardiac shunting. Agitated saline contrast bubble study was negative, with no evidence of any interatrial shunt.  LEFT VENTRICLE PLAX 2D LVIDd:         4.44 cm  Diastology LVIDs:         3.15 cm  LV e' lateral:   8.59 cm/s LV PW:         0.98 cm  LV E/e' lateral: 11.6 LV IVS:        1.09 cm  LV e' medial:    7.18 cm/s LVOT diam:     2.00 cm  LV E/e' medial:  13.9 LVOT Area:     3.14 cm  RIGHT VENTRICLE             IVC RV S prime:     14.40 cm/s  IVC diam: 1.69 cm TAPSE (M-mode): 2.9 cm LEFT ATRIUM             Index       RIGHT ATRIUM           Index LA diam:        3.80 cm 1.94 cm/m  RA Area:     18.90 cm LA Vol (A2C):   98.8 ml 50.50 ml/m RA Volume:   52.50 ml  26.84 ml/m LA Vol (A4C):   82.7 ml 42.27 ml/m LA Biplane Vol: 90.5 ml 46.26 ml/m   AORTA Ao Root  diam: 3.30 cm MITRAL VALVE                TRICUSPID VALVE MV Area (PHT): 3.76 cm     TR Peak grad:   31.6 mmHg MV Decel Time: 202 msec     TR Vmax:        281.00 cm/s MR Peak grad: 180.3 mmHg MR Mean grad: 126.3 mmHg    SHUNTS MR Vmax:      671.33 cm/s   Systemic Diam: 2.00 cm MR Vmean:     542.3 cm/s MV E velocity: 99.90 cm/s MV A velocity: 126.00 cm/s MV E/A ratio:  0.79 Christopher End MD Electronically signed by Yvonne Kendall MD Signature Date/Time: 05/10/2019/6:29:35 AM    Final    Assessment/Plan The patient is a 54 year old female with multiple medical issues (see below) who presented to the [facility] emergency department with a chief complaint of numbness to the right arm.  1.  Left carotid artery stenosis: Patient presents with right sided upper extremity weakness.  This has since resolved.  No neurological deficits.  Patient found to have acute CVA on CTA.  Patient also found to have significant left carotid artery stenosis.  Reviewed images with Dr. Gilda Crease, greater than 70% stenosis noted.  Recommend left carotid endarterectomy as Friday with Dr. Gilda Crease.  Seen and examined with Dr. Gilda Crease.  Procedure, risks and benefits plan to the patient.  All questions answered.  The patient wishes to proceed.  We will consult cardiology for cardiac clearance in regard to anesthesia.  2. GIB: Seen by gastroenterology. We will continue work-up in the outpatient setting. Received 4 units packed red blood cell for hemoglobin of 3.8, now 7.7. Supplement with B12 and iron  3.  Atherosclerotic disease: On statin. Case discussed with gastroenterology.  Will start ASA 81 mg p.o. daily after surgery.  Seen and examined with Dr. Romie Jumper, PA-C  05/10/2019 3:12 PM  This note was created with Dragon medical transcription system.  Any error is purely unintentional

## 2019-05-11 DIAGNOSIS — E876 Hypokalemia: Secondary | ICD-10-CM

## 2019-05-11 LAB — BASIC METABOLIC PANEL
Anion gap: 7 (ref 5–15)
BUN: 5 mg/dL — ABNORMAL LOW (ref 6–20)
CO2: 24 mmol/L (ref 22–32)
Calcium: 8.8 mg/dL — ABNORMAL LOW (ref 8.9–10.3)
Chloride: 108 mmol/L (ref 98–111)
Creatinine, Ser: 0.54 mg/dL (ref 0.44–1.00)
GFR calc Af Amer: >60 mL/min (ref >60)
GFR calc non Af Amer: >60 mL/min (ref >60)
Glucose, Bld: 90 mg/dL (ref 70–99)
Potassium: 3.4 mmol/L — ABNORMAL LOW (ref 3.5–5.1)
Sodium: 139 mmol/L (ref 135–145)

## 2019-05-11 LAB — CBC
HCT: 28.5 % — ABNORMAL LOW (ref 36.0–46.0)
Hemoglobin: 8.3 g/dL — ABNORMAL LOW (ref 12.0–15.0)
MCH: 21.7 pg — ABNORMAL LOW (ref 26.0–34.0)
MCHC: 29.1 g/dL — ABNORMAL LOW (ref 30.0–36.0)
MCV: 74.4 fL — ABNORMAL LOW (ref 80.0–100.0)
Platelets: 239 10*3/uL (ref 150–400)
RBC: 3.83 MIL/uL — ABNORMAL LOW (ref 3.87–5.11)
RDW: 29 % — ABNORMAL HIGH (ref 11.5–15.5)
WBC: 12 10*3/uL — ABNORMAL HIGH (ref 4.0–10.5)
nRBC: 0.4 % — ABNORMAL HIGH (ref 0.0–0.2)

## 2019-05-11 LAB — CELIAC DISEASE PANEL
Endomysial Ab, IgA: NEGATIVE
IgA: 311 mg/dL (ref 87–352)
Tissue Transglutaminase Ab, IgA: <2 U/mL (ref 0–3)

## 2019-05-11 MED ORDER — POTASSIUM CHLORIDE CRYS ER 20 MEQ PO TBCR
20.0000 meq | EXTENDED_RELEASE_TABLET | Freq: Once | ORAL | Status: AC
  Administered 2019-05-11: 20 meq via ORAL
  Filled 2019-05-11: qty 1

## 2019-05-11 NOTE — Consult Note (Signed)
CARDIOLOGY CONSULT NOTE               Patient ID: Kimberly Mcdonald MRN: [id number] DOB/AGE: 01/20/1965 54 y.o.  Admit date: 05/08/2019 Referring Physician Dr Lorretta HarpXilin Niu hospitalist Primary Physician none Primary Cardiologist none Reason for Consultation TIA CVA peripheral vascular disease preop for CEA  HPI: Patient is a 54 year old black female history of smoking substance abuse hypertension obesity found to have profound anemia low iron stores she has had dark stools complain of generalized weakness no nausea vomiting or abdominal pain no urea UTI symptoms complains of numbness in the right hand area which required further evaluation imaging suggested possible CVA lacunar area so neurology was consulted imaging suggested high-grade lesion in the carotid area requiring carotid endarterectomy.  Cardiology was then consulted for preoperative cardiac assessment prior to the procedure.  Patient denies any previous cardiac history but has not followed up with any cardiologist in the past  Review of systems complete and found to be negative unless listed above     Past Medical History:  Diagnosis Date  . Allergy    seasonal  . Anemia due to blood loss 05/08/2019  . Arthritis    knees and back  . Cerebrovascular accident (CVA) (HCC)   . Hypertension   . 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 TUBAL LIGATION      No medications prior to admission.   Social History   Socioeconomic History  . Marital status: Legally Separated    Spouse name: Not on file  . Number of children: Not on file  . Years of education: Not on file  . Highest education level: Not on file  Occupational History  . Not on file  Tobacco Use  . Smoking status: Current Some Day Smoker    Packs/day: 0.70    Types: Cigarettes  . Smokeless tobacco: Never Used  Substance and Sexual Activity  . Alcohol use: Yes    Alcohol/week: 3.0  standard drinks    Types: 3 Cans of beer per week  . Drug use: Yes    Frequency: 7.0 times per week    Types: Marijuana  . Sexual activity: Not Currently    Birth control/protection: None  Other Topics Concern  . Not on file  Social History Narrative  . Not on file   Social Determinants of Health   Financial Resource Strain:   . Difficulty of Paying Living Expenses:   Food Insecurity:   . Worried About Programme researcher, broadcasting/film/videounning Out of Food in the Last Year:   . Baristaan Out of Food in the Last Year:   Transportation Needs:   . Freight forwarderLack of Transportation (Medical):   Marland Kitchen. Lack of Transportation (Non-Medical):   Physical Activity:   . Days of Exercise per Week:   . Minutes of Exercise per Session:   Stress:   . Feeling of Stress :   Social Connections:   . Frequency of Communication with Friends and Family:   . Frequency of Social Gatherings with Friends and Family:   . Attends Religious Services:   . Active Member of Clubs or Organizations:   . Attends BankerClub or Organization Meetings:   Marland Kitchen. Marital Status:   Intimate Partner Violence:   . Fear of Current or Ex-Partner:   . Emotionally Abused:   Marland Kitchen. Physically Abused:   . Sexually Abused:     Family History  Problem Relation Age of Onset  .  Diabetes Mother   . Hypertension Mother   . Cancer Father   . Alcohol abuse Sister       Review of systems complete and found to be negative unless listed above      PHYSICAL EXAM  General: Well developed, well nourished, in no acute distress HEENT:  Normocephalic and atramatic Neck:  No JVD.  Lungs: Clear bilaterally to auscultation and percussion. Heart: HRRR . Normal S1 and S2 without gallops or murmurs.  Abdomen: Bowel sounds are positive, abdomen soft and non-tender  Msk:  Back normal, normal gait. Normal strength and tone for age. Extremities: No clubbing, cyanosis or edema.   Neuro: Alert and oriented X 3. Psych:  Good affect, responds appropriately  Labs:   Lab Results  Component Value Date    WBC 7.3 05/09/2019   HGB 7.7 (L) 05/09/2019   HCT 24.9 (L) 05/09/2019   MCV 71.8 (L) 05/09/2019   PLT 275 05/09/2019    Recent Labs  Lab 05/08/19 1215  NA 140  K 3.2*  CL 108  CO2 25  BUN 8  CREATININE 0.55  CALCIUM 9.1  PROT 7.8  BILITOT 0.8  ALKPHOS 93  ALT 6  AST 11*  GLUCOSE 109*   No results found for: CKTOTAL, CKMB, CKMBINDEX, TROPONINI  Lab Results  Component Value Date   CHOL 118 05/09/2019   CHOL 186 09/03/2017   Lab Results  Component Value Date   HDL 25 (L) 05/09/2019   HDL 53 09/03/2017   Lab Results  Component Value Date   LDLCALC 80 05/09/2019   LDLCALC 115 (H) 09/03/2017   Lab Results  Component Value Date   TRIG 64 05/09/2019   TRIG 90 09/03/2017   Lab Results  Component Value Date   CHOLHDL 4.7 05/09/2019   CHOLHDL 3.5 09/03/2017   No results found for: LDLDIRECT    Radiology: CT ANGIO HEAD W OR WO CONTRAST  Result Date: 05/09/2019 CLINICAL DATA:  Right upper extremity numbness and weakness. EXAM: CT ANGIOGRAPHY HEAD AND NECK TECHNIQUE: Multidetector CT imaging of the head and neck was performed using the standard protocol during bolus administration of intravenous contrast. Multiplanar CT image reconstructions and MIPs were obtained to evaluate the vascular anatomy. Carotid stenosis measurements (when applicable) are obtained utilizing NASCET criteria, using the distal internal carotid diameter as the denominator. CONTRAST:  78mL OMNIPAQUE IOHEXOL 350 MG/ML SOLN COMPARISON:  MRI of the brain May 09, 2019 FINDINGS: CT HEAD FINDINGS Brain: Few hypodense foci within left centrum semiovale in a watershed type distribution, corresponding to areas of restricted diffusion seen on prior MRI. No large acute territorial infarct. No intracranial hemorrhage, hydrocephalus, extra-axial collection on mass lesion. Vascular: No hyperdense vessel or unexpected calcification. Skull: Normal. Negative for fracture or focal lesion. Sinuses: Imaged portions are  clear. Orbits: No acute finding. Review of the MIP images confirms the above findings CTA NECK FINDINGS Aortic arch: Standard branching. Imaged portion shows no evidence of aneurysm or dissection. No significant stenosis of the major arch vessel origins. However, there is extreme tortuosity of the proximal major neck arteries, unusual for patient's age. Right carotid system: No evidence of dissection, stenosis or occlusion. Left carotid system: Atherosclerotic changes with soft plaque noted in the left carotid bifurcation resulting in 70% stenosis of the left ICA at the bulb. 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 Dominant left vertebral artery with increased tortuosity at the V1 segment without stenosis. Skeleton: Degenerative changes of the cervical  spine, more pronounced at C5-6 and C6-7, where there is at least mild spinal canal stenosis and bilateral neural foraminal narrowing. Other neck: Negative Upper chest: Prominent right paratracheal and paraortic lymph nodes. Review of the MIP images confirms the above findings CTA HEAD FINDINGS Anterior circulation: Mild asymmetry of the intracranial internal carotid arteries with decrease caliber of the left ICA, likely related to chronic stenosis at the neck. Right A1/ACA is dominant. No vessel occlusion or aneurysm identified. Posterior circulation: Dominant left vertebral artery. Mildly hypoplastic right P1/PCA segment related to the presence of prominent right posterior communicating artery. The basilar artery and posterior cerebral arteries are otherwise unremarkable. Venous sinuses: As permitted by contrast timing, patent. Anatomic variants: Hypoplastic right vertebral artery. Review of the MIP images confirms the above findings IMPRESSION: 1. 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 2. Hypoplastic right vertebral artery  with hairline stenosis at the origin. 3. No evidence of intracranial or cervical large vessel occlusion or aneurysm. 4. Prominent right paratracheal and paraortic lymph nodes. 5. Degenerative changes of the cervical spine, more pronounced at C5-6 and C6-7, where there is at least mild spinal canal stenosis and bilateral neural foraminal narrowing. Electronically Signed   By: Baldemar Lenis M.D.   On: 05/09/2019 12:46   CT HEAD WO CONTRAST  Result Date: 05/08/2019 CLINICAL DATA:  Numbness, possible stroke EXAM: CT HEAD WITHOUT CONTRAST TECHNIQUE: Contiguous axial images were obtained from the base of the skull through the vertex without intravenous contrast. COMPARISON:  None. FINDINGS: Brain: No definite evidence of acute infarction, hemorrhage, hydrocephalus, extra-axial collection or mass lesion/mass effect. Suspect small lacunar infarctions of the left caudate head and anterior limb of the right internal capsule/adjacent corona radiata (series 2, image 14). Vascular: No hyperdense vessel or unexpected calcification. Skull: Normal. Negative for fracture or focal lesion. Sinuses/Orbits: No acute finding. Other: None. IMPRESSION: No definite acute intracranial pathology. Suspect small lacunar infarctions of the left caudate head and anterior limb of the right internal capsule/adjacent corona radiata, generally age indeterminate. Consider MRI to more sensitively assess for acute diffusion restricting infarction if indicated by neurological signs and symptoms. Electronically Signed   By: Lauralyn Primes M.D.   On: 05/08/2019 12:54   CT ANGIO NECK W OR WO CONTRAST  Result Date: 05/09/2019 CLINICAL DATA:  Right upper extremity numbness and weakness. EXAM: CT ANGIOGRAPHY HEAD AND NECK TECHNIQUE: Multidetector CT imaging of the head and neck was performed using the standard protocol during bolus administration of intravenous contrast. Multiplanar CT image reconstructions and MIPs were obtained to  evaluate the vascular anatomy. Carotid stenosis measurements (when applicable) are obtained utilizing NASCET criteria, using the distal internal carotid diameter as the denominator. CONTRAST:  75mL OMNIPAQUE IOHEXOL 350 MG/ML SOLN COMPARISON:  MRI of the brain May 09, 2019 FINDINGS: CT HEAD FINDINGS Brain: Few hypodense foci within left centrum semiovale in a watershed type distribution, corresponding to areas of restricted diffusion seen on prior MRI. No large acute territorial infarct. No intracranial hemorrhage, hydrocephalus, extra-axial collection on mass lesion. Vascular: No hyperdense vessel or unexpected calcification. Skull: Normal. Negative for fracture or focal lesion. Sinuses: Imaged portions are clear. Orbits: No acute finding. Review of the MIP images confirms the above findings CTA NECK FINDINGS Aortic arch: Standard branching. Imaged portion shows no evidence of aneurysm or dissection. No significant stenosis of the major arch vessel origins. However, there is extreme tortuosity of the proximal  major neck arteries, unusual for patient's age. Right carotid system: No evidence of dissection, stenosis or occlusion. Left carotid system: Atherosclerotic changes with soft plaque noted in the left carotid bifurcation resulting in 70% stenosis of the left ICA at the bulb. 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 Dominant left vertebral artery with increased tortuosity at the V1 segment without stenosis. Skeleton: Degenerative changes of the cervical spine, more pronounced at C5-6 and C6-7, where there is at least mild spinal canal stenosis and bilateral neural foraminal narrowing. Other neck: Negative Upper chest: Prominent right paratracheal and paraortic lymph nodes. Review of the MIP images confirms the above findings CTA HEAD FINDINGS Anterior circulation: Mild asymmetry of the intracranial internal carotid arteries with decrease caliber of the left  ICA, likely related to chronic stenosis at the neck. Right A1/ACA is dominant. No vessel occlusion or aneurysm identified. Posterior circulation: Dominant left vertebral artery. Mildly hypoplastic right P1/PCA segment related to the presence of prominent right posterior communicating artery. The basilar artery and posterior cerebral arteries are otherwise unremarkable. Venous sinuses: As permitted by contrast timing, patent. Anatomic variants: Hypoplastic right vertebral artery. Review of the MIP images confirms the above findings IMPRESSION: 1. 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 2. 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 3. No evidence of intracranial or cervical large vessel occlusion or aneurysm. 4. Prominent right paratracheal and paraortic lymph nodes. 5. Degenerative changes of the cervical spine, more pronounced at C5-6 and C6-7, where there is at least mild spinal canal stenosis and bilateral neural foraminal narrowing. Electronically Signed   By: Baldemar Lenis M.D.   On: 05/09/2019 12:46   MR BRAIN WO CONTRAST  Result Date: 05/09/2019 CLINICAL DATA:  Right arm numbness. EXAM: MRI HEAD WITHOUT CONTRAST TECHNIQUE: Multiplanar, multiecho pulse sequences of the brain and surrounding structures were obtained without intravenous contrast. COMPARISON:  Head CT from yesterday FINDINGS: Brain: Cluster of small cortical and subcortical foci of restricted diffusion in the high left frontal and anterior parietal lobes. No hemorrhage, hydrocephalus, or masslike finding. Chronic lacune the anterior right corona radiata. Normal brain volume. Vascular: Normal flow voids. Skull and upper cervical spine: Normal marrow signal Sinuses/Orbits: Retention cyst appearance in the midline nasopharynx. No acute finding. IMPRESSION: 1. Cluster of small acute white matter and cortical  infarcts in the left MCA distribution. 2. 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 Electronically Signed   By: Marnee Spring M.D.   On: 05/09/2019 05:11   ECHOCARDIOGRAM COMPLETE BUBBLE STUDY  Result Date: 05/10/2019    ECHOCARDIOGRAM REPORT   Patient Name:   Kimberly Mcdonald Date of Exam: 05/09/2019 Medical Rec #:  [id number]             Height:       64.0 in Accession #:    [id number]            Weight:       200.0 lb Date of Birth:  [birth year]             BSA:          1.956 m Patient Age:    54 years              BP:           169/94 mmHg Patient Gender: F  HR:           80 bpm. Exam Location:  [facility] Procedure: 2D Echo Indications:     Stroke 434.91/I63.9  History:         Patient has no prior history of Echocardiogram examinations.                  Stroke; Risk Factors:Hypertension and Current Smoker. Asthma.  Sonographer:     Avanell Shackleton Referring Phys:  [id number] LESLIE REYNOLDS Diagnosing Phys: Nelva Bush MD IMPRESSIONS  1. Left ventricular ejection fraction, by estimation, is 55 to 60%. The left ventricle has normal function. The left ventricle has no regional wall motion abnormalities. There is mild left ventricular hypertrophy. Left ventricular diastolic parameters are consistent with Grade I diastolic dysfunction (impaired relaxation).  2. Right ventricular systolic function is normal. The right ventricular size is normal. There is normal pulmonary artery systolic pressure.  3. Left atrial size was moderately dilated.  4. Right atrial size was mildly dilated.  5. The mitral valve is normal in structure. Mild mitral valve regurgitation. No evidence of mitral stenosis.  6. The aortic valve is tricuspid. Aortic valve regurgitation is trivial. No aortic stenosis is present.  7. The inferior vena cava is normal in size with greater than 50% respiratory variability, suggesting right atrial pressure of 3 mmHg.  8. Agitated saline contrast bubble study was negative, with no  evidence of any interatrial shunt. FINDINGS  Left Ventricle: Left ventricular ejection fraction, by estimation, is 55 to 60%. The left ventricle has normal function. The left ventricle has no regional wall motion abnormalities. The left ventricular internal cavity size was normal in size. There is  mild left ventricular hypertrophy. Left ventricular diastolic parameters are consistent with Grade I diastolic dysfunction (impaired relaxation). Right Ventricle: The right ventricular size is normal. No increase in right ventricular wall thickness. Right ventricular systolic function is normal. There is normal pulmonary artery systolic pressure. The tricuspid regurgitant velocity is 2.81 m/s, and  with an assumed right atrial pressure of 3 mmHg, the estimated right ventricular systolic pressure is 84.6 mmHg. Left Atrium: Left atrial size was moderately dilated. Right Atrium: Right atrial size was mildly dilated. Pericardium: Trivial pericardial effusion is present. The pericardial effusion is posterior to the left ventricle. Mitral Valve: The mitral valve is normal in structure. Mild mitral valve regurgitation. No evidence of mitral valve stenosis. Tricuspid Valve: The tricuspid valve is grossly normal. Tricuspid valve regurgitation is mild. Aortic Valve: The aortic valve is tricuspid. Aortic valve regurgitation is trivial. No aortic stenosis is present. Pulmonic Valve: The pulmonic valve was normal in structure. Pulmonic valve regurgitation is trivial. No evidence of pulmonic stenosis. Aorta: The aortic root is normal in size and structure. Pulmonary Artery: The pulmonary artery is of normal size. Venous: The inferior vena cava is normal in size with greater than 50% respiratory variability, suggesting right atrial pressure of 3 mmHg. IAS/Shunts: No atrial level shunt detected by color flow Doppler. Agitated saline contrast was given intravenously to evaluate for intracardiac shunting. Agitated saline contrast bubble  study was negative, with no evidence of any interatrial shunt.  LEFT VENTRICLE PLAX 2D LVIDd:         4.44 cm  Diastology LVIDs:         3.15 cm  LV e' lateral:   8.59 cm/s LV PW:         0.98 cm  LV E/e' lateral: 11.6 LV IVS:  1.09 cm  LV e' medial:    7.18 cm/s LVOT diam:     2.00 cm  LV E/e' medial:  13.9 LVOT Area:     3.14 cm  RIGHT VENTRICLE             IVC RV S prime:     14.40 cm/s  IVC diam: 1.69 cm TAPSE (M-mode): 2.9 cm LEFT ATRIUM             Index       RIGHT ATRIUM           Index LA diam:        3.80 cm 1.94 cm/m  RA Area:     18.90 cm LA Vol (A2C):   98.8 ml 50.50 ml/m RA Volume:   52.50 ml  26.84 ml/m LA Vol (A4C):   82.7 ml 42.27 ml/m LA Biplane Vol: 90.5 ml 46.26 ml/m   AORTA Ao Root diam: 3.30 cm MITRAL VALVE                TRICUSPID VALVE MV Area (PHT): 3.76 cm     TR Peak grad:   31.6 mmHg MV Decel Time: 202 msec     TR Vmax:        281.00 cm/s MR Peak grad: 180.3 mmHg MR Mean grad: 126.3 mmHg    SHUNTS MR Vmax:      671.33 cm/s   Systemic Diam: 2.00 cm MR Vmean:     542.3 cm/s MV E velocity: 99.90 cm/s MV A velocity: 126.00 cm/s MV E/A ratio:  0.79 Christopher End MD Electronically signed by Yvonne Kendall MD Signature Date/Time: 05/10/2019/6:29:35 AM    Final     EKG: Normal sinus rhythm nonspecific ST-T wave changes  ASSESSMENT AND PLAN:  Preoperative clearance for vascular procedure TIA/CVA on imaging Anemia profound Hypokalemia Anemia Hypertension Smoking . Plan Agree with preoperative assessment and modification Continue transfusions to hemoglobin hopefully of around 9-10 Agree with GERD therapy omeprazole or Protonix with famotidine Continue packed red blood cells as well as iron therapy Agree with neurology involvement therapy Proceed with CEA procedure with vascular Consider long-term anticoagulation with aspirin and/or Plavix postop Advised patient to refrain from tobacco abuse Continue blood pressure management Correct  electrolytes  Signed: Alwyn Pea MD 05/11/2019, 7:56 AM

## 2019-05-11 NOTE — Progress Notes (Signed)
PROGRESS NOTE    Kimberly Mcdonald  TIW:[id number] DOB: 1965-04-14 DOA: 05/08/2019 PCP: Patient, No Pcp Per    Brief Narrative:   Kimberly Mcdonald is a 54 y.o. female with medical history significant of hypertension, substance abuse, tobacco abuse, who presents with dark stool on the right arm numbness.    Consultants:   GI, neurology, vascular surgery, cardiology  Procedures:  Antimicrobials:       Subjective: Pt without complaints.  Objective: Vitals:   05/10/19 2013 05/11/19 0529 05/11/19 0849 05/11/19 1251  BP: (!) 142/97 137/82 (!) 146/96 131/75  Pulse: 87 80 90 86  Resp: 20 20  16   Temp: 99.1 F (37.3 C) 98.7 F (37.1 C) 97.9 F (36.6 C) 98.6 F (37 C)  TempSrc: Oral Oral Oral Oral  SpO2: 99% 93% 96% 96%  Weight:      Height:        Intake/Output Summary (Last 24 hours) at 05/11/2019 1446 Last data filed at 05/11/2019 1300 Gross per 24 hour  Intake 421.6 ml  Output 700 ml  Net -278.4 ml   Filed Weights   05/08/19 1204  Weight: 90.7 kg    Examination:  General exam: Appears calm and comfortable  Respiratory system: Clear to auscultation. Respiratory effort normal. Cardiovascular system: S1 & S2 heard, RRR. No JVD, murmurs, rubs, gallops or clicks.  Gastrointestinal system: Abdomen is nondistended, soft and nontender. Normal bowel sounds heard. Central nervous system: Alert and oriented. Grossly intact Extremities: no edema Skin: Warm and dry Psychiatry: Mood & affect appropriate in current setting.     Data Reviewed: I have personally reviewed following labs and imaging studies  CBC: Recent Labs  Lab 05/08/19 1215 05/08/19 1215 05/08/19 1427 05/09/19 0352 05/09/19 0904 05/09/19 1345 05/11/19 1219  WBC 9.1   < > 9.1 7.1 7.1 7.3 12.0*  NEUTROABS 6.6  --   --   --   --   --   --   HGB 4.0*   < > 3.8* 6.1* 7.4* 7.7* 8.3*  HCT 16.0*   < > 15.2* 21.3* 24.4* 24.9* 28.5*  MCV 62.7*   < > 62.0* 72.2* 72.0* 71.8* 74.4*  PLT 428*    < > 341 251 274 275 239   < > = values in this interval not displayed.   Basic Metabolic Panel: Recent Labs  Lab 05/08/19 1215 05/09/19 0352 05/11/19 1219  NA 140  --  139  K 3.2*  --  3.4*  CL 108  --  108  CO2 25  --  24  GLUCOSE 109*  --  90  BUN 8  --  5*  CREATININE 0.55  --  0.54  CALCIUM 9.1  --  8.8*  MG  --  2.0  --    GFR: Estimated Creatinine Clearance: 86.7 mL/min (by C-G formula based on SCr of 0.54 mg/dL). Liver Function Tests: Recent Labs  Lab 05/08/19 1215  AST 11*  ALT 6  ALKPHOS 93  BILITOT 0.8  PROT 7.8  ALBUMIN 3.6   No results for input(s): LIPASE, AMYLASE in the last 168 hours. No results for input(s): AMMONIA in the last 168 hours. Coagulation Profile: Recent Labs  Lab 05/08/19 1215  INR 1.0   Cardiac Enzymes: No results for input(s): CKTOTAL, CKMB, CKMBINDEX, TROPONINI in the last 168 hours. BNP (last 3 results) No results for input(s): PROBNP in the last 8760 hours. HbA1C: Recent Labs    05/09/19 0352  HGBA1C 5.2   CBG:  No results for input(s): GLUCAP in the last 168 hours. Lipid Profile: Recent Labs    05/09/19 0352  CHOL 118  HDL 25*  LDLCALC 80  TRIG 64  CHOLHDL 4.7   Thyroid Function Tests: No results for input(s): TSH, T4TOTAL, FREET4, T3FREE, THYROIDAB in the last 72 hours. Anemia Panel: Recent Labs    05/09/19 1718  VITAMINB12 212   Sepsis Labs: No results for input(s): PROCALCITON, LATICACIDVEN in the last 168 hours.  Recent Results (from the past 240 hour(s))  Respiratory Panel by RT PCR (Flu A&B, Covid) - Nasopharyngeal Swab     Status: None   Collection Time: 05/08/19  1:22 PM   Specimen: Nasopharyngeal Swab  Result Value Ref Range Status   SARS Coronavirus 2 by RT PCR NEGATIVE NEGATIVE Final    Comment: (NOTE) SARS-CoV-2 target nucleic acids are NOT DETECTED. The SARS-CoV-2 RNA is generally detectable in upper respiratoy specimens during the acute phase of infection. The lowest concentration of  SARS-CoV-2 viral copies this assay can detect is 131 copies/mL. A negative result does not preclude SARS-Cov-2 infection and should not be used as the sole basis for treatment or other patient management decisions. A negative result may occur with  improper specimen collection/handling, submission of specimen other than nasopharyngeal swab, presence of viral mutation(s) within the areas targeted by this assay, and inadequate number of viral copies (<131 copies/mL). A negative result must be combined with clinical observations, patient history, and epidemiological information. The expected result is Negative. Fact Sheet for Patients:  https://www.moore.com/ Fact Sheet for Healthcare Providers:  https://www.young.biz/ This test is not yet ap proved or cleared by the [place] FDA and  has been authorized for detection and/or diagnosis of SARS-CoV-2 by FDA under an Emergency Use Authorization (EUA). This EUA will remain  in effect (meaning this test can be used) for the duration of the COVID-19 declaration under Section 564(b)(1) of the Act, 21 U.S.C. section 360bbb-3(b)(1), unless the authorization is terminated or revoked sooner.    Influenza A by PCR NEGATIVE NEGATIVE Final   Influenza B by PCR NEGATIVE NEGATIVE Final    Comment: (NOTE) The Xpert Xpress SARS-CoV-2/FLU/RSV assay is intended as an aid in  the diagnosis of influenza from Nasopharyngeal swab specimens and  should not be used as a sole basis for treatment. Nasal washings and  aspirates are unacceptable for Xpert Xpress SARS-CoV-2/FLU/RSV  testing. Fact Sheet for Patients: https://www.moore.com/ Fact Sheet for Healthcare Providers: https://www.young.biz/ This test is not yet approved or cleared by the [place] FDA and  has been authorized for detection and/or diagnosis of SARS-CoV-2 by  FDA under an Emergency Use Authorization (EUA).  This EUA will remain  in effect (meaning this test can be used) for the duration of the  Covid-19 declaration under Section 564(b)(1) of the Act, 21  U.S.C. section 360bbb-3(b)(1), unless the authorization is  terminated or revoked. Performed at [facility], [address], [place], [place] [address]          Radiology Studies: ECHOCARDIOGRAM COMPLETE BUBBLE STUDY  Result Date: 05/10/2019    ECHOCARDIOGRAM REPORT   Patient Name:   Kimberly Mcdonald Date of Exam: 05/09/2019 Medical Rec #:  [id number]             Height:       64.0 in Accession #:    [id number]            Weight:       200.0 lb Date of Birth:  1965-08-20             BSA:          1.956 m Patient Age:    54 years              BP:           169/94 mmHg Patient Gender: F                     HR:           80 bpm. Exam Location:  [facility] Procedure: 2D Echo Indications:     Stroke 434.91/I63.9  History:         Patient has no prior history of Echocardiogram examinations.                  Stroke; Risk Factors:Hypertension and Current Smoker. Asthma.  Sonographer:     Johnathan Hausen Referring Phys:  [id number] [place] Diagnosing Phys: Yvonne Kendall MD IMPRESSIONS  1. Left ventricular ejection fraction, by estimation, is 55 to 60%. The left ventricle has normal function. The left ventricle has no regional wall motion abnormalities. There is mild left ventricular hypertrophy. Left ventricular diastolic parameters are consistent with Grade I diastolic dysfunction (impaired relaxation).  2. Right ventricular systolic function is normal. The right ventricular size is normal. There is normal pulmonary artery systolic pressure.  3. Left atrial size was moderately dilated.  4. Right atrial size was mildly dilated.  5. The mitral valve is normal in structure. Mild mitral valve regurgitation. No evidence of mitral stenosis.  6. The aortic valve is tricuspid. Aortic valve regurgitation is trivial. No aortic stenosis is present.  7. The  inferior vena cava is normal in size with greater than 50% respiratory variability, suggesting right atrial pressure of 3 mmHg.  8. Agitated saline contrast bubble study was negative, with no evidence of any interatrial shunt. FINDINGS  Left Ventricle: Left ventricular ejection fraction, by estimation, is 55 to 60%. The left ventricle has normal function. The left ventricle has no regional wall motion abnormalities. The left ventricular internal cavity size was normal in size. There is  mild left ventricular hypertrophy. Left ventricular diastolic parameters are consistent with Grade I diastolic dysfunction (impaired relaxation). Right Ventricle: The right ventricular size is normal. No increase in right ventricular wall thickness. Right ventricular systolic function is normal. There is normal pulmonary artery systolic pressure. The tricuspid regurgitant velocity is 2.81 m/s, and  with an assumed right atrial pressure of 3 mmHg, the estimated right ventricular systolic pressure is 34.6 mmHg. Left Atrium: Left atrial size was moderately dilated. Right Atrium: Right atrial size was mildly dilated. Pericardium: Trivial pericardial effusion is present. The pericardial effusion is posterior to the left ventricle. Mitral Valve: The mitral valve is normal in structure. Mild mitral valve regurgitation. No evidence of mitral valve stenosis. Tricuspid Valve: The tricuspid valve is grossly normal. Tricuspid valve regurgitation is mild. Aortic Valve: The aortic valve is tricuspid. Aortic valve regurgitation is trivial. No aortic stenosis is present. Pulmonic Valve: The pulmonic valve was normal in structure. Pulmonic valve regurgitation is trivial. No evidence of pulmonic stenosis. Aorta: The aortic root is normal in size and structure. Pulmonary Artery: The pulmonary artery is of normal size. Venous: The inferior vena cava is normal in size with greater than 50% respiratory variability, suggesting right atrial pressure of 3  mmHg. IAS/Shunts: No atrial level shunt detected by color flow Doppler. Agitated saline contrast was given intravenously to  evaluate for intracardiac shunting. Agitated saline contrast bubble study was negative, with no evidence of any interatrial shunt.  LEFT VENTRICLE PLAX 2D LVIDd:         4.44 cm  Diastology LVIDs:         3.15 cm  LV e' lateral:   8.59 cm/s LV PW:         0.98 cm  LV E/e' lateral: 11.6 LV IVS:        1.09 cm  LV e' medial:    7.18 cm/s LVOT diam:     2.00 cm  LV E/e' medial:  13.9 LVOT Area:     3.14 cm  RIGHT VENTRICLE             IVC RV S prime:     14.40 cm/s  IVC diam: 1.69 cm TAPSE (M-mode): 2.9 cm LEFT ATRIUM             Index       RIGHT ATRIUM           Index LA diam:        3.80 cm 1.94 cm/m  RA Area:     18.90 cm LA Vol (A2C):   98.8 ml 50.50 ml/m RA Volume:   52.50 ml  26.84 ml/m LA Vol (A4C):   82.7 ml 42.27 ml/m LA Biplane Vol: 90.5 ml 46.26 ml/m   AORTA Ao Root diam: 3.30 cm MITRAL VALVE                TRICUSPID VALVE MV Area (PHT): 3.76 cm     TR Peak grad:   31.6 mmHg MV Decel Time: 202 msec     TR Vmax:        281.00 cm/s MR Peak grad: 180.3 mmHg MR Mean grad: 126.3 mmHg    SHUNTS MR Vmax:      671.33 cm/s   Systemic Diam: 2.00 cm MR Vmean:     542.3 cm/s MV E velocity: 99.90 cm/s MV A velocity: 126.00 cm/s MV E/A ratio:  0.79 Cristal Deer End MD Electronically signed by Yvonne Kendall MD Signature Date/Time: 05/10/2019/6:29:35 AM    Final         Scheduled Meds: .  stroke: mapping our early stages of recovery book   Does not apply Once  . amLODipine  10 mg Oral Daily  . atorvastatin  40 mg Oral Daily  . ferrous sulfate  325 mg Oral BID WC  . nicotine  21 mg Transdermal Daily  . pantoprazole  40 mg Intravenous Q12H  . polyethylene glycol-electrolytes  4,000 mL Oral Once  . vitamin B-12  500 mcg Oral Daily   Continuous Infusions: . sodium chloride Stopped (05/10/19 1713)  . iron sucrose 200 mg (05/10/19 1700)    Assessment & Plan:   Principal  Problem:   GIB (gastrointestinal bleeding) Active Problems:   Hypertension   Right arm numbness   Symptomatic anemia   Tobacco abuse   Hypokalemia   Cerebrovascular accident (CVA) (HCC)   GIB (gastrointestinal bleeding)and anemia due to blood loss: -s/p 4Units PRBC H/h stable -Dr. Tobi Bastos of GI is consulted-- In the setting of new stroke G.I. plans to hold off on any luminal evaluation for few weeks -start patient on oral and IV iron -vitamin B12 orally -PPI bid -patient reports on of NSAIDs not as much as she used to take before   Anemia: iron deficiency with B12 -started on oral and IV iron -oral B12 -patient will see  Dr. Smith Robertao as outpatient.  Hypertension:pt is not taking Bp med.  -IVhydralazine as needed -on oral amlodipine Better controlled   Acute left MCA CVA -pt came in with Right arm numbness. Risk factor smoking, marijuana use, hypertension untreated - CT of head showed possible lacunar stroke -MRI brain Cluster of small acute white matter and cortical infarcts in the left MCA distribution. 2. 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 Thad Rangere[place]. Given G.I. bleeding no antiplatelet agent at present.  Left ICA stenosis 70-99% -vascular surgery input appreciated, plan for carotid endarterectomy on Friday with Dr. Gilda CreaseSchnier.  Tobacco and chronic marijuana abuse (>30 years) -Did counseling about importance of quitting smoking -Nicotine patch -urine drug screen positive for marijuana -pt advised on abstaining from marijuana  Hypokalemia: K=3.4, will replete.  - Mg level 2.0    DVT prophylaxis: SCD Code Status: Full Family Communication: None at bedside Disposition Plan: Patient is undergoing left carotid endarterectomy Barrier:Plan for Lt carotid endarterectomy on friday       LOS: 3 days   Time spent: 45 minutes with more than 50% COC    Lynn ItoSahar Brodee Mauritz, MD [facility] Pager  [phone]  If 7PM-7AM, please contact night-coverage [email or web address] Password [email or web address] 05/11/2019, 2:46 PM

## 2019-05-12 ENCOUNTER — Other Ambulatory Visit (INDEPENDENT_AMBULATORY_CARE_PROVIDER_SITE_OTHER): Payer: Self-pay | Admitting: Vascular Surgery

## 2019-05-12 LAB — PROTHROMBIN GENE MUTATION

## 2019-05-12 LAB — POTASSIUM: Potassium: 3.1 mmol/L — ABNORMAL LOW (ref 3.5–5.1)

## 2019-05-12 LAB — HEMOGLOBIN AND HEMATOCRIT, BLOOD
HCT: 26.2 % — ABNORMAL LOW (ref 36.0–46.0)
Hemoglobin: 7.7 g/dL — ABNORMAL LOW (ref 12.0–15.0)

## 2019-05-12 MED ORDER — PANTOPRAZOLE SODIUM 40 MG PO TBEC
40.0000 mg | DELAYED_RELEASE_TABLET | Freq: Two times a day (BID) | ORAL | Status: DC
  Administered 2019-05-12: 40 mg via ORAL
  Filled 2019-05-12: qty 1

## 2019-05-12 MED ORDER — POTASSIUM CHLORIDE CRYS ER 20 MEQ PO TBCR
40.0000 meq | EXTENDED_RELEASE_TABLET | Freq: Once | ORAL | Status: AC
  Administered 2019-05-12: 40 meq via ORAL
  Filled 2019-05-12: qty 2

## 2019-05-12 MED ORDER — SODIUM CHLORIDE 0.9 % IV SOLN: INTRAVENOUS | Status: DC

## 2019-05-12 NOTE — Progress Notes (Signed)
PROGRESS NOTE    Kimberly Mcdonald  POE:[id number] DOB: 1965/01/29 DOA: 05/08/2019 PCP: Patient, No Pcp Per    Brief Narrative:   Kimberly Mcdonald is a 54 y.o. female with medical history significant of hypertension, substance abuse, tobacco abuse, who presents with dark stool on the right arm numbness.    Consultants:   GI, neurology, vascular surgery, cardiology  Procedures:  Antimicrobials:       Subjective: Pt without complaints.  No dizziness  Objective: Vitals:   05/11/19 0849 05/11/19 1251 05/11/19 2044 05/12/19 0440  BP: (!) 146/96 131/75 125/68 (!) 123/58  Pulse: 90 86 92 81  Resp:  16 19 18   Temp: 97.9 F (36.6 C) 98.6 F (37 C) 98.9 F (37.2 C) 98.4 F (36.9 C)  TempSrc: Oral Oral Oral Oral  SpO2: 96% 96% 94% 92%  Weight:      Height:        Intake/Output Summary (Last 24 hours) at 05/12/2019 0925 Last data filed at 05/12/2019 0500 Gross per 24 hour  Intake 120 ml  Output 300 ml  Net -180 ml   Filed Weights   05/08/19 1204  Weight: 90.7 kg    Examination:  General exam: Appears calm and comfortable , and Respiratory system: Clear to auscultation. Respiratory effort normal. Cardiovascular system: S1 & S2 heard, RRR. No JVD, murmurs, rubs, gallops or clicks.  Gastrointestinal system: Abdomen is nondistended, soft and nontender. Normal bowel sounds heard. Central nervous system: Alert and oriented. Grossly intact Extremities: no edema Skin: Warm and dry Psychiatry: Mood & affect appropriate in current setting.     Data Reviewed: I have personally reviewed following labs and imaging studies  CBC: Recent Labs  Lab 05/08/19 1215 05/08/19 1215 05/08/19 1427 05/08/19 1427 05/09/19 0352 05/09/19 0904 05/09/19 1345 05/11/19 1219 05/12/19 0518  WBC 9.1   < > 9.1  --  7.1 7.1 7.3 12.0*  --   NEUTROABS 6.6  --   --   --   --   --   --   --   --   HGB 4.0*   < > 3.8*   < > 6.1* 7.4* 7.7* 8.3* 7.7*  HCT 16.0*   < > 15.2*   < >  21.3* 24.4* 24.9* 28.5* 26.2*  MCV 62.7*   < > 62.0*  --  72.2* 72.0* 71.8* 74.4*  --   PLT 428*   < > 341  --  251 274 275 239  --    < > = values in this interval not displayed.   Basic Metabolic Panel: Recent Labs  Lab 05/08/19 1215 05/09/19 0352 05/11/19 1219 05/12/19 0518  NA 140  --  139  --   K 3.2*  --  3.4* 3.1*  CL 108  --  108  --   CO2 25  --  24  --   GLUCOSE 109*  --  90  --   BUN 8  --  5*  --   CREATININE 0.55  --  0.54  --   CALCIUM 9.1  --  8.8*  --   MG  --  2.0  --   --    GFR: Estimated Creatinine Clearance: 86.7 mL/min (by C-G formula based on SCr of 0.54 mg/dL). Liver Function Tests: Recent Labs  Lab 05/08/19 1215  AST 11*  ALT 6  ALKPHOS 93  BILITOT 0.8  PROT 7.8  ALBUMIN 3.6   No results for input(s): LIPASE, AMYLASE in the  last 168 hours. No results for input(s): AMMONIA in the last 168 hours. Coagulation Profile: Recent Labs  Lab 05/08/19 1215  INR 1.0   Cardiac Enzymes: No results for input(s): CKTOTAL, CKMB, CKMBINDEX, TROPONINI in the last 168 hours. BNP (last 3 results) No results for input(s): PROBNP in the last 8760 hours. HbA1C: No results for input(s): HGBA1C in the last 72 hours. CBG: No results for input(s): GLUCAP in the last 168 hours. Lipid Profile: No results for input(s): CHOL, HDL, LDLCALC, TRIG, CHOLHDL, LDLDIRECT in the last 72 hours. Thyroid Function Tests: No results for input(s): TSH, T4TOTAL, FREET4, T3FREE, THYROIDAB in the last 72 hours. Anemia Panel: Recent Labs    05/09/19 1718  VITAMINB12 212   Sepsis Labs: No results for input(s): PROCALCITON, LATICACIDVEN in the last 168 hours.  Recent Results (from the past 240 hour(s))  Respiratory Panel by RT PCR (Flu A&B, Covid) - Nasopharyngeal Swab     Status: None   Collection Time: 05/08/19  1:22 PM   Specimen: Nasopharyngeal Swab  Result Value Ref Range Status   SARS Coronavirus 2 by RT PCR NEGATIVE NEGATIVE Final    Comment: (NOTE) SARS-CoV-2  target nucleic acids are NOT DETECTED. The SARS-CoV-2 RNA is generally detectable in upper respiratoy specimens during the acute phase of infection. The lowest concentration of SARS-CoV-2 viral copies this assay can detect is 131 copies/mL. A negative result does not preclude SARS-Cov-2 infection and should not be used as the sole basis for treatment or other patient management decisions. A negative result may occur with  improper specimen collection/handling, submission of specimen other than nasopharyngeal swab, presence of viral mutation(s) within the areas targeted by this assay, and inadequate number of viral copies (<131 copies/mL). A negative result must be combined with clinical observations, patient history, and epidemiological information. The expected result is Negative. Fact Sheet for Patients:  PinkCheek.be Fact Sheet for Healthcare Providers:  GravelBags.it This test is not yet ap proved or cleared by the [place] FDA and  has been authorized for detection and/or diagnosis of SARS-CoV-2 by FDA under an Emergency Use Authorization (EUA). This EUA will remain  in effect (meaning this test can be used) for the duration of the COVID-19 declaration under Section 564(b)(1) of the Act, 21 U.S.C. section 360bbb-3(b)(1), unless the authorization is terminated or revoked sooner.    Influenza A by PCR NEGATIVE NEGATIVE Final   Influenza B by PCR NEGATIVE NEGATIVE Final    Comment: (NOTE) The Xpert Xpress SARS-CoV-2/FLU/RSV assay is intended as an aid in  the diagnosis of influenza from Nasopharyngeal swab specimens and  should not be used as a sole basis for treatment. Nasal washings and  aspirates are unacceptable for Xpert Xpress SARS-CoV-2/FLU/RSV  testing. Fact Sheet for Patients: PinkCheek.be Fact Sheet for Healthcare Providers: GravelBags.it This test is  not yet approved or cleared by the [place] FDA and  has been authorized for detection and/or diagnosis of SARS-CoV-2 by  FDA under an Emergency Use Authorization (EUA). This EUA will remain  in effect (meaning this test can be used) for the duration of the  Covid-19 declaration under Section 564(b)(1) of the Act, 21  U.S.C. section 360bbb-3(b)(1), unless the authorization is  terminated or revoked. Performed at [facility], [address] [place], [place] [address]          Radiology Studies: No results found.      Scheduled Meds: .  stroke: mapping our early stages of recovery book   Does  not apply Once  . amLODipine  10 mg Oral Daily  . atorvastatin  40 mg Oral Daily  . ferrous sulfate  325 mg Oral BID WC  . nicotine  21 mg Transdermal Daily  . pantoprazole  40 mg Intravenous Q12H  . polyethylene glycol-electrolytes  4,000 mL Oral Once  . vitamin B-12  500 mcg Oral Daily   Continuous Infusions: . sodium chloride Stopped (05/10/19 1713)  . iron sucrose 200 mg (05/11/19 1556)    Assessment & Plan:   Principal Problem:   GIB (gastrointestinal bleeding) Active Problems:   Hypertension   Right arm numbness   Symptomatic anemia   Tobacco abuse   Hypokalemia   Cerebrovascular accident (CVA) (HCC)   GIB (gastrointestinal bleeding)and anemia due to blood loss: -s/p 4Units PRBC H/h stable -Dr. Tobi Bastos of GI is consulted-- In the setting of new stroke G.I. plans to hold off on any luminal evaluation for few weeks -received  oral and IV iron -vitamin B12 orally -PPI bid changed to PO from IV -patient reports on of NSAIDs not as much as she used to take before   Anemia: iron deficiency with B12 -started on oral and IV iron -oral B12 -patient will see Dr. Smith Robert as outpatient.  Hypertension: controlled -IVhydralazine as needed -on oral amlodipine Better controlled   Acute left MCA CVA -pt came in with Right arm numbness. Risk factor  smoking, marijuana use, hypertension untreated - CT of head showed possible lacunar stroke -MRI brain Cluster of small acute white matter and cortical infarcts in the left MCA distribution. 2. 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 Thad Ranger. Given G.I. bleeding no antiplatelet agent at present.  Left ICA stenosis 70-99% -vascular surgery input appreciated, plan for carotid endarterectomy on Friday with Dr. Gilda Crease. Cardiology cleared pt for CEA as mild to mod risk.  Tobacco and chronic marijuana abuse (>30 years) -Did counseling about importance of quitting smoking -Nicotine patch -urine drug screen positive for marijuana -pt advised on abstaining from marijuana  Hypokalemia: repleted today - Mg level 2.0    DVT prophylaxis: SCD Code Status: Full Family Communication: None at bedside Disposition Plan: Patient is undergoing left carotid endarterectomy Barrier:Plan for Lt carotid endarterectomy on friday       LOS: 4 days   Time spent: 45 minutes with more than 50% COC    Lynn Ito, MD [facility] Pager [phone]  If 7PM-7AM, please contact night-coverage [email or web address] Password [facility] 05/12/2019, 9:25 AM Patient ID: [name], female   DOB: 1965/11/27, 54 y.o.   MRN: [id number]

## 2019-05-12 NOTE — Progress Notes (Signed)
[facility] Cardiology    SUBJECTIVE: States to be doing reasonably well still denies any further numbness no weakness patient is ready for surgical procedure tomorrow with Dr. Odella Aquas no chest pain no palpitations or tachycardia   Vitals:   05/11/19 1251 05/11/19 2044 05/12/19 0440 05/12/19 1148  BP: 131/75 125/68 (!) 123/58 129/74  Pulse: 86 92 81 86  Resp: 16 19 18    Temp: 98.6 F (37 C) 98.9 F (37.2 C) 98.4 F (36.9 C) 99 F (37.2 C)  TempSrc: Oral Oral Oral Oral  SpO2: 96% 94% 92% 95%  Weight:      Height:         Intake/Output Summary (Last 24 hours) at 05/12/2019 1655 Last data filed at 05/12/2019 1225 Gross per 24 hour  Intake 360 ml  Output 700 ml  Net -340 ml      PHYSICAL EXAM  General: Well developed, well nourished, in no acute distress HEENT:  Normocephalic and atramatic Neck:  No JVD.  Lungs: Clear bilaterally to auscultation and percussion. Heart: HRRR . Normal S1 and S2 without gallops or murmurs.  Abdomen: Bowel sounds are positive, abdomen soft and non-tender  Msk:  Back normal, normal gait. Normal strength and tone for age. Extremities: No clubbing, cyanosis or edema.   Neuro: Alert and oriented X 3. Psych:  Good affect, responds appropriately   LABS: Basic Metabolic Panel: Recent Labs    05/11/19 1219 05/12/19 0518  NA 139  --   K 3.4* 3.1*  CL 108  --   CO2 24  --   GLUCOSE 90  --   BUN 5*  --   CREATININE 0.54  --   CALCIUM 8.8*  --    Liver Function Tests: No results for input(s): AST, ALT, ALKPHOS, BILITOT, PROT, ALBUMIN in the last 72 hours. No results for input(s): LIPASE, AMYLASE in the last 72 hours. CBC: Recent Labs    05/11/19 1219 05/12/19 0518  WBC 12.0*  --   HGB 8.3* 7.7*  HCT 28.5* 26.2*  MCV 74.4*  --   PLT 239  --    Cardiac Enzymes: No results for input(s): CKTOTAL, CKMB, CKMBINDEX, TROPONINI in the last 72 hours. BNP: Invalid input(s): POCBNP D-Dimer: No results for input(s): DDIMER in the last 72  hours. Hemoglobin A1C: No results for input(s): HGBA1C in the last 72 hours. Fasting Lipid Panel: No results for input(s): CHOL, HDL, LDLCALC, TRIG, CHOLHDL, LDLDIRECT in the last 72 hours. Thyroid Function Tests: No results for input(s): TSH, T4TOTAL, T3FREE, THYROIDAB in the last 72 hours.  Invalid input(s): FREET3 Anemia Panel: Recent Labs    05/09/19 1718  VITAMINB12 212    No results found.   Echo normal overall left ventricular function  TELEMETRY: Normal sinus rhythm nonspecific ST-T wave changes  ASSESSMENT AND PLAN:  Principal Problem:   GIB (gastrointestinal bleeding) Active Problems:   Hypertension   Right arm numbness   Symptomatic anemia   Tobacco abuse   Hypokalemia   Cerebrovascular accident (CVA) (HCC)    Plan  Patient is cleared for CEA as a mild to mod risk Recommmed surgery for PVD with CEA POC for CEA Tomorrow with [place]ier HTN continue current therapy Hyperlipidemia maintain statin therapy crestor 40 mg daily TIA rec ASA/Plavix post oP Smoking advised patient refrain from tobacco abuse Recommend weight loss exercise portion control Will continue to follow conservatively for now Have the patient follow-up as an outpatient   05/11/19, MD 05/12/2019 4:55 PM

## 2019-05-12 NOTE — Progress Notes (Signed)
OT Cancellation Note  Patient Details Name: Kimberly Mcdonald MRN: [id number] DOB: [birth year]   Cancelled Treatment:    Reason Eval/Treat Not Completed: Patient declined, no reason specified. Pt politely denies any needs. Plan for carotid endarterectomy tomorrow, 04/3019. Will re-attempt OT treatment after surgery pending continue at transfer or new OT orders.   Richrd Prime, MPH, MS, OTR/L ascom [phone] 05/12/19, 3:35 PM

## 2019-05-12 NOTE — Progress Notes (Signed)
[facility] Daily Progress Note   Subjective: Patient without complaint this AM.  No issues overnight.   Objective: Vitals:   05/11/19 0849 05/11/19 1251 05/11/19 2044 05/12/19 0440  BP: (!) 146/96 131/75 125/68 (!) 123/58  Pulse: 90 86 92 81  Resp:  16 19 18   Temp: 97.9 F (36.6 C) 98.6 F (37 C) 98.9 F (37.2 C) 98.4 F (36.9 C)  TempSrc: Oral Oral Oral Oral  SpO2: 96% 96% 94% 92%  Weight:      Height:        Intake/Output Summary (Last 24 hours) at 05/12/2019 1037 Last data filed at 05/12/2019 1015 Gross per 24 hour  Intake 480 ml  Output 300 ml  Net 180 ml   Physical Exam: A&Ox3, NAD CV: RRR Pulmonary: CTA Bilaterally Abdomen: Soft, Nontender, Nondistended Vascular: Warm distally to toes, minimal edema Neuro: intact, no deficits   Laboratory: CBC    Component Value Date/Time   WBC 12.0 (H) 05/11/2019 1219   HGB 7.7 (L) 05/12/2019 0518   HGB 11.1 09/03/2017 1854   HCT 26.2 (L) 05/12/2019 0518   HCT 34.5 09/03/2017 1854   PLT 239 05/11/2019 1219   PLT 325 09/03/2017 1854   BMET    Component Value Date/Time   NA 139 05/11/2019 1219   NA 141 09/03/2017 1854   K 3.1 (L) 05/12/2019 0518   CL 108 05/11/2019 1219   CO2 24 05/11/2019 1219   GLUCOSE 90 05/11/2019 1219   BUN 5 (L) 05/11/2019 1219   BUN 20 09/03/2017 1854   CREATININE 0.54 05/11/2019 1219   CALCIUM 8.8 (L) 05/11/2019 1219   GFRNONAA >60 05/11/2019 1219   GFRAA >60 05/11/2019 1219   Assessment/Planning: The patient is a 54 year old female with significant left carotid artery stenosis for left carotid endarterectomy tomorrow 1) cardiac cleared by cardiology 2) patient still consenting to surgery tomorrow 3) will pre-op  Discussed with Dr. 53 Miklos Bidinger PA-C 05/12/2019 10:37 AM

## 2019-05-12 NOTE — Progress Notes (Signed)
[facility] Cardiology    SUBJECTIVE: Doing reasonably well no chest pain has gotten transfusions for her anemia denies any frank blood no chest pain no lightheaded no dizziness no syncope no numbness no further evidence of TIA or CVA symptoms   Vitals:   05/11/19 1251 05/11/19 2044 05/12/19 0440 05/12/19 1148  BP: 131/75 125/68 (!) 123/58 129/74  Pulse: 86 92 81 86  Resp: 16 19 18    Temp: 98.6 F (37 C) 98.9 F (37.2 C) 98.4 F (36.9 C) 99 F (37.2 C)  TempSrc: Oral Oral Oral Oral  SpO2: 96% 94% 92% 95%  Weight:      Height:         Intake/Output Summary (Last 24 hours) at 05/12/2019 1714 Last data filed at 05/12/2019 1225 Gross per 24 hour  Intake 360 ml  Output 700 ml  Net -340 ml      PHYSICAL EXAM  General: Well developed, well nourished, in no acute distress HEENT:  Normocephalic and atramatic Neck:  No JVD.  Lungs: Clear bilaterally to auscultation and percussion. Heart: HRRR . Normal S1 and S2 without gallops or murmurs.  Abdomen: Bowel sounds are positive, abdomen soft and non-tender  Msk:  Back normal, normal gait. Normal strength and tone for age. Extremities: No clubbing, cyanosis or edema.   Neuro: Alert and oriented X 3. Psych:  Good affect, responds appropriately   LABS: Basic Metabolic Panel: Recent Labs    05/11/19 1219 05/12/19 0518  NA 139  --   K 3.4* 3.1*  CL 108  --   CO2 24  --   GLUCOSE 90  --   BUN 5*  --   CREATININE 0.54  --   CALCIUM 8.8*  --    Liver Function Tests: No results for input(s): AST, ALT, ALKPHOS, BILITOT, PROT, ALBUMIN in the last 72 hours. No results for input(s): LIPASE, AMYLASE in the last 72 hours. CBC: Recent Labs    05/11/19 1219 05/12/19 0518  WBC 12.0*  --   HGB 8.3* 7.7*  HCT 28.5* 26.2*  MCV 74.4*  --   PLT 239  --    Cardiac Enzymes: No results for input(s): CKTOTAL, CKMB, CKMBINDEX, TROPONINI in the last 72 hours. BNP: Invalid input(s): POCBNP D-Dimer: No results for input(s): DDIMER in the  last 72 hours. Hemoglobin A1C: No results for input(s): HGBA1C in the last 72 hours. Fasting Lipid Panel: No results for input(s): CHOL, HDL, LDLCALC, TRIG, CHOLHDL, LDLDIRECT in the last 72 hours. Thyroid Function Tests: No results for input(s): TSH, T4TOTAL, T3FREE, THYROIDAB in the last 72 hours.  Invalid input(s): FREET3 Anemia Panel: Recent Labs    05/09/19 1718  VITAMINB12 212    No results found.   Echo normal left ventricular function  TELEMETRY: Normal sinus rhythm nonspecific ST-T wave changes  ASSESSMENT AND PLAN:  Principal Problem:   GIB (gastrointestinal bleeding) Active Problems:   Hypertension   Right arm numbness   Symptomatic anemia   Tobacco abuse   Hypokalemia   Cerebrovascular accident (CVA) (HCC)    Plan Preoperative status for CEA on Friday Status post TIA would recommend long-term aspirin and Plavix post procedure Advised the patient to refrain from tobacco abuse Recommend diet and exercise Correct electrolytes prior to surgery Correct anemia has gotten iron may need transfusions Patient may need to be considered for GI evaluation because of anemia Recommend statin therapy long-term Agree with echocardiogram which shows normal left ventricular function   Tuesday, MD 05/12/2019 5:14  PM

## 2019-05-13 ENCOUNTER — Encounter
Admission: EM | Disposition: A | Discharge status: Home / Self Care | Payer: Self-pay | Source: Home / Self Care | Attending: Internal Medicine

## 2019-05-13 ENCOUNTER — Inpatient Hospital Stay: Payer: Self-pay | Admitting: Anesthesiology

## 2019-05-13 ENCOUNTER — Encounter: Payer: Self-pay | Admitting: Internal Medicine

## 2019-05-13 DIAGNOSIS — I6522 Occlusion and stenosis of left carotid artery: Secondary | ICD-10-CM

## 2019-05-13 HISTORY — PX: ENDARTERECTOMY: SHX5162

## 2019-05-13 LAB — APTT: aPTT: 41 seconds — ABNORMAL HIGH (ref 24–36)

## 2019-05-13 LAB — CBC
HCT: 27.7 % — ABNORMAL LOW (ref 36.0–46.0)
Hemoglobin: 8.3 g/dL — ABNORMAL LOW (ref 12.0–15.0)
MCH: 22.8 pg — ABNORMAL LOW (ref 26.0–34.0)
MCHC: 30 g/dL (ref 30.0–36.0)
MCV: 76.1 fL — ABNORMAL LOW (ref 80.0–100.0)
Platelets: 190 10*3/uL (ref 150–400)
RBC: 3.64 MIL/uL — ABNORMAL LOW (ref 3.87–5.11)
WBC: 10.4 10*3/uL (ref 4.0–10.5)
nRBC: 0.3 % — ABNORMAL HIGH (ref 0.0–0.2)

## 2019-05-13 LAB — BASIC METABOLIC PANEL
Anion gap: 7 (ref 5–15)
BUN: 6 mg/dL (ref 6–20)
CO2: 24 mmol/L (ref 22–32)
Calcium: 8.8 mg/dL — ABNORMAL LOW (ref 8.9–10.3)
Chloride: 109 mmol/L (ref 98–111)
Creatinine, Ser: 0.48 mg/dL (ref 0.44–1.00)
GFR calc Af Amer: >60 mL/min (ref >60)
GFR calc non Af Amer: >60 mL/min (ref >60)
Glucose, Bld: 98 mg/dL (ref 70–99)
Potassium: 3.4 mmol/L — ABNORMAL LOW (ref 3.5–5.1)
Sodium: 140 mmol/L (ref 135–145)

## 2019-05-13 LAB — GLUCOSE, CAPILLARY: Glucose-Capillary: 162 mg/dL — ABNORMAL HIGH (ref 70–99)

## 2019-05-13 LAB — MAGNESIUM: Magnesium: 1.8 mg/dL (ref 1.7–2.4)

## 2019-05-13 LAB — PROTIME-INR
INR: 1.1 (ref 0.8–1.2)
Prothrombin Time: 13.7 seconds (ref 11.4–15.2)

## 2019-05-13 LAB — MRSA PCR SCREENING: MRSA by PCR: NEGATIVE

## 2019-05-13 SURGERY — ENDARTERECTOMY, CAROTID
Anesthesia: General | Laterality: Left

## 2019-05-13 MED ORDER — OXYCODONE HCL 5 MG PO TABS
ORAL_TABLET | ORAL | Status: AC
  Administered 2019-05-13: 5 mg via ORAL
  Filled 2019-05-13: qty 1

## 2019-05-13 MED ORDER — LABETALOL HCL 5 MG/ML IV SOLN: 10.0000 mg | INTRAVENOUS | Status: DC | PRN

## 2019-05-13 MED ORDER — GLYCOPYRROLATE 0.2 MG/ML IJ SOLN
INTRAMUSCULAR | Status: DC | PRN
  Administered 2019-05-13: .2 mg via INTRAVENOUS

## 2019-05-13 MED ORDER — ACETAMINOPHEN 10 MG/ML IV SOLN
INTRAVENOUS | Status: DC | PRN
  Administered 2019-05-13: 1000 mg via INTRAVENOUS

## 2019-05-13 MED ORDER — CEFAZOLIN SODIUM-DEXTROSE 2-4 GM/100ML-% IV SOLN
2.0000 g | INTRAVENOUS | Status: AC
  Administered 2019-05-13: 08:00:00 2 g via INTRAVENOUS

## 2019-05-13 MED ORDER — LIDOCAINE HCL (CARDIAC) PF 100 MG/5ML IV SOSY
PREFILLED_SYRINGE | INTRAVENOUS | Status: DC | PRN
  Administered 2019-05-13: 100 mg via INTRAVENOUS

## 2019-05-13 MED ORDER — HEPARIN SODIUM (PORCINE) 1000 UNIT/ML IJ SOLN
INTRAMUSCULAR | Status: AC
  Filled 2019-05-13: qty 1

## 2019-05-13 MED ORDER — PROPOFOL 10 MG/ML IV BOLUS
INTRAVENOUS | Status: DC | PRN
  Administered 2019-05-13: 140 mg via INTRAVENOUS

## 2019-05-13 MED ORDER — CEFAZOLIN SODIUM-DEXTROSE 2-4 GM/100ML-% IV SOLN
2.0000 g | Freq: Three times a day (TID) | INTRAVENOUS | Status: AC
  Administered 2019-05-13 (×2): 2 g via INTRAVENOUS
  Filled 2019-05-13 (×2): qty 100

## 2019-05-13 MED ORDER — METOPROLOL TARTRATE 5 MG/5ML IV SOLN: 2.0000 mg | INTRAVENOUS | Status: DC | PRN

## 2019-05-13 MED ORDER — ESMOLOL HCL 100 MG/10ML IV SOLN
INTRAVENOUS | Status: DC | PRN
Start: 2019-05-13 — End: 2019-05-13
  Administered 2019-05-13: 20 mg via INTRAVENOUS
  Administered 2019-05-13: 30 mg via INTRAVENOUS
  Administered 2019-05-13: 20 mg via INTRAVENOUS
  Administered 2019-05-13: 10 mg via INTRAVENOUS
  Administered 2019-05-13: 50 mg via INTRAVENOUS

## 2019-05-13 MED ORDER — EPHEDRINE 5 MG/ML INJ
INTRAVENOUS | Status: AC
  Filled 2019-05-13: qty 10

## 2019-05-13 MED ORDER — CHLORHEXIDINE GLUCONATE CLOTH 2 % EX PADS: 6.0000 | MEDICATED_PAD | Freq: Once | CUTANEOUS | Status: DC

## 2019-05-13 MED ORDER — ONDANSETRON HCL 4 MG/2ML IJ SOLN
INTRAMUSCULAR | Status: AC
  Filled 2019-05-13: qty 6

## 2019-05-13 MED ORDER — HEPARIN SODIUM (PORCINE) 5000 UNIT/ML IJ SOLN
INTRAMUSCULAR | Status: AC
  Filled 2019-05-13: qty 1

## 2019-05-13 MED ORDER — GUAIFENESIN-DM 100-10 MG/5ML PO SYRP: 15.0000 mL | ORAL_SOLUTION | ORAL | Status: DC | PRN

## 2019-05-13 MED ORDER — ASPIRIN EC 81 MG PO TBEC
81.0000 mg | DELAYED_RELEASE_TABLET | Freq: Every day | ORAL | Status: DC
  Administered 2019-05-14 – 2019-05-15 (×2): 81 mg via ORAL
  Filled 2019-05-13 (×2): qty 1

## 2019-05-13 MED ORDER — SODIUM CHLORIDE FLUSH 0.9 % IV SOLN
INTRAVENOUS | Status: AC
  Filled 2019-05-13: qty 10

## 2019-05-13 MED ORDER — SODIUM CHLORIDE 0.9 % IV SOLN: INTRAVENOUS | Status: AC

## 2019-05-13 MED ORDER — SUGAMMADEX SODIUM 500 MG/5ML IV SOLN
INTRAVENOUS | Status: AC
  Filled 2019-05-13: qty 5

## 2019-05-13 MED ORDER — FENTANYL CITRATE (PF) 100 MCG/2ML IJ SOLN
INTRAMUSCULAR | Status: DC | PRN
  Administered 2019-05-13 (×3): 50 ug via INTRAVENOUS

## 2019-05-13 MED ORDER — ASPIRIN EC 81 MG PO TBEC: 81.0000 mg | DELAYED_RELEASE_TABLET | Freq: Every day | ORAL | Status: DC

## 2019-05-13 MED ORDER — FIBRIN SEALANT 2 ML SINGLE DOSE KIT
PACK | CUTANEOUS | Status: DC | PRN
  Administered 2019-05-13: 2 mL via TOPICAL

## 2019-05-13 MED ORDER — GLYCOPYRROLATE 0.2 MG/ML IJ SOLN
INTRAMUSCULAR | Status: AC
  Filled 2019-05-13: qty 2

## 2019-05-13 MED ORDER — ACETAMINOPHEN 325 MG PO TABS
325.0000 mg | ORAL_TABLET | ORAL | Status: DC | PRN
  Administered 2019-05-13 – 2019-05-15 (×3): 650 mg via ORAL
  Filled 2019-05-13 (×3): qty 2

## 2019-05-13 MED ORDER — ONDANSETRON HCL 4 MG/2ML IJ SOLN: 4.0000 mg | Freq: Once | INTRAMUSCULAR | Status: DC | PRN

## 2019-05-13 MED ORDER — PANTOPRAZOLE SODIUM 40 MG IV SOLR
40.0000 mg | Freq: Every day | INTRAVENOUS | Status: DC
  Administered 2019-05-13 – 2019-05-14 (×2): 40 mg via INTRAVENOUS
  Filled 2019-05-13 (×2): qty 40

## 2019-05-13 MED ORDER — PHENYLEPHRINE HCL (PRESSORS) 10 MG/ML IV SOLN
INTRAVENOUS | Status: AC
  Filled 2019-05-13: qty 1

## 2019-05-13 MED ORDER — LACTATED RINGERS IV BOLUS
1000.0000 mL | Freq: Once | INTRAVENOUS | Status: AC
  Administered 2019-05-13: 1000 mL via INTRAVENOUS

## 2019-05-13 MED ORDER — ONDANSETRON HCL 4 MG/2ML IJ SOLN
INTRAMUSCULAR | Status: DC | PRN
  Administered 2019-05-13 (×2): 4 mg via INTRAVENOUS

## 2019-05-13 MED ORDER — KCL IN DEXTROSE-NACL 20-5-0.9 MEQ/L-%-% IV SOLN
INTRAVENOUS | Status: DC
  Filled 2019-05-13 (×2): qty 1000

## 2019-05-13 MED ORDER — MORPHINE SULFATE (PF) 2 MG/ML IV SOLN
2.0000 mg | INTRAVENOUS | Status: DC | PRN
  Administered 2019-05-13: 2 mg via INTRAVENOUS
  Filled 2019-05-13: qty 1

## 2019-05-13 MED ORDER — ROCURONIUM BROMIDE 10 MG/ML (PF) SYRINGE
PREFILLED_SYRINGE | INTRAVENOUS | Status: AC
  Filled 2019-05-13: qty 10

## 2019-05-13 MED ORDER — SUCCINYLCHOLINE CHLORIDE 200 MG/10ML IV SOSY
PREFILLED_SYRINGE | INTRAVENOUS | Status: AC
  Filled 2019-05-13: qty 10

## 2019-05-13 MED ORDER — DEXAMETHASONE SODIUM PHOSPHATE 10 MG/ML IJ SOLN
INTRAMUSCULAR | Status: DC | PRN
  Administered 2019-05-13: 10 mg via INTRAVENOUS

## 2019-05-13 MED ORDER — LABETALOL HCL 5 MG/ML IV SOLN
INTRAVENOUS | Status: DC | PRN
  Administered 2019-05-13: 5 mg via INTRAVENOUS
  Administered 2019-05-13: 10 mg via INTRAVENOUS

## 2019-05-13 MED ORDER — ACETAMINOPHEN 650 MG RE SUPP: 325.0000 mg | RECTAL | Status: DC | PRN

## 2019-05-13 MED ORDER — LIDOCAINE HCL (PF) 1 % IJ SOLN
INTRAMUSCULAR | Status: AC
  Filled 2019-05-13: qty 30

## 2019-05-13 MED ORDER — MIDAZOLAM HCL 2 MG/2ML IJ SOLN
INTRAMUSCULAR | Status: AC
  Filled 2019-05-13: qty 2

## 2019-05-13 MED ORDER — SODIUM CHLORIDE 0.9 % IV SOLN: 500.0000 mL | Freq: Once | INTRAVENOUS | Status: DC | PRN

## 2019-05-13 MED ORDER — OXYCODONE HCL 5 MG PO TABS: 5.0000 mg | ORAL_TABLET | Freq: Once | ORAL | Status: AC

## 2019-05-13 MED ORDER — NITROGLYCERIN IN D5W 200-5 MCG/ML-% IV SOLN
INTRAVENOUS | Status: AC
  Filled 2019-05-13: qty 250

## 2019-05-13 MED ORDER — ACETAMINOPHEN 10 MG/ML IV SOLN
INTRAVENOUS | Status: AC
  Filled 2019-05-13: qty 100

## 2019-05-13 MED ORDER — ALUM & MAG HYDROXIDE-SIMETH 200-200-20 MG/5ML PO SUSP: 15.0000 mL | ORAL | Status: DC | PRN

## 2019-05-13 MED ORDER — CHLORHEXIDINE GLUCONATE CLOTH 2 % EX PADS
6.0000 | MEDICATED_PAD | Freq: Every day | CUTANEOUS | Status: DC
  Administered 2019-05-14 – 2019-05-15 (×2): 6 via TOPICAL

## 2019-05-13 MED ORDER — HEPARIN SODIUM (PORCINE) 1000 UNIT/ML IJ SOLN
INTRAMUSCULAR | Status: DC | PRN
Start: 2019-05-13 — End: 2019-05-13
  Administered 2019-05-13: 7000 [IU] via INTRAVENOUS

## 2019-05-13 MED ORDER — FENTANYL CITRATE (PF) 100 MCG/2ML IJ SOLN
INTRAMUSCULAR | Status: AC
  Filled 2019-05-13: qty 2

## 2019-05-13 MED ORDER — ESMOLOL HCL 100 MG/10ML IV SOLN
INTRAVENOUS | Status: AC
  Filled 2019-05-13: qty 20

## 2019-05-13 MED ORDER — CEFAZOLIN SODIUM-DEXTROSE 2-4 GM/100ML-% IV SOLN
INTRAVENOUS | Status: AC
  Filled 2019-05-13: qty 100

## 2019-05-13 MED ORDER — ORAL CARE MOUTH RINSE
15.0000 mL | Freq: Two times a day (BID) | OROMUCOSAL | Status: DC
  Administered 2019-05-15: 15 mL via OROMUCOSAL

## 2019-05-13 MED ORDER — OXYCODONE HCL 5 MG PO TABS
5.0000 mg | ORAL_TABLET | Freq: Four times a day (QID) | ORAL | Status: DC | PRN
  Administered 2019-05-13 – 2019-05-15 (×4): 5 mg via ORAL
  Filled 2019-05-13 (×4): qty 1

## 2019-05-13 MED ORDER — ONDANSETRON HCL 4 MG/2ML IJ SOLN: 4.0000 mg | Freq: Four times a day (QID) | INTRAMUSCULAR | Status: DC | PRN

## 2019-05-13 MED ORDER — PROPOFOL 10 MG/ML IV BOLUS
INTRAVENOUS | Status: AC
  Filled 2019-05-13: qty 40

## 2019-05-13 MED ORDER — HYDRALAZINE HCL 20 MG/ML IJ SOLN: 5.0000 mg | INTRAMUSCULAR | Status: DC | PRN

## 2019-05-13 MED ORDER — EPHEDRINE SULFATE 50 MG/ML IJ SOLN
INTRAMUSCULAR | Status: DC | PRN
  Administered 2019-05-13 (×2): 5 mg via INTRAVENOUS

## 2019-05-13 MED ORDER — LACTATED RINGERS IV SOLN: INTRAVENOUS | Status: DC | PRN

## 2019-05-13 MED ORDER — DEXAMETHASONE SODIUM PHOSPHATE 10 MG/ML IJ SOLN
INTRAMUSCULAR | Status: AC
  Filled 2019-05-13: qty 2

## 2019-05-13 MED ORDER — POTASSIUM CHLORIDE CRYS ER 20 MEQ PO TBCR
40.0000 meq | EXTENDED_RELEASE_TABLET | Freq: Once | ORAL | Status: AC
  Administered 2019-05-13: 40 meq via ORAL

## 2019-05-13 MED ORDER — DOCUSATE SODIUM 100 MG PO CAPS
100.0000 mg | ORAL_CAPSULE | Freq: Every day | ORAL | Status: DC
  Administered 2019-05-14 – 2019-05-15 (×2): 100 mg via ORAL
  Filled 2019-05-13 (×2): qty 1

## 2019-05-13 MED ORDER — PHENOL 1.4 % MT LIQD
1.0000 | OROMUCOSAL | Status: DC | PRN
  Administered 2019-05-14: 1 via OROMUCOSAL
  Filled 2019-05-13: qty 177

## 2019-05-13 MED ORDER — ROCURONIUM BROMIDE 100 MG/10ML IV SOLN
INTRAVENOUS | Status: DC | PRN
  Administered 2019-05-13 (×2): 20 mg via INTRAVENOUS
  Administered 2019-05-13: 50 mg via INTRAVENOUS

## 2019-05-13 MED ORDER — FENTANYL CITRATE (PF) 100 MCG/2ML IJ SOLN
25.0000 ug | INTRAMUSCULAR | Status: DC | PRN
  Administered 2019-05-13: 25 ug via INTRAVENOUS

## 2019-05-13 MED ORDER — SODIUM CHLORIDE 0.9 % IV SOLN
INTRAVENOUS | Status: DC | PRN
  Administered 2019-05-13: 10 ug/min via INTRAVENOUS

## 2019-05-13 MED ORDER — LIDOCAINE HCL (PF) 2 % IJ SOLN
INTRAMUSCULAR | Status: AC
  Filled 2019-05-13: qty 5

## 2019-05-13 SURGICAL SUPPLY — 71 items
"PENCIL ELECTRO HAND CTR " (MISCELLANEOUS) IMPLANT
APPLIER CLIP 11 MED OPEN (CLIP)
APPLIER CLIP 9.375 SM OPEN (CLIP)
BAG DECANTER FOR FLEXI CONT (MISCELLANEOUS) ×3 IMPLANT
BLADE SURG 15 STRL LF DISP TIS (BLADE) ×1 IMPLANT
BLADE SURG 15 STRL SS (BLADE) ×2
BLADE SURG SZ11 CARB STEEL (BLADE) ×3 IMPLANT
BOOT SUTURE AID YELLOW STND (SUTURE) ×6 IMPLANT
BRUSH SCRUB EZ  4% CHG (MISCELLANEOUS) ×2
BRUSH SCRUB EZ 4% CHG (MISCELLANEOUS) ×1 IMPLANT
CANISTER SUCT 1200ML W/VALVE (MISCELLANEOUS) ×3 IMPLANT
CHLORAPREP W/TINT 26 (MISCELLANEOUS) ×3 IMPLANT
CLIP APPLIE 11 MED OPEN (CLIP) IMPLANT
CLIP APPLIE 9.375 SM OPEN (CLIP) IMPLANT
COVER WAND RF STERILE (DRAPES) ×3 IMPLANT
DERMABOND ADVANCED (GAUZE/BANDAGES/DRESSINGS) ×2
DERMABOND ADVANCED .7 DNX12 (GAUZE/BANDAGES/DRESSINGS) ×1 IMPLANT
DRAPE 3/4 80X56 (DRAPES) ×1 IMPLANT
DRAPE INCISE IOBAN 66X45 STRL (DRAPES) ×3 IMPLANT
DRAPE LAPAROTOMY 100X77 ABD (DRAPES) ×3 IMPLANT
DRESSING SURGICEL FIBRLLR 1X2 (HEMOSTASIS) ×1 IMPLANT
DRSG SURGICEL FIBRILLAR 1X2 (HEMOSTASIS) ×3
ELECT CAUTERY BLADE 6.4 (BLADE) ×3 IMPLANT
ELECT REM PT RETURN 9FT ADLT (ELECTROSURGICAL) ×3
ELECTRODE REM PT RTRN 9FT ADLT (ELECTROSURGICAL) ×1 IMPLANT
GLOVE BIO SURGEON STRL SZ7 (GLOVE) ×7 IMPLANT
GLOVE INDICATOR 7.5 STRL GRN (GLOVE) ×7 IMPLANT
GLOVE SURG SYN 8.0 (GLOVE) ×6 IMPLANT
GLOVE SURG SYN 8.0 PF PI (GLOVE) ×1 IMPLANT
GOWN STRL REUS W/ TWL LRG LVL3 (GOWN DISPOSABLE) ×2 IMPLANT
GOWN STRL REUS W/ TWL XL LVL3 (GOWN DISPOSABLE) ×1 IMPLANT
GOWN STRL REUS W/TWL LRG LVL3 (GOWN DISPOSABLE) ×4
GOWN STRL REUS W/TWL XL LVL3 (GOWN DISPOSABLE) ×2
HOLDER FOLEY CATH W/STRAP (MISCELLANEOUS) ×3 IMPLANT
IV NS 500ML (IV SOLUTION) ×2
IV NS 500ML BAXH (IV SOLUTION) ×1 IMPLANT
KIT TURNOVER KIT A (KITS) ×3 IMPLANT
LABEL OR SOLS (LABEL) ×3 IMPLANT
LOOP RED MAXI  1X406MM (MISCELLANEOUS) ×4
LOOP VESSEL MAXI 1X406 RED (MISCELLANEOUS) ×2 IMPLANT
LOOP VESSEL MINI 0.8X406 BLUE (MISCELLANEOUS) ×1 IMPLANT
LOOPS BLUE MINI 0.8X406MM (MISCELLANEOUS) ×2
NDL FILTER BLUNT 18X1 1/2 (NEEDLE) ×1 IMPLANT
NDL HYPO 25X1 1.5 SAFETY (NEEDLE) ×1 IMPLANT
NEEDLE FILTER BLUNT 18X 1/2SAF (NEEDLE) ×2
NEEDLE FILTER BLUNT 18X1 1/2 (NEEDLE) ×1 IMPLANT
NEEDLE HYPO 25X1 1.5 SAFETY (NEEDLE) ×3 IMPLANT
NS IRRIG 500ML POUR BTL (IV SOLUTION) ×3 IMPLANT
PACK BASIN MAJOR (MISCELLANEOUS) ×3 IMPLANT
PATCH CAROTID ECM VASC 1X10 (Prosthesis & Implant Heart) ×2 IMPLANT
PENCIL ELECTRO HAND CTR (MISCELLANEOUS) IMPLANT
SHUNT CAROTID STR REINF 3.0X4. (MISCELLANEOUS) ×3 IMPLANT
SUT MNCRL+ 5-0 UNDYED PC-3 (SUTURE) ×1 IMPLANT
SUT MONOCRYL 5-0 (SUTURE) ×2
SUT PROLENE 6 0 BV (SUTURE) ×24 IMPLANT
SUT PROLENE 7 0 BV 1 (SUTURE) ×18 IMPLANT
SUT SILK 2 0 (SUTURE) ×2
SUT SILK 2-0 18XBRD TIE 12 (SUTURE) ×1 IMPLANT
SUT SILK 3 0 (SUTURE) ×2
SUT SILK 3-0 18XBRD TIE 12 (SUTURE) ×1 IMPLANT
SUT SILK 4 0 (SUTURE) ×2
SUT SILK 4-0 18XBRD TIE 12 (SUTURE) ×1 IMPLANT
SUT VIC AB 3-0 SH 27 (SUTURE) ×2
SUT VIC AB 3-0 SH 27X BRD (SUTURE) ×1 IMPLANT
SYR 10ML LL (SYRINGE) ×3 IMPLANT
SYR 20ML LL LF (SYRINGE) ×3 IMPLANT
SYR 3ML LL SCALE MARK (SYRINGE) ×3 IMPLANT
TAPE UMBILICAL 1/8X18 COTTON (MISCELLANEOUS) ×2 IMPLANT
TRAY FOLEY MTR SLVR 16FR STAT (SET/KITS/TRAYS/PACK) ×3 IMPLANT
TUBING CONNECTING 10 (TUBING) IMPLANT
TUBING CONNECTING 10' (TUBING)

## 2019-05-13 NOTE — Anesthesia Procedure Notes (Signed)
Procedure Name: Intubation Performed by: Fletcher-Harrison, Shirelle Tootle, CRNA Pre-anesthesia Checklist: Patient identified, Emergency Drugs available, Suction available and Patient being monitored Patient Re-evaluated:Patient Re-evaluated prior to induction Oxygen Delivery Method: Circle system utilized Preoxygenation: Pre-oxygenation with 100% oxygen Induction Type: IV induction Ventilation: Mask ventilation without difficulty Laryngoscope Size: McGraph and 3 Grade View: Grade I Tube type: Oral Tube size: 7.0 mm Number of attempts: 1 Airway Equipment and Method: Stylet Placement Confirmation: ETT inserted through vocal cords under direct vision,  positive ETCO2,  CO2 detector and breath sounds checked- equal and bilateral Secured at: 21 cm Tube secured with: Tape Dental Injury: Teeth and Oropharynx as per pre-operative assessment        

## 2019-05-13 NOTE — Anesthesia Preprocedure Evaluation (Signed)
Anesthesia Evaluation  Patient identified by MRN, date of birth, ID band Patient awake    Reviewed: Allergy & Precautions, NPO status , Patient's Chart, lab work & pertinent test results  History of Anesthesia Complications Negative for: history of anesthetic complications  Airway Mallampati: III  TM Distance: >3 FB Neck ROM: Full    Dental no notable dental hx.    Pulmonary neg sleep apnea, neg COPD, Current Smoker and Patient abstained from smoking.,           Cardiovascular hypertension, Pt. on medications (-) CAD, (-) Past MI, (-) Cardiac Stents and (-) CABG   Echo 05/09/19: 1. Left ventricular ejection fraction, by estimation, is 55 to 60%. The  left ventricle has normal function. The left ventricle has no regional  wall motion abnormalities. There is mild left ventricular hypertrophy.  Left ventricular diastolic parameters  are consistent with Grade I diastolic dysfunction (impaired relaxation).  2. Right ventricular systolic function is normal. The right ventricular  size is normal. There is normal pulmonary artery systolic pressure.  3. Left atrial size was moderately dilated.  4. Right atrial size was mildly dilated.  5. The mitral valve is normal in structure. Mild mitral valve  regurgitation. No evidence of mitral stenosis.  6. The aortic valve is tricuspid. Aortic valve regurgitation is trivial.  No aortic stenosis is present.  7. The inferior vena cava is normal in size with greater than 50%  respiratory variability, suggesting right atrial pressure of 3 mmHg.  8. Agitated saline contrast bubble study was negative, with no evidence  of any interatrial shunt.    Neuro/Psych neg Seizures TIACVA, No Residual Symptoms negative psych ROS   GI/Hepatic negative GI ROS, Neg liver ROS,   Endo/Other  negative endocrine ROSneg diabetes  Renal/GU negative Renal ROS     Musculoskeletal  (+) Arthritis ,   Abdominal (+) + obese,   Peds  Hematology  (+) anemia ,   Anesthesia Other Findings   Reproductive/Obstetrics                             Anesthesia Physical Anesthesia Plan  ASA: III  Anesthesia Plan: General   Post-op Pain Management:    Induction: Intravenous  PONV Risk Score and Plan: 1 and Ondansetron and Dexamethasone  Airway Management Planned: Oral ETT  Additional Equipment: Arterial line  Intra-op Plan:   Post-operative Plan: Extubation in OR  Informed Consent: I have reviewed the patients History and Physical, chart, labs and discussed the procedure including the risks, benefits and alternatives for the proposed anesthesia with the patient or authorized representative who has indicated his/her understanding and acceptance.     Dental advisory given  Plan Discussed with: CRNA and Anesthesiologist  Anesthesia Plan Comments:         Anesthesia Quick Evaluation

## 2019-05-13 NOTE — Anesthesia Procedure Notes (Signed)
Arterial Line Insertion Start/End4/30/2021 7:55 AM, 05/13/2019 8:02 AM Performed by: Alver Fisher, MD, anesthesiologist  Patient location: Pre-op. Preanesthetic checklist: patient identified, IV checked, site marked, risks and benefits discussed, surgical consent, monitors and equipment checked, pre-op evaluation, timeout performed and anesthesia consent radial was placed Catheter size: 20 Fr Hand hygiene performed , maximum sterile barriers used  and Seldinger technique used  Attempts: 2 Procedure performed using ultrasound guided technique. Ultrasound Notes:anatomy identified, needle tip was noted to be adjacent to the nerve/plexus identified and no ultrasound evidence of intravascular and/or intraneural injection Following insertion, dressing applied and Biopatch. Post procedure assessment: normal and unchanged  Patient tolerated the procedure well with no immediate complications.

## 2019-05-13 NOTE — Progress Notes (Signed)
Pt has tingling to toes bilaterally. Strong dorsiflexion and plantar flexion bilaterally. Strong grips bilaterally. L side of face and tongue numbness. Face is symmetrical. PA made aware. VSS. 2 L placed for oxygen drops to 88% with sleep after pain medications. Pedal and radial pulses 2+ bilaterally. Will continue to monitor.

## 2019-05-13 NOTE — Progress Notes (Signed)
PROGRESS NOTE    Kimberly Mcdonald  MPN:[id number] DOB: July 05, 1965 DOA: 05/08/2019 PCP: Patient, No Pcp Per    Brief Narrative:   Kimberly Mcdonald is a 54 y.o. female with medical history significant of hypertension, substance abuse, tobacco abuse, who presents with dark stool on the right arm numbness.    Consultants:   GI, neurology, vascular surgery, cardiology  Procedures:  Antimicrobials:       Subjective: Status post left CEA.  Complaining of posterior head mild headache.  No other issues.  Objective: Vitals:   05/13/19 1400 05/13/19 1457 05/13/19 1500 05/13/19 1600  BP: 130/62  132/71 135/70  Pulse: 69  69 77  Resp: 18  18 19   Temp:      TempSrc:      SpO2: (!) 88% 95% (!) 86% 96%  Weight:      Height:        Intake/Output Summary (Last 24 hours) at 05/13/2019 1647 Last data filed at 05/13/2019 1600 Gross per 24 hour  Intake 1950.94 ml  Output 10 ml  Net 1940.94 ml   Filed Weights   05/08/19 1204 05/13/19 0656  Weight: 90.7 kg 99.8 kg    Examination:  General exam: Appears calm and comfortable , Respiratory system: Clear to auscultation. Respiratory effort normal. Neck: Fresh wound from Lt CEA Cardiovascular system: S1 & S2 heard, RRR. No JVD, murmurs, rubs, gallops or clicks.  Gastrointestinal system: Abdomen is nondistended, soft and nontender. Normal bowel sounds heard. Central nervous system: Alert and oriented. Grossly intact Extremities: no edema Skin: Warm and dry Psychiatry: Mood & affect appropriate in current setting.     Data Reviewed: I have personally reviewed following labs and imaging studies  CBC: Recent Labs  Lab 05/08/19 1215 05/08/19 1427 05/09/19 0352 05/09/19 0352 05/09/19 0904 05/09/19 1345 05/11/19 1219 05/12/19 0518 05/13/19 0442  WBC 9.1   < > 7.1  --  7.1 7.3 12.0*  --  10.4  NEUTROABS 6.6  --   --   --   --   --   --   --   --   HGB 4.0*   < > 6.1*   < > 7.4* 7.7* 8.3* 7.7* 8.3*  HCT 16.0*   < >  21.3*   < > 24.4* 24.9* 28.5* 26.2* 27.7*  MCV 62.7*   < > 72.2*  --  72.0* 71.8* 74.4*  --  76.1*  PLT 428*   < > 251  --  274 275 239  --  190   < > = values in this interval not displayed.   Basic Metabolic Panel: Recent Labs  Lab 05/08/19 1215 05/09/19 0352 05/11/19 1219 05/12/19 0518 05/13/19 0442  NA 140  --  139  --  140  K 3.2*  --  3.4* 3.1* 3.4*  CL 108  --  108  --  109  CO2 25  --  24  --  24  GLUCOSE 109*  --  90  --  98  BUN 8  --  5*  --  6  CREATININE 0.55  --  0.54  --  0.48  CALCIUM 9.1  --  8.8*  --  8.8*  MG  --  2.0  --   --  1.8   GFR: Estimated Creatinine Clearance: 89.6 mL/min (by C-G formula based on SCr of 0.48 mg/dL). Liver Function Tests: Recent Labs  Lab 05/08/19 1215  AST 11*  ALT 6  ALKPHOS 93  BILITOT  0.8  PROT 7.8  ALBUMIN 3.6   No results for input(s): LIPASE, AMYLASE in the last 168 hours. No results for input(s): AMMONIA in the last 168 hours. Coagulation Profile: Recent Labs  Lab 05/08/19 1215 05/13/19 0442  INR 1.0 1.1   Cardiac Enzymes: No results for input(s): CKTOTAL, CKMB, CKMBINDEX, TROPONINI in the last 168 hours. BNP (last 3 results) No results for input(s): PROBNP in the last 8760 hours. HbA1C: No results for input(s): HGBA1C in the last 72 hours. CBG: Recent Labs  Lab 05/13/19 1326  GLUCAP 162*   Lipid Profile: No results for input(s): CHOL, HDL, LDLCALC, TRIG, CHOLHDL, LDLDIRECT in the last 72 hours. Thyroid Function Tests: No results for input(s): TSH, T4TOTAL, FREET4, T3FREE, THYROIDAB in the last 72 hours. Anemia Panel: No results for input(s): VITAMINB12, FOLATE, FERRITIN, TIBC, IRON, RETICCTPCT in the last 72 hours. Sepsis Labs: No results for input(s): PROCALCITON, LATICACIDVEN in the last 168 hours.  Recent Results (from the past 240 hour(s))  Respiratory Panel by RT PCR (Flu A&B, Covid) - Nasopharyngeal Swab     Status: None   Collection Time: 05/08/19  1:22 PM   Specimen: Nasopharyngeal Swab    Result Value Ref Range Status   SARS Coronavirus 2 by RT PCR NEGATIVE NEGATIVE Final    Comment: (NOTE) SARS-CoV-2 target nucleic acids are NOT DETECTED. The SARS-CoV-2 RNA is generally detectable in upper respiratoy specimens during the acute phase of infection. The lowest concentration of SARS-CoV-2 viral copies this assay can detect is 131 copies/mL. A negative result does not preclude SARS-Cov-2 infection and should not be used as the sole basis for treatment or other patient management decisions. A negative result may occur with  improper specimen collection/handling, submission of specimen other than nasopharyngeal swab, presence of viral mutation(s) within the areas targeted by this assay, and inadequate number of viral copies (<131 copies/mL). A negative result must be combined with clinical observations, patient history, and epidemiological information. The expected result is Negative. Fact Sheet for Patients:  https://www.moore.com/ Fact Sheet for Healthcare Providers:  https://www.young.biz/ This test is not yet ap proved or cleared by the [place] FDA and  has been authorized for detection and/or diagnosis of SARS-CoV-2 by FDA under an Emergency Use Authorization (EUA). This EUA will remain  in effect (meaning this test can be used) for the duration of the COVID-19 declaration under Section 564(b)(1) of the Act, 21 U.S.C. section 360bbb-3(b)(1), unless the authorization is terminated or revoked sooner.    Influenza A by PCR NEGATIVE NEGATIVE Final   Influenza B by PCR NEGATIVE NEGATIVE Final    Comment: (NOTE) The Xpert Xpress SARS-CoV-2/FLU/RSV assay is intended as an aid in  the diagnosis of influenza from Nasopharyngeal swab specimens and  should not be used as a sole basis for treatment. Nasal washings and  aspirates are unacceptable for Xpert Xpress SARS-CoV-2/FLU/RSV  testing. Fact Sheet for  Patients: https://www.moore.com/ Fact Sheet for Healthcare Providers: https://www.young.biz/ This test is not yet approved or cleared by the [place] FDA and  has been authorized for detection and/or diagnosis of SARS-CoV-2 by  FDA under an Emergency Use Authorization (EUA). This EUA will remain  in effect (meaning this test can be used) for the duration of the  Covid-19 declaration under Section 564(b)(1) of the Act, 21  U.S.C. section 360bbb-3(b)(1), unless the authorization is  terminated or revoked. Performed at [facility], [address], [place], [place] [address]   MRSA PCR Screening  Status: None   Collection Time: 05/13/19 12:54 PM   Specimen: Nasal Mucosa; Nasopharyngeal  Result Value Ref Range Status   MRSA by PCR NEGATIVE NEGATIVE Final    Comment:        The GeneXpert MRSA Assay (FDA approved for NASAL specimens only), is one component of a comprehensive MRSA colonization surveillance program. It is not intended to diagnose MRSA infection nor to guide or monitor treatment for MRSA infections. Performed at [facility], [address], [place], [place] [address]          Radiology Studies: No results found.      Scheduled Meds: . amLODipine  10 mg Oral Daily  . [START ON 05/14/2019] aspirin EC  81 mg Oral Q0600  . atorvastatin  40 mg Oral Daily  . Chlorhexidine Gluconate Cloth  6 each Topical Daily  . [START ON 05/14/2019] docusate sodium  100 mg Oral Daily  . fentaNYL      . ferrous sulfate  325 mg Oral BID WC  . mouth rinse  15 mL Mouth Rinse BID  . nicotine  21 mg Transdermal Daily  . pantoprazole (PROTONIX) IV  40 mg Intravenous QHS  . sodium chloride flush      . vitamin B-12  500 mcg Oral Daily   Continuous Infusions: . sodium chloride    . sodium chloride Stopped (05/13/19 1558)  .  ceFAZolin (ANCEF) IV 2 g (05/13/19 1626)  . iron sucrose 440 mL/hr at 05/13/19 1600     Assessment & Plan:   Principal Problem:   GIB (gastrointestinal bleeding) Active Problems:   Hypertension   Right arm numbness   Symptomatic anemia   Tobacco abuse   Hypokalemia   Cerebrovascular accident (CVA) (HCC)   GIB (gastrointestinal bleeding)and anemia due to blood loss: -s/p 4Units PRBC H/h stable -Dr. Tobi Bastos of GI is consulted-- In the setting of new stroke G.I. plans to hold off on any luminal evaluation for few weeks -received  oral and IV iron -vitamin B12 orally -PPI bid changed to PO from IV -patient reports on of NSAIDs not as much as she used to take before   Anemia: iron deficiency with B12 -started on oral and IV iron -oral B12 -patient will see Dr. Smith Robert as outpatient.  Hypertension: controlled -IVhydralazine as needed -on oral amlodipine Better controlled   Acute left MCA CVA -pt came in with Right arm numbness. Risk factor smoking, marijuana use, hypertension untreated - CT of head showed possible lacunar stroke -MRI brain Cluster of small acute white matter and cortical infarcts in the left MCA distribution. 2. 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 Thad Ranger. Given G.I. bleeding no antiplatelet agent at present.  Left ICA stenosis 70-99% Cardiology cleared pt for CEA as mild to mod risk. 05/13/19: s/p Lt CEA  Tobacco and chronic marijuana abuse (>30 years) -Did counseling about importance of quitting smoking -Nicotine patch -urine drug screen positive for marijuana -pt advised on abstaining from marijuana  Hypokalemia: repleted today - Mg level 2.0    DVT prophylaxis: SCD Code Status: Full Family Communication: None at bedside Disposition Plan: Patient is undergoing left carotid endarterectomy Barrier:s/p Lt CEA today. Will be here 1-2 more days until cleared by surgery.       LOS: 5 days   Time spent: 45 minutes with more than 50% COC    Lynn Ito, MD [facility] Pager [phone]  If 7PM-7AM,  please contact night-coverage [email or web address] Password [facility] 05/13/2019, 4:47 PM Patient ID: Kimberly Mcdonald, female   DOB: Apr 23, 1965, 54 y.o.   MRN: [id number]

## 2019-05-13 NOTE — H&P (Signed)
[facility] History & Physical Update  The patient was interviewed and re-examined.  The patient's previous History and Physical has been reviewed and is unchanged.  There is no change in the plan of care. We plan to proceed with the scheduled procedure.  Levora Dredge, MD  05/13/2019, 7:22 AM

## 2019-05-13 NOTE — OR Nursing (Signed)
Bilateral hand grips are strong and equal. Bilateral foot movements are strong and equal.

## 2019-05-13 NOTE — OR Nursing (Signed)
Patient to room 10 in sds for preoperative questionnaire.  Patient with c/o pain and soreness to bilateral upper extremities.  Warmth /pain noted with palpation of area to both sides; patient very guarded with movement - worse on the left side.  Patient states IVs were previously removed from these areas d/t the pain with infusion.

## 2019-05-13 NOTE — Transfer of Care (Signed)
0.Immediate Anesthesia Transfer of Care Note  Patient: Kimberly Mcdonald  Procedure(s) Performed: ENDARTERECTOMY CAROTID (Left )  Patient Location: PACU  Anesthesia Type:General  Level of Consciousness: awake, drowsy and patient cooperative  Airway & Oxygen Therapy: Patient Spontanous Breathing and Patient connected to nasal cannula oxygen  Post-op Assessment: Report given to RN and Post -op Vital signs reviewed and stable  Post vital signs: Reviewed and stable  Last Vitals:  Vitals Value Taken Time  BP    Temp    Pulse 75 05/13/19 1032  Resp 10 05/13/19 1032  SpO2 91 % 05/13/19 1032  Vitals shown include unvalidated device data.  Last Pain:  Vitals:   05/13/19 0656  TempSrc: Temporal  PainSc: 0-No pain         Complications: No apparent anesthesia complications

## 2019-05-13 NOTE — Anesthesia Postprocedure Evaluation (Signed)
Anesthesia Post Note  Patient: Kimberly Mcdonald  Procedure(s) Performed: ENDARTERECTOMY CAROTID (Left )  Patient location during evaluation: PACU Anesthesia Type: General Level of consciousness: awake and alert and oriented Pain management: pain level controlled Vital Signs Assessment: post-procedure vital signs reviewed and stable Respiratory status: spontaneous breathing, nonlabored ventilation and respiratory function stable Cardiovascular status: blood pressure returned to baseline and stable Postop Assessment: no signs of nausea or vomiting Anesthetic complications: no     Last Vitals:  Vitals:   05/13/19 1100 05/13/19 1102  BP: 124/69   Pulse: 73 70  Resp:    Temp:    SpO2: 95% 97%    Last Pain:  Vitals:   05/13/19 1100  TempSrc:   PainSc: 5                  Veora Fonte

## 2019-05-13 NOTE — Op Note (Signed)
[facility]   OPERATIVE NOTE  PROCEDURE:   1.  Left carotid endarterectomy with CorMatrix arterial patch reconstruction  PRE-OPERATIVE DIAGNOSIS: 1.  Critical carotid stenosis 2.  Acute CVA with total resolution of her neurological deficits  POST-OPERATIVE DIAGNOSIS: same as above   SURGEON: Katha Cabal, MD  ASSISTANT(S): Ms. Hezzie Bump  ANESTHESIA: general  ESTIMATED BLOOD LOSS: 50 cc  FINDING(S): 1.  Extensive calcified carotid plaque.  SPECIMEN(S):  Carotid plaque (sent to Pathology)  INDICATIONS:   Kimberly Mcdonald is a 54 y.o. y.o. female who presents with left carotid stenosis of 80%.  The risks, benefits, and alternatives to carotid endarterectomy were discussed with the patient. The differences between carotid stenting and carotid endarterectomy were reviewed.  The patient voiced understanding and appears to be aware that the risks of carotid endarterectomy include but are not limited to: bleeding, infection, stroke, myocardial infarction, death, cranial nerve injuries both temporary and permanent, neck hematoma, possible airway compromise, labile blood pressure post-operatively, cerebral hyperperfusion syndrome, and possible need for additional interventions in the future. The patient is aware of the risks and agrees to proceed forward with the procedure.  DESCRIPTION: After full informed written consent was obtained from the patient, the patient was brought back to the operating room and placed supine upon the operating table.  Prior to induction, the patient received IV antibiotics.  After obtaining adequate anesthesia, the patient was placed a supine position with a shoulder roll in place and the patient's neck slightly hyperextended and rotated away from the surgical site.  The patient was prepped in the standard fashion for a carotid endarterectomy.    A first assistant was required to provide a safe and appropriate environment for  executing the surgery.  The assistant was integral in providing retraction, exposure, running suture providing suction and in the closing process.  The incision was made anterior to the sternocleidomastoid muscle and dissected down through the subcutaneous tissue.  The platysmas was opened with electrocautery.  The internal jugular vein and facial vein were identified.  The facial vein is ligated and divided between 2-0 silk ties.  The omohyoid was identified in the common carotid artery exposed at this level. The dissection was there in carried out along the carotid artery in a cranial direction.  The dissection was then carried along periadventitial plane along the common carotid artery up to the bifurcation. The external carotid artery was identified. Vessel loops were then placed around the external carotid artery as well as the superior thyroid artery. In the process of this dissection, the hypoglossal nerve was identified and protected from harm.  The internal carotid artery was then dissected circumferentially just beyond an area in the internal carotid artery distal to the plaque.    At this point, we gave the patient 7000 units of intravenous heparin.  After this was allowed to circulate for several minutes, the common carotid followed by the external carotid and then the internal carotid artery were clamped.  Arteriotomy was made in the common carotid artery with a 11 blade, and extended the arteriotomy with a Potts scissor down into the common carotid artery, then the arteriotomy was carried through the bifurcation into the internal carotid artery until I reached an area that was not diseased.  At this point, a Sundt shunt was placed.  The endarterectomy was begun in the common carotid artery with a Garment/textile technologist and carried this dissection down into the common carotid artery circumferentially.  Then I  transected the plaque at a segment where it was adherent and transected the plaque with  Potts scissors.  I then carried this dissection up into the external carotid artery.  The plaque was extracted by unclamping the external carotid artery and performing an eversion endarterectomy.  The dissection was then carried into the internal carotid artery where a  feathered end point was created.  The plaque was passed off the field as a specimen.  The distal endpoint was tacked down with 6 interrupted 7-0 Prolene sutures.  A CorMatrix arterial patch was delivered onto the field and trimmed appropriately for the artery and sewed in place with 6-0 Prolene using a 4 quadrant technique.  The medial suture line was completed and the lateral suture line was run approximately one quarter the length of the arteriotomy.  Prior to completing this patch angioplasty, the shunt was removed, the internal carotid artery was flushed and there was excellent backbleeding.  The carotid artery repair was flushed with heparinized saline and then the patch angioplasty was completed in the usual fashion.  The flow was then reestablished first to the external carotid artery and then the internal carotid artery to prevent distal embolization.   Several minutes of pressure were held and 6-0 Prolene patch sutures were used as need for hemostasis.  At this point, I placed Surgicel and Evicel topical hemostatic agents.  There was no more active bleeding in the surgical site.  The sternocleidomastoid space was closed with three interrupted 3-0 Vicryl sutures. I then reapproximated the platysma muscle with a running stitch of 3-0 Vicryl.  The skin was then closed with a running subcuticular 4-0 Monocryl.  The skin was then cleaned, dried and Dermabond was used to reinforce the skin closure.  The patient awakened and was taken to the recovery room in stable condition, following commands and moving all four extremities without any apparent deficits.    COMPLICATIONS: none  CONDITION: stable  Levora Dredge 05/13/2019<10:49 AM

## 2019-05-14 LAB — BASIC METABOLIC PANEL
Anion gap: 7 (ref 5–15)
BUN: 6 mg/dL (ref 6–20)
CO2: 22 mmol/L (ref 22–32)
Calcium: 8.5 mg/dL — ABNORMAL LOW (ref 8.9–10.3)
Chloride: 108 mmol/L (ref 98–111)
Creatinine, Ser: 0.49 mg/dL (ref 0.44–1.00)
GFR calc Af Amer: >60 mL/min (ref >60)
GFR calc non Af Amer: >60 mL/min (ref >60)
Glucose, Bld: 143 mg/dL — ABNORMAL HIGH (ref 70–99)
Potassium: 3.9 mmol/L (ref 3.5–5.1)
Sodium: 137 mmol/L (ref 135–145)

## 2019-05-14 LAB — CBC
HCT: 25.5 % — ABNORMAL LOW (ref 36.0–46.0)
Hemoglobin: 7.3 g/dL — ABNORMAL LOW (ref 12.0–15.0)
MCH: 22.8 pg — ABNORMAL LOW (ref 26.0–34.0)
MCHC: 28.6 g/dL — ABNORMAL LOW (ref 30.0–36.0)
MCV: 79.7 fL — ABNORMAL LOW (ref 80.0–100.0)
Platelets: 181 10*3/uL (ref 150–400)
RBC: 3.2 MIL/uL — ABNORMAL LOW (ref 3.87–5.11)
WBC: 19.3 10*3/uL — ABNORMAL HIGH (ref 4.0–10.5)
nRBC: 0.2 % (ref 0.0–0.2)

## 2019-05-14 LAB — MAGNESIUM: Magnesium: 1.6 mg/dL — ABNORMAL LOW (ref 1.7–2.4)

## 2019-05-14 NOTE — Progress Notes (Signed)
1 Day Post-Op   Subjective/Chief Complaint: Mild neck soreness, numbness at incision. Tenderness at back of head- from positioning.Otherwise without complaint or issue overnight.   Objective: Vital signs in last 24 hours: Temp:  [96.8 F (36 C)-97.8 F (36.6 C)] 97.5 F (36.4 C) (05/01 0800) Pulse Rate:  [60-77] 71 (05/01 0900) Resp:  [11-21] 18 (05/01 0800) BP: (97-168)/(61-97) 142/79 (05/01 0900) SpO2:  [86 %-100 %] 96 % (05/01 0900) Arterial Line BP: (81-134)/(50-107) 101/86 (05/01 0500) Last BM Date: 05/12/19  Intake/Output from previous day: 04/30 0701 - 05/01 0700 In: 3428.3 [I.V.:2022; IV Piggyback:1406.3] Out: 1585 [Urine:1575; Blood:10] Intake/Output this shift: No intake/output data recorded.  General appearance: alert and no distress Neck: no adenopathy, supple, symmetrical, trachea midline and LEFT incision- C/D/I, soft, no hematoma, no ecchymosis Resp: clear to auscultation bilaterally Cardio: regular rate and rhythm Neurologic: Alert and oriented X 3, normal strength and tone. Normal symmetric reflexes. Normal coordination and gait  Lab Results:  Recent Labs    05/13/19 0442 05/14/19 0002  WBC 10.4 19.3*  HGB 8.3* 7.3*  HCT 27.7* 25.5*  PLT 190 181   BMET Recent Labs    05/13/19 0442 05/14/19 0002  NA 140 137  K 3.4* 3.9  CL 109 108  CO2 24 22  GLUCOSE 98 143*  BUN 6 6  CREATININE 0.48 0.49  CALCIUM 8.8* 8.5*   PT/INR Recent Labs    05/13/19 0442  LABPROT 13.7  INR 1.1   ABG No results for input(s): PHART, HCO3 in the last 72 hours.  Invalid input(s): PCO2, PO2  Studies/Results: No results found.  Anti-infectives: Anti-infectives (From admission, onward)   Start     Dose/Rate Route Frequency Ordered Stop   05/13/19 1600  ceFAZolin (ANCEF) IVPB 2g/100 mL premix     2 g 200 mL/hr over 30 Minutes Intravenous Every 8 hours 05/13/19 1252 05/14/19 0001   05/13/19 0700  ceFAZolin (ANCEF) IVPB 2g/100 mL premix     2 g 200 mL/hr over  30 Minutes Intravenous On call to O.R. 05/13/19 4193 05/13/19 0804   05/13/19 0651  ceFAZolin (ANCEF) 2-4 GM/100ML-% IVPB    Note to Pharmacy: Kimberly Mcdonald   : cabinet override      05/13/19 0651 05/13/19 0833      Assessment/Plan: s/p Procedure(s): ENDARTERECTOMY CAROTID (Left) POD #1  Hemodynamically and Neurologically intact/stable. OK from Vascular standpoint to D/C home. ASA Daily Follow Up Outpatient 2 weeks. Further care/plan per primary service.  LOS: 6 days    Kimberly Mcdonald 05/14/2019

## 2019-05-14 NOTE — Progress Notes (Signed)
PROGRESS NOTE    [name]  JIR:[id number] DOB: 1965-12-08 DOA: 05/08/2019 PCP: Patient, No Pcp Per    Brief Narrative:   Kimberly Mcdonald is a 54 y.o. female with medical history significant of hypertension, substance abuse, tobacco abuse, who presents with dark stool on the right arm numbness.    Consultants:   GI, neurology, vascular surgery, cardiology  Procedures:  Antimicrobials:       Subjective: Has mild posterior HA, which is improving. No other issues.  Asking for Burger  Objective: Vitals:   05/14/19 0400 05/14/19 0500 05/14/19 0600 05/14/19 0700  BP: (!) 168/79 (!) 156/78 97/61 (!) 145/75  Pulse: 60 63 68 65  Resp: 16 16 16 19   Temp: (!) 97.5 F (36.4 C)     TempSrc: Oral     SpO2: 95% 98% 100% 98%  Weight:      Height:        Intake/Output Summary (Last 24 hours) at 05/14/2019 0914 Last data filed at 05/14/2019 0400 Gross per 24 hour  Intake 3428.31 ml  Output 1585 ml  Net 1843.31 ml   Filed Weights   05/08/19 1204 05/13/19 0656  Weight: 90.7 kg 99.8 kg    Examination:  General exam: Appears calm and comfortable ,drawsy a bit Respiratory system: Clear to auscultation. Respiratory effort normal.  No wheezing Neck: Fresh wound from Lt CEA appears clean, no discharge Cardiovascular system: S1 & S2 heard, RRR. No JVD, murmurs, rubs, gallops or clicks.  Gastrointestinal system: Abdomen is nondistended, soft and nontender. Normal bowel sounds heard. Central nervous system: Alert and oriented. Grossly intact Extremities: no edema Skin: Warm and dry Psychiatry: Mood & affect appropriate in current setting.     Data Reviewed: I have personally reviewed following labs and imaging studies  CBC: Recent Labs  Lab 05/08/19 1215 05/08/19 1427 05/09/19 0904 05/09/19 0904 05/09/19 1345 05/11/19 1219 05/12/19 0518 05/13/19 0442 05/14/19 0002  WBC 9.1   < > 7.1  --  7.3 12.0*  --  10.4 19.3*  NEUTROABS 6.6  --   --   --   --   --    --   --   --   HGB 4.0*   < > 7.4*   < > 7.7* 8.3* 7.7* 8.3* 7.3*  HCT 16.0*   < > 24.4*   < > 24.9* 28.5* 26.2* 27.7* 25.5*  MCV 62.7*   < > 72.0*  --  71.8* 74.4*  --  76.1* 79.7*  PLT 428*   < > 274  --  275 239  --  190 181   < > = values in this interval not displayed.   Basic Metabolic Panel: Recent Labs  Lab 05/08/19 1215 05/09/19 0352 05/11/19 1219 05/12/19 0518 05/13/19 0442 05/14/19 0002  NA 140  --  139  --  140 137  K 3.2*  --  3.4* 3.1* 3.4* 3.9  CL 108  --  108  --  109 108  CO2 25  --  24  --  24 22  GLUCOSE 109*  --  90  --  98 143*  BUN 8  --  5*  --  6 6  CREATININE 0.55  --  0.54  --  0.48 0.49  CALCIUM 9.1  --  8.8*  --  8.8* 8.5*  MG  --  2.0  --   --  1.8 1.6*   GFR: Estimated Creatinine Clearance: 89.6 mL/min (by C-G formula based on  SCr of 0.49 mg/dL). Liver Function Tests: Recent Labs  Lab 05/08/19 1215  AST 11*  ALT 6  ALKPHOS 93  BILITOT 0.8  PROT 7.8  ALBUMIN 3.6   No results for input(s): LIPASE, AMYLASE in the last 168 hours. No results for input(s): AMMONIA in the last 168 hours. Coagulation Profile: Recent Labs  Lab 05/08/19 1215 05/13/19 0442  INR 1.0 1.1   Cardiac Enzymes: No results for input(s): CKTOTAL, CKMB, CKMBINDEX, TROPONINI in the last 168 hours. BNP (last 3 results) No results for input(s): PROBNP in the last 8760 hours. HbA1C: No results for input(s): HGBA1C in the last 72 hours. CBG: Recent Labs  Lab 05/13/19 1326  GLUCAP 162*   Lipid Profile: No results for input(s): CHOL, HDL, LDLCALC, TRIG, CHOLHDL, LDLDIRECT in the last 72 hours. Thyroid Function Tests: No results for input(s): TSH, T4TOTAL, FREET4, T3FREE, THYROIDAB in the last 72 hours. Anemia Panel: No results for input(s): VITAMINB12, FOLATE, FERRITIN, TIBC, IRON, RETICCTPCT in the last 72 hours. Sepsis Labs: No results for input(s): PROCALCITON, LATICACIDVEN in the last 168 hours.  Recent Results (from the past 240 hour(s))  Respiratory  Panel by RT PCR (Flu A&B, Covid) - Nasopharyngeal Swab     Status: None   Collection Time: 05/08/19  1:22 PM   Specimen: Nasopharyngeal Swab  Result Value Ref Range Status   SARS Coronavirus 2 by RT PCR NEGATIVE NEGATIVE Final    Comment: (NOTE) SARS-CoV-2 target nucleic acids are NOT DETECTED. The SARS-CoV-2 RNA is generally detectable in upper respiratoy specimens during the acute phase of infection. The lowest concentration of SARS-CoV-2 viral copies this assay can detect is 131 copies/mL. A negative result does not preclude SARS-Cov-2 infection and should not be used as the sole basis for treatment or other patient management decisions. A negative result may occur with  improper specimen collection/handling, submission of specimen other than nasopharyngeal swab, presence of viral mutation(s) within the areas targeted by this assay, and inadequate number of viral copies (<131 copies/mL). A negative result must be combined with clinical observations, patient history, and epidemiological information. The expected result is Negative. Fact Sheet for Patients:  PinkCheek.be Fact Sheet for Healthcare Providers:  GravelBags.it This test is not yet ap proved or cleared by the [place] FDA and  has been authorized for detection and/or diagnosis of SARS-CoV-2 by FDA under an Emergency Use Authorization (EUA). This EUA will remain  in effect (meaning this test can be used) for the duration of the COVID-19 declaration under Section 564(b)(1) of the Act, 21 U.S.C. section 360bbb-3(b)(1), unless the authorization is terminated or revoked sooner.    Influenza A by PCR NEGATIVE NEGATIVE Final   Influenza B by PCR NEGATIVE NEGATIVE Final    Comment: (NOTE) The Xpert Xpress SARS-CoV-2/FLU/RSV assay is intended as an aid in  the diagnosis of influenza from Nasopharyngeal swab specimens and  should not be used as a sole basis for  treatment. Nasal washings and  aspirates are unacceptable for Xpert Xpress SARS-CoV-2/FLU/RSV  testing. Fact Sheet for Patients: PinkCheek.be Fact Sheet for Healthcare Providers: GravelBags.it This test is not yet approved or cleared by the [place] FDA and  has been authorized for detection and/or diagnosis of SARS-CoV-2 by  FDA under an Emergency Use Authorization (EUA). This EUA will remain  in effect (meaning this test can be used) for the duration of the  Covid-19 declaration under Section 564(b)(1) of the Act, 21  U.S.C. section 360bbb-3(b)(1), unless the authorization is  terminated  or revoked. Performed at [facility], [address] [place], [place] [address]   MRSA PCR Screening     Status: None   Collection Time: 05/13/19 12:54 PM   Specimen: Nasal Mucosa; Nasopharyngeal  Result Value Ref Range Status   MRSA by PCR NEGATIVE NEGATIVE Final    Comment:        The GeneXpert MRSA Assay (FDA approved for NASAL specimens only), is one component of a comprehensive MRSA colonization surveillance program. It is not intended to diagnose MRSA infection nor to guide or monitor treatment for MRSA infections. Performed at [facility], [address], [place], [place] [address]          Radiology Studies: No results found.      Scheduled Meds: . amLODipine  10 mg Oral Daily  . aspirin EC  81 mg Oral Q0600  . atorvastatin  40 mg Oral Daily  . Chlorhexidine Gluconate Cloth  6 each Topical Daily  . docusate sodium  100 mg Oral Daily  . ferrous sulfate  325 mg Oral BID WC  . mouth rinse  15 mL Mouth Rinse BID  . nicotine  21 mg Transdermal Daily  . pantoprazole (PROTONIX) IV  40 mg Intravenous QHS  . vitamin B-12  500 mcg Oral Daily   Continuous Infusions: . sodium chloride    . iron sucrose Stopped (05/13/19 1613)    Assessment & Plan:   Principal Problem:   GIB  (gastrointestinal bleeding) Active Problems:   Hypertension   Right arm numbness   Symptomatic anemia   Tobacco abuse   Hypokalemia   Cerebrovascular accident (CVA) (HCC)   GIB (gastrointestinal bleeding)and anemia due to acute blood loss: -s/p 4Units PRBC H/h stable -Dr. Tobi Bastos of GI is consulted-- In the setting of new stroke G.I. plans to hold off on any luminal evaluation for few weeks -received  oral and IV iron -vitamin B12 orally -PPI bid changed to PO from IV -patient reports on of NSAIDs not as much as she used to take before  Leukocytosis- Afebrile, possibly 2/2 stress induced post surgery Continue to monitor   Anemia: iron deficiency with B12 -started on oral and IV iron -oral B12 -patient will see Dr. Smith Robert as outpatient.  Hypertension: controlled -IVhydralazine as needed -on oral amlodipine Better controlled   Acute left MCA CVA -pt came in with Right arm numbness. Risk factor smoking, marijuana use, hypertension untreated - CT of head showed possible lacunar stroke -MRI brain Cluster of small acute white matter and cortical infarcts in the left MCA distribution. 2. 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 Thad Ranger. Given G.I. bleeding no antiplatelet agent at present.  Left ICA stenosis 70-99% Cardiology cleared pt for CEA as mild to mod risk. 05/13/19: s/p Lt CEA Vascular surgery recommend asa daily, f/u outpt 2 week , cleared from vasc stand point to be discharged.  Advance diet as tolerated  Tobacco and chronic marijuana abuse (>30 years) -Did counseling about importance of quitting smoking -Nicotine patch -urine drug screen positive for marijuana -pt advised on abstaining from marijuana  Hypokalemia:  Resolved after repletion - Mg level 2.0    DVT prophylaxis: SCD Code Status: Full Family Communication: None at bedside Disposition Plan: Patient is undergoing left  carotid endarterectomy Barrier:s/p Lt CEA, has leukocytosis, need to tolerate po intake. Possible d/c in am.       LOS: 6 days   Time spent: 36  minutes with more than 50% COC    Lynn Ito, MD [facility] Pager [phone]  If 7PM-7AM, please contact night-coverage [email or web address] Password [facility] 05/14/2019, 9:14 AM Patient ID: [name], female   DOB: 1965/12/29, 54 y.o.   MRN: [id number]

## 2019-05-14 NOTE — Progress Notes (Signed)
Patient is doing well this morning.  She maintains AOx4.  Upon waking assessment patient was assisted to the [facility].  She has good pulses in her periphery and good strength throughout her bilateral extremities. Her left neck carotid enterectomy is healing well with no signs of infection.  Her lung sound sare clear.  No signs of paraesthesia.  S1S1 heart sounds.  Bowel sounds good.  UOP WDL yellow in color and clear void of any odor.  She is eating breakfast well.

## 2019-05-15 LAB — CBC
HCT: 26.5 % — ABNORMAL LOW (ref 36.0–46.0)
Hemoglobin: 7.5 g/dL — ABNORMAL LOW (ref 12.0–15.0)
MCH: 23 pg — ABNORMAL LOW (ref 26.0–34.0)
MCHC: 28.3 g/dL — ABNORMAL LOW (ref 30.0–36.0)
MCV: 81.3 fL (ref 80.0–100.0)
Platelets: 162 10*3/uL (ref 150–400)
RBC: 3.26 MIL/uL — ABNORMAL LOW (ref 3.87–5.11)
WBC: 13.8 10*3/uL — ABNORMAL HIGH (ref 4.0–10.5)
nRBC: 0.4 % — ABNORMAL HIGH (ref 0.0–0.2)

## 2019-05-15 LAB — TYPE AND SCREEN
ABO/RH(D): O POS
Antibody Screen: NEGATIVE
Unit division: 0
Unit division: 0

## 2019-05-15 LAB — BPAM RBC
Blood Product Expiration Date: 202106022359
Blood Product Expiration Date: 202106022359
Unit Type and Rh: 5100
Unit Type and Rh: 5100

## 2019-05-15 LAB — PREPARE RBC (CROSSMATCH)

## 2019-05-15 MED ORDER — OXYCODONE HCL 5 MG PO TABS: 5.0000 mg | ORAL_TABLET | Freq: Four times a day (QID) | ORAL | 0 refills | Status: DC | PRN

## 2019-05-15 MED ORDER — PANTOPRAZOLE SODIUM 40 MG IV SOLR: 40.0000 mg | Freq: Every day | INTRAVENOUS | 0 refills | Status: DC

## 2019-05-15 MED ORDER — FERROUS SULFATE 325 (65 FE) MG PO TABS: 325.0000 mg | ORAL_TABLET | Freq: Two times a day (BID) | ORAL | 0 refills | Status: DC

## 2019-05-15 MED ORDER — CYANOCOBALAMIN 500 MCG PO TABS: 500.0000 ug | ORAL_TABLET | Freq: Every day | ORAL | 0 refills | Status: DC

## 2019-05-15 MED ORDER — AMLODIPINE BESYLATE 10 MG PO TABS: 10.0000 mg | ORAL_TABLET | Freq: Every day | ORAL | 0 refills | Status: DC

## 2019-05-15 MED ORDER — ASPIRIN 81 MG PO TBEC: 81.0000 mg | DELAYED_RELEASE_TABLET | Freq: Every day | ORAL | Status: DC

## 2019-05-15 MED ORDER — PANTOPRAZOLE SODIUM 40 MG PO TBEC
40.0000 mg | DELAYED_RELEASE_TABLET | Freq: Every day | ORAL | 1 refills | Status: DC
Start: 2019-05-15 — End: 2019-06-16

## 2019-05-15 MED ORDER — ATORVASTATIN CALCIUM 40 MG PO TABS: 40.0000 mg | ORAL_TABLET | Freq: Every day | ORAL | 0 refills | Status: DC

## 2019-05-15 NOTE — Plan of Care (Signed)
  Problem: Education: Goal: Knowledge of General Education information will improve Description: Including pain rating scale, medication(s)/side effects and non-pharmacologic comfort measures Outcome: Progressing   Problem: Clinical Measurements: Goal: Cardiovascular complication will be avoided Outcome: Progressing   Problem: Nutrition: Goal: Adequate nutrition will be maintained Outcome: Progressing   Problem: Coping: Goal: Level of anxiety will decrease Outcome: Progressing   

## 2019-05-15 NOTE — Discharge Summary (Signed)
Kimberly Mcdonald WJX:[id number] DOB: 1965/12/15 DOA: 05/08/2019  PCP: Patient, No Pcp Per  Admit date: 05/08/2019 Discharge date: 05/15/2019  Admitted From: Home Disposition: Home  Recommendations for Outpatient Follow-up:  1. Follow up with PCP in 1 week 2. Please obtain BMP/CBC in one week     Discharge Condition:Stable CODE STATUS:full  Diet recommendation: Heart Healthy  Brief/Interim Summary: Kimberly Mcdonald is a 54 y.o. female with medical history significant of hypertension, substance abuse, tobacco abuse, who presents with dark stool on the right arm numbness.Patient states that she has right arm numbness. She states that numbness initially started in right hand fingers, then progressed to involve the right arm. She felt that her right arm is heavy.  Pt denies taking NSAIDs.  She had  CT head and MRI with results below. She was admitted for GIb with anemia due to acute blood loss. GI was consulted.  Pt also found with acute Left MCA stroke. Neurology consulted.Had a CTA revealing 70% stenosis of left ICA at the bulb. Vascular surgery was consulted, patient underwent rotted endarterectomy after cardiology had cleared patient for surgery.  She was extensively counseled on smoking cessation.  She was started on aspirin 81 mg daily which I spoke to Dr. Allegra Lai GI today and she was okay with patient starting aspirin.   GIB (gastrointestinal bleeding)and anemia due to acute blood loss: -s/p 4Units PRBC H/h stable -Dr. Tobi Bastos of GI is consulted-- In the setting of new stroke G.I. plans to hold off on any luminal evaluation for few weeks -received  oral and IV iron -vitamin B12 orally Change iv protonix to po.   Leukocytosis- Afebrile, possibly 2/2 stress induced post surgery Improving   Anemia: iron deficiency with B12 -started on oral and IV iron -oral B12 -patient will see Dr. Smith Robert as outpatient.  Hypertension: -on oral amlodipine Better controlled  Acute  left MCA CVA -pt came in withRight arm numbness.Risk factor smoking, marijuana use, hypertension untreated -CT of head showed possible lacunar stroke -MRI brain Cluster of small acute white matter and cortical infarcts in the left MCA distribution. 2. 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 Thad Ranger. Given G.I. bleeding no antiplatelet agent at present, but cleared for aspirin by GI Dr. Allegra Lai today. F/u neurology as outpt.  Left ICA stenosis 70-99% Cardiology cleared pt for CEA as mild to mod risk. 05/13/19: s/p Lt CEA Vascular surgery recommend asa daily, f/u outpt 2 week , cleared from vasc stand point to be discharged.    Tobacco and chronic marijuana abuse(>30 years) -Did counseling about importance of quitting smoking -Nicotine patch, did not want it as outpt -urine drug screenpositive for marijuana -pt advised on abstaining from marijuana  Hypokalemia:  Resolved after repletion - Mg level 2.0 Discharge Diagnoses:  Principal Problem:   GIB (gastrointestinal bleeding) Active Problems:   Hypertension   Right arm numbness   Symptomatic anemia   Tobacco abuse   Hypokalemia   Cerebrovascular accident (CVA) [facility])    Discharge Instructions  Discharge Instructions    Call MD for:  temperature >100.4   Complete by: As directed    Diet - low sodium heart healthy   Complete by: As directed    Discharge instructions   Complete by: As directed    Monitor for bleeds Get setup with primary care within one week, need blood work Stop SMOKING Follow up with neurology and vascular surgery Follow heart healthy , low cholesterol  food   Increase activity slowly   Complete by: As directed      Allergies as of 05/15/2019   No Known Allergies     Medication List    TAKE these medications   amLODipine 10 MG tablet Commonly known as: NORVASC Take 1 tablet (10 mg total) by mouth daily. Start taking on: May 16, 2019   aspirin 81 MG EC tablet Take 1 tablet (81 mg total) by mouth daily at 6 (six) AM. Start taking on: May 16, 2019   atorvastatin 40 MG tablet Commonly known as: LIPITOR Take 1 tablet (40 mg total) by mouth daily. Start taking on: May 16, 2019   ferrous sulfate 325 (65 FE) MG tablet Take 1 tablet (325 mg total) by mouth 2 (two) times daily with a meal.   oxyCODONE 5 MG immediate release tablet Commonly known as: Oxy IR/ROXICODONE Take 1-2 tablets (5-10 mg total) by mouth every 6 (six) hours as needed for moderate pain or severe pain.   pantoprazole 40 MG tablet Commonly known as: Protonix Take 1 tablet (40 mg total) by mouth daily.   vitamin B-12 500 MCG tablet Commonly known as: CYANOCOBALAMIN Take 1 tablet (500 mcg total) by mouth daily. Start taking on: May 16, 2019      Follow-up Information    Schnier, Latina Craver, MD. Schedule an appointment as soon as possible for a visit in 2 weeks.   Specialties: Vascular Surgery, Cardiology, Radiology, Vascular Surgery Why: no studies  Contact information: [address] [place] [address] [phone]        Lonell Face, MD. Schedule an appointment as soon as possible for a visit in 1 week.   Specialty: Neurology Contact information: [phone] [facility] [address] [facility] [place] [address] [phone]        [name], MD Follow up in 2 week(s).   Specialty: Gastroenterology Contact information: [address] [place] [address] [phone]          No Known Allergies  Consultations:  Vascular, allergy, neurology   Procedures/Studies: CT ANGIO HEAD W OR WO CONTRAST  Result Date: 05/09/2019 CLINICAL DATA:  Right upper extremity numbness and weakness. EXAM: CT ANGIOGRAPHY HEAD AND NECK TECHNIQUE: Multidetector CT imaging of the head and neck was performed using the standard protocol during bolus administration of intravenous contrast. Multiplanar CT image  reconstructions and MIPs were obtained to evaluate the vascular anatomy. Carotid stenosis measurements (when applicable) are obtained utilizing NASCET criteria, using the distal internal carotid diameter as the denominator. CONTRAST:  10mL OMNIPAQUE IOHEXOL 350 MG/ML SOLN COMPARISON:  MRI of the brain May 09, 2019 FINDINGS: CT HEAD FINDINGS Brain: Few hypodense foci within left centrum semiovale in a watershed type distribution, corresponding to areas of restricted diffusion seen on prior MRI. No large acute territorial infarct. No intracranial hemorrhage, hydrocephalus, extra-axial collection on mass lesion. Vascular: No hyperdense vessel or unexpected calcification. Skull: Normal. Negative for fracture or focal lesion. Sinuses: Imaged portions are clear. Orbits: No acute finding. Review of the MIP images confirms the above findings CTA NECK FINDINGS Aortic arch: Standard branching. Imaged portion shows no evidence of aneurysm or dissection. No significant stenosis of the major arch vessel origins. However, there is extreme tortuosity of the proximal major neck arteries, unusual for patient's age. Right carotid system: No evidence of dissection, stenosis or occlusion. Left carotid system: Atherosclerotic changes with soft plaque noted in the left carotid bifurcation resulting in 70% stenosis of the  left ICA at the bulb. 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 Dominant left vertebral artery with increased tortuosity at the V1 segment without stenosis. Skeleton: Degenerative changes of the cervical spine, more pronounced at C5-6 and C6-7, where there is at least mild spinal canal stenosis and bilateral neural foraminal narrowing. Other neck: Negative Upper chest: Prominent right paratracheal and paraortic lymph nodes. Review of the MIP images confirms the above findings CTA HEAD FINDINGS Anterior circulation: Mild asymmetry of the intracranial internal carotid  arteries with decrease caliber of the left ICA, likely related to chronic stenosis at the neck. Right A1/ACA is dominant. No vessel occlusion or aneurysm identified. Posterior circulation: Dominant left vertebral artery. Mildly hypoplastic right P1/PCA segment related to the presence of prominent right posterior communicating artery. The basilar artery and posterior cerebral arteries are otherwise unremarkable. Venous sinuses: As permitted by contrast timing, patent. Anatomic variants: Hypoplastic right vertebral artery. Review of the MIP images confirms the above findings IMPRESSION: 1. 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 2. 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 3. No evidence of intracranial or cervical large vessel occlusion or aneurysm. 4. Prominent right paratracheal and paraortic lymph nodes. 5. Degenerative changes of the cervical spine, more pronounced at C5-6 and C6-7, where there is at least mild spinal canal stenosis and bilateral neural foraminal narrowing. Electronically Signed   By: Baldemar Lenis M.D.   On: 05/09/2019 12:46   CT HEAD WO CONTRAST  Result Date: 05/08/2019 CLINICAL DATA:  Numbness, possible stroke EXAM: CT HEAD WITHOUT CONTRAST TECHNIQUE: Contiguous axial images were obtained from the base of the skull through the vertex without intravenous contrast. COMPARISON:  None. FINDINGS: Brain: No definite evidence of acute infarction, hemorrhage, hydrocephalus, extra-axial collection or mass lesion/mass effect. Suspect small lacunar infarctions of the left caudate head and anterior limb of the right internal capsule/adjacent corona radiata (series 2, image 14). Vascular: No hyperdense vessel or unexpected calcification. Skull: Normal. Negative for fracture or focal lesion. Sinuses/Orbits: No acute finding. Other: None. IMPRESSION: No definite acute  intracranial pathology. Suspect small lacunar infarctions of the left caudate head and anterior limb of the right internal capsule/adjacent corona radiata, generally age indeterminate. Consider MRI to more sensitively assess for acute diffusion restricting infarction if indicated by neurological signs and symptoms. Electronically Signed   By: Lauralyn Primes M.D.   On: 05/08/2019 12:54   CT ANGIO NECK W OR WO CONTRAST  Result Date: 05/09/2019 CLINICAL DATA:  Right upper extremity numbness and weakness. EXAM: CT ANGIOGRAPHY HEAD AND NECK TECHNIQUE: Multidetector CT imaging of the head and neck was performed using the standard protocol during bolus administration of intravenous contrast. Multiplanar CT image reconstructions and MIPs were obtained to evaluate the vascular anatomy. Carotid stenosis measurements (when applicable) are obtained utilizing NASCET criteria, using the distal internal carotid diameter as the denominator. CONTRAST:  75mL OMNIPAQUE IOHEXOL 350 MG/ML SOLN COMPARISON:  MRI of the brain May 09, 2019 FINDINGS: CT HEAD FINDINGS Brain: Few hypodense foci within left centrum semiovale in a watershed type distribution, corresponding to areas of restricted diffusion seen on prior MRI. No large acute territorial infarct. No intracranial hemorrhage, hydrocephalus, extra-axial collection on mass lesion. Vascular: No hyperdense vessel or unexpected calcification. Skull: Normal. Negative for fracture or focal lesion. Sinuses: Imaged portions are clear. Orbits: No acute finding. Review of the MIP images confirms  the above findings CTA NECK FINDINGS Aortic arch: Standard branching. Imaged portion shows no evidence of aneurysm or dissection. No significant stenosis of the major arch vessel origins. However, there is extreme tortuosity of the proximal major neck arteries, unusual for patient's age. Right carotid system: No evidence of dissection, stenosis or occlusion. Left carotid system: Atherosclerotic  changes with soft plaque noted in the left carotid bifurcation resulting in 70% stenosis of the left ICA at the bulb. 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 Dominant left vertebral artery with increased tortuosity at the V1 segment without stenosis. Skeleton: Degenerative changes of the cervical spine, more pronounced at C5-6 and C6-7, where there is at least mild spinal canal stenosis and bilateral neural foraminal narrowing. Other neck: Negative Upper chest: Prominent right paratracheal and paraortic lymph nodes. Review of the MIP images confirms the above findings CTA HEAD FINDINGS Anterior circulation: Mild asymmetry of the intracranial internal carotid arteries with decrease caliber of the left ICA, likely related to chronic stenosis at the neck. Right A1/ACA is dominant. No vessel occlusion or aneurysm identified. Posterior circulation: Dominant left vertebral artery. Mildly hypoplastic right P1/PCA segment related to the presence of prominent right posterior communicating artery. The basilar artery and posterior cerebral arteries are otherwise unremarkable. Venous sinuses: As permitted by contrast timing, patent. Anatomic variants: Hypoplastic right vertebral artery. Review of the MIP images confirms the above findings IMPRESSION: 1. 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 2. 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 3. No evidence of intracranial or cervical large vessel occlusion or aneurysm. 4. Prominent right paratracheal and paraortic lymph nodes. 5. Degenerative changes of the cervical spine, more pronounced at C5-6 and C6-7, where there is at least mild spinal canal stenosis and bilateral neural foraminal narrowing. Electronically Signed   By: Baldemar Lenis M.D.   On: 05/09/2019 12:46   MR BRAIN WO  CONTRAST  Result Date: 05/09/2019 CLINICAL DATA:  Right arm numbness. EXAM: MRI HEAD WITHOUT CONTRAST TECHNIQUE: Multiplanar, multiecho pulse sequences of the brain and surrounding structures were obtained without intravenous contrast. COMPARISON:  Head CT from yesterday FINDINGS: Brain: Cluster of small cortical and subcortical foci of restricted diffusion in the high left frontal and anterior parietal lobes. No hemorrhage, hydrocephalus, or masslike finding. Chronic lacune the anterior right corona radiata. Normal brain volume. Vascular: Normal flow voids. Skull and upper cervical spine: Normal marrow signal Sinuses/Orbits: Retention cyst appearance in the midline nasopharynx. No acute finding. IMPRESSION: 1. 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 2. 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 Electronically Signed   By: Marnee Spring M.D.   On: 05/09/2019 05:11   ECHOCARDIOGRAM COMPLETE BUBBLE STUDY  Result Date: 05/10/2019    ECHOCARDIOGRAM REPORT   Patient Name:   EMREY THORNLEY Date of Exam: 05/09/2019 Medical Rec #:  [id number]             Height:       64.0 in Accession #:    [id number]            Weight:       200.0 lb Date of Birth:  [birth year]             BSA:          1.956 m Patient Age:    39 years  BP:           169/94 mmHg Patient Gender: F                     HR:           80 bpm. Exam Location:  [facility] Procedure: 2D Echo Indications:     Stroke 434.91/I63.9  History:         Patient has no prior history of Echocardiogram examinations.                  Stroke; Risk Factors:Hypertension and Current Smoker. Asthma.  Sonographer:     Avanell Shackleton Referring Phys:  [id number] LESLIE REYNOLDS Diagnosing Phys: Nelva Bush MD IMPRESSIONS  1. Left ventricular ejection fraction, by estimation, is 55 to 60%. The left ventricle has normal function. The left ventricle has no regional wall motion abnormalities. There is mild left ventricular  hypertrophy. Left ventricular diastolic parameters are consistent with Grade I diastolic dysfunction (impaired relaxation).  2. Right ventricular systolic function is normal. The right ventricular size is normal. There is normal pulmonary artery systolic pressure.  3. Left atrial size was moderately dilated.  4. Right atrial size was mildly dilated.  5. The mitral valve is normal in structure. Mild mitral valve regurgitation. No evidence of mitral stenosis.  6. The aortic valve is tricuspid. Aortic valve regurgitation is trivial. No aortic stenosis is present.  7. The inferior vena cava is normal in size with greater than 50% respiratory variability, suggesting right atrial pressure of 3 mmHg.  8. Agitated saline contrast bubble study was negative, with no evidence of any interatrial shunt. FINDINGS  Left Ventricle: Left ventricular ejection fraction, by estimation, is 55 to 60%. The left ventricle has normal function. The left ventricle has no regional wall motion abnormalities. The left ventricular internal cavity size was normal in size. There is  mild left ventricular hypertrophy. Left ventricular diastolic parameters are consistent with Grade I diastolic dysfunction (impaired relaxation). Right Ventricle: The right ventricular size is normal. No increase in right ventricular wall thickness. Right ventricular systolic function is normal. There is normal pulmonary artery systolic pressure. The tricuspid regurgitant velocity is 2.81 m/s, and  with an assumed right atrial pressure of 3 mmHg, the estimated right ventricular systolic pressure is 13.2 mmHg. Left Atrium: Left atrial size was moderately dilated. Right Atrium: Right atrial size was mildly dilated. Pericardium: Trivial pericardial effusion is present. The pericardial effusion is posterior to the left ventricle. Mitral Valve: The mitral valve is normal in structure. Mild mitral valve regurgitation. No evidence of mitral valve stenosis. Tricuspid Valve: The  tricuspid valve is grossly normal. Tricuspid valve regurgitation is mild. Aortic Valve: The aortic valve is tricuspid. Aortic valve regurgitation is trivial. No aortic stenosis is present. Pulmonic Valve: The pulmonic valve was normal in structure. Pulmonic valve regurgitation is trivial. No evidence of pulmonic stenosis. Aorta: The aortic root is normal in size and structure. Pulmonary Artery: The pulmonary artery is of normal size. Venous: The inferior vena cava is normal in size with greater than 50% respiratory variability, suggesting right atrial pressure of 3 mmHg. IAS/Shunts: No atrial level shunt detected by color flow Doppler. Agitated saline contrast was given intravenously to evaluate for intracardiac shunting. Agitated saline contrast bubble study was negative, with no evidence of any interatrial shunt.  LEFT VENTRICLE PLAX 2D LVIDd:         4.44 cm  Diastology LVIDs:  3.15 cm  LV e' lateral:   8.59 cm/s LV PW:         0.98 cm  LV E/e' lateral: 11.6 LV IVS:        1.09 cm  LV e' medial:    7.18 cm/s LVOT diam:     2.00 cm  LV E/e' medial:  13.9 LVOT Area:     3.14 cm  RIGHT VENTRICLE             IVC RV S prime:     14.40 cm/s  IVC diam: 1.69 cm TAPSE (M-mode): 2.9 cm LEFT ATRIUM             Index       RIGHT ATRIUM           Index LA diam:        3.80 cm 1.94 cm/m  RA Area:     18.90 cm LA Vol (A2C):   98.8 ml 50.50 ml/m RA Volume:   52.50 ml  26.84 ml/m LA Vol (A4C):   82.7 ml 42.27 ml/m LA Biplane Vol: 90.5 ml 46.26 ml/m   AORTA Ao Root diam: 3.30 cm MITRAL VALVE                TRICUSPID VALVE MV Area (PHT): 3.76 cm     TR Peak grad:   31.6 mmHg MV Decel Time: 202 msec     TR Vmax:        281.00 cm/s MR Peak grad: 180.3 mmHg MR Mean grad: 126.3 mmHg    SHUNTS MR Vmax:      671.33 cm/s   Systemic Diam: 2.00 cm MR Vmean:     542.3 cm/s MV E velocity: 99.90 cm/s MV A velocity: 126.00 cm/s MV E/A ratio:  0.79 Christopher End MD Electronically signed by Yvonne Kendall MD Signature Date/Time:  05/10/2019/6:29:35 AM    Final       Subjective: No complaints Discharge Exam: Vitals:   05/15/19 0758 05/15/19 0939  BP: 138/64   Pulse: 72 76  Resp: 18 16  Temp: 98.5 F (36.9 C)   SpO2: 96% 96%   Vitals:   05/14/19 1615 05/14/19 2350 05/15/19 0758 05/15/19 0939  BP: 138/78 132/74 138/64   Pulse: 78 73 72 76  Resp: 19 20 18 16   Temp: 98.1 F (36.7 C) 98.3 F (36.8 C) 98.5 F (36.9 C)   TempSrc: Oral Oral Oral   SpO2: 96% 96% 96% 96%  Weight:      Height:        General: Pt is alert, awake, not in acute distress Neck: Left CEA appears to be healing and clean Cardiovascular: RRR, S1/S2 +, no rubs, no gallops Respiratory: CTA bilaterally, no wheezing, no rhonchi Abdominal: Soft, NT, ND, bowel sounds + Extremities: no edema, no cyanosis    The results of significant diagnostics from this hospitalization (including imaging, microbiology, ancillary and laboratory) are listed below for reference.     Microbiology: Recent Results (from the past 240 hour(s))  Respiratory Panel by RT PCR (Flu A&B, Covid) - Nasopharyngeal Swab     Status: None   Collection Time: 05/08/19  1:22 PM   Specimen: Nasopharyngeal Swab  Result Value Ref Range Status   SARS Coronavirus 2 by RT PCR NEGATIVE NEGATIVE Final    Comment: (NOTE) SARS-CoV-2 target nucleic acids are NOT DETECTED. The SARS-CoV-2 RNA is generally detectable in upper respiratoy specimens during the acute phase of infection. The lowest concentration of SARS-CoV-2  viral copies this assay can detect is 131 copies/mL. A negative result does not preclude SARS-Cov-2 infection and should not be used as the sole basis for treatment or other patient management decisions. A negative result may occur with  improper specimen collection/handling, submission of specimen other than nasopharyngeal swab, presence of viral mutation(s) within the areas targeted by this assay, and inadequate number of viral copies (<131 copies/mL). A  negative result must be combined with clinical observations, patient history, and epidemiological information. The expected result is Negative. Fact Sheet for Patients:  https://www.moore.com/https://www.fda.gov/media/142436/download Fact Sheet for Healthcare Providers:  https://www.young.biz/https://www.fda.gov/media/142435/download This test is not yet ap proved or cleared by the [place] FDA and  has been authorized for detection and/or diagnosis of SARS-CoV-2 by FDA under an Emergency Use Authorization (EUA). This EUA will remain  in effect (meaning this test can be used) for the duration of the COVID-19 declaration under Section 564(b)(1) of the Act, 21 U.S.C. section 360bbb-3(b)(1), unless the authorization is terminated or revoked sooner.    Influenza A by PCR NEGATIVE NEGATIVE Final   Influenza B by PCR NEGATIVE NEGATIVE Final    Comment: (NOTE) The Xpert Xpress SARS-CoV-2/FLU/RSV assay is intended as an aid in  the diagnosis of influenza from Nasopharyngeal swab specimens and  should not be used as a sole basis for treatment. Nasal washings and  aspirates are unacceptable for Xpert Xpress SARS-CoV-2/FLU/RSV  testing. Fact Sheet for Patients: https://www.moore.com/https://www.fda.gov/media/142436/download Fact Sheet for Healthcare Providers: https://www.young.biz/https://www.fda.gov/media/142435/download This test is not yet approved or cleared by the [place] FDA and  has been authorized for detection and/or diagnosis of SARS-CoV-2 by  FDA under an Emergency Use Authorization (EUA). This EUA will remain  in effect (meaning this test can be used) for the duration of the  Covid-19 declaration under Section 564(b)(1) of the Act, 21  U.S.C. section 360bbb-3(b)(1), unless the authorization is  terminated or revoked. Performed at [facility], [address] [place], [place] [address]   MRSA PCR Screening     Status: None   Collection Time: 05/13/19 12:54 PM   Specimen: Nasal Mucosa; Nasopharyngeal  Result Value Ref Range Status   MRSA by  PCR NEGATIVE NEGATIVE Final    Comment:        The GeneXpert MRSA Assay (FDA approved for NASAL specimens only), is one component of a comprehensive MRSA colonization surveillance program. It is not intended to diagnose MRSA infection nor to guide or monitor treatment for MRSA infections. Performed at [facility], [address] [place], [place] [address]      Labs: BNP (last 3 results) No results for input(s): BNP in the last 8760 hours. Basic Metabolic Panel: Recent Labs  Lab 05/09/19 0352 05/11/19 1219 05/12/19 0518 05/13/19 0442 05/14/19 0002  NA  --  139  --  140 137  K  --  3.4* 3.1* 3.4* 3.9  CL  --  108  --  109 108  CO2  --  24  --  24 22  GLUCOSE  --  90  --  98 143*  BUN  --  5*  --  6 6  CREATININE  --  0.54  --  0.48 0.49  CALCIUM  --  8.8*  --  8.8* 8.5*  MG 2.0  --   --  1.8 1.6*   Liver Function Tests: No results for input(s): AST, ALT, ALKPHOS, BILITOT, PROT, ALBUMIN in the last 168 hours. No results for input(s): LIPASE, AMYLASE in the last  168 hours. No results for input(s): AMMONIA in the last 168 hours. CBC: Recent Labs  Lab 05/09/19 1345 05/09/19 1345 05/11/19 1219 05/12/19 0518 05/13/19 0442 05/14/19 0002 05/15/19 0453  WBC 7.3  --  12.0*  --  10.4 19.3* 13.8*  HGB 7.7*   < > 8.3* 7.7* 8.3* 7.3* 7.5*  HCT 24.9*   < > 28.5* 26.2* 27.7* 25.5* 26.5*  MCV 71.8*  --  74.4*  --  76.1* 79.7* 81.3  PLT 275  --  239  --  190 181 162   < > = values in this interval not displayed.   Cardiac Enzymes: No results for input(s): CKTOTAL, CKMB, CKMBINDEX, TROPONINI in the last 168 hours. BNP: Invalid input(s): POCBNP CBG: Recent Labs  Lab 05/13/19 1326  GLUCAP 162*   D-Dimer No results for input(s): DDIMER in the last 72 hours. Hgb A1c No results for input(s): HGBA1C in the last 72 hours. Lipid Profile No results for input(s): CHOL, HDL, LDLCALC, TRIG, CHOLHDL, LDLDIRECT in the last 72 hours. Thyroid function studies No  results for input(s): TSH, T4TOTAL, T3FREE, THYROIDAB in the last 72 hours.  Invalid input(s): FREET3 Anemia work up No results for input(s): VITAMINB12, FOLATE, FERRITIN, TIBC, IRON, RETICCTPCT in the last 72 hours. Urinalysis    Component Value Date/Time   COLORURINE YELLOW (A) 05/09/2019 1537   APPEARANCEUR CLEAR (A) 05/09/2019 1537   LABSPEC 1.034 (H) 05/09/2019 1537   PHURINE 7.0 05/09/2019 1537   GLUCOSEU NEGATIVE 05/09/2019 1537   HGBUR NEGATIVE 05/09/2019 1537   BILIRUBINUR NEGATIVE 05/09/2019 1537   KETONESUR 5 (A) 05/09/2019 1537   PROTEINUR NEGATIVE 05/09/2019 1537   NITRITE NEGATIVE 05/09/2019 1537   LEUKOCYTESUR NEGATIVE 05/09/2019 1537   Sepsis Labs Invalid input(s): PROCALCITONIN,  WBC,  LACTICIDVEN Microbiology Recent Results (from the past 240 hour(s))  Respiratory Panel by RT PCR (Flu A&B, Covid) - Nasopharyngeal Swab     Status: None   Collection Time: 05/08/19  1:22 PM   Specimen: Nasopharyngeal Swab  Result Value Ref Range Status   SARS Coronavirus 2 by RT PCR NEGATIVE NEGATIVE Final    Comment: (NOTE) SARS-CoV-2 target nucleic acids are NOT DETECTED. The SARS-CoV-2 RNA is generally detectable in upper respiratoy specimens during the acute phase of infection. The lowest concentration of SARS-CoV-2 viral copies this assay can detect is 131 copies/mL. A negative result does not preclude SARS-Cov-2 infection and should not be used as the sole basis for treatment or other patient management decisions. A negative result may occur with  improper specimen collection/handling, submission of specimen other than nasopharyngeal swab, presence of viral mutation(s) within the areas targeted by this assay, and inadequate number of viral copies (<131 copies/mL). A negative result must be combined with clinical observations, patient history, and epidemiological information. The expected result is Negative. Fact Sheet for Patients:   https://www.moore.com/ Fact Sheet for Healthcare Providers:  https://www.young.biz/ This test is not yet ap proved or cleared by the [place] FDA and  has been authorized for detection and/or diagnosis of SARS-CoV-2 by FDA under an Emergency Use Authorization (EUA). This EUA will remain  in effect (meaning this test can be used) for the duration of the COVID-19 declaration under Section 564(b)(1) of the Act, 21 U.S.C. section 360bbb-3(b)(1), unless the authorization is terminated or revoked sooner.    Influenza A by PCR NEGATIVE NEGATIVE Final   Influenza B by PCR NEGATIVE NEGATIVE Final    Comment: (NOTE) The Xpert Xpress SARS-CoV-2/FLU/RSV assay is intended as an  aid in  the diagnosis of influenza from Nasopharyngeal swab specimens and  should not be used as a sole basis for treatment. Nasal washings and  aspirates are unacceptable for Xpert Xpress SARS-CoV-2/FLU/RSV  testing. Fact Sheet for Patients: https://www.moore.com/ Fact Sheet for Healthcare Providers: https://www.young.biz/ This test is not yet approved or cleared by the [place] FDA and  has been authorized for detection and/or diagnosis of SARS-CoV-2 by  FDA under an Emergency Use Authorization (EUA). This EUA will remain  in effect (meaning this test can be used) for the duration of the  Covid-19 declaration under Section 564(b)(1) of the Act, 21  U.S.C. section 360bbb-3(b)(1), unless the authorization is  terminated or revoked. Performed at [facility], [address] [place], [place] [address]   MRSA PCR Screening     Status: None   Collection Time: 05/13/19 12:54 PM   Specimen: Nasal Mucosa; Nasopharyngeal  Result Value Ref Range Status   MRSA by PCR NEGATIVE NEGATIVE Final    Comment:        The GeneXpert MRSA Assay (FDA approved for NASAL specimens only), is one component of a comprehensive MRSA  colonization surveillance program. It is not intended to diagnose MRSA infection nor to guide or monitor treatment for MRSA infections. Performed at [facility], [address] [place], [place] [address]      Time coordinating discharge: Over 30 minutes  SIGNED:   Lynn Ito, MD  [facility] 05/15/2019, 12:18 PM Pager   If 7PM-7AM, please contact night-coverage [email or web address] Password [email or web address]

## 2019-05-15 NOTE — Progress Notes (Signed)
2 Days Post-Op   Subjective/Chief Complaint: Complains of headache- consistent with her previous sinusitis headaches in the past. Denies nausea. Denies change in vision, speech or strength. Otherwise without complaint. BP normal.   Objective: Vital signs in last 24 hours: Temp:  [97.6 F (36.4 C)-98.5 F (36.9 C)] 98.5 F (36.9 C) (05/02 0758) Pulse Rate:  [65-85] 72 (05/02 0758) Resp:  [18-23] 18 (05/02 0758) BP: (128-163)/(60-91) 138/64 (05/02 0758) SpO2:  [96 %-100 %] 96 % (05/02 0758) Last BM Date: 05/13/19  Intake/Output from previous day: 05/01 0701 - 05/02 0700 In: 480 [P.O.:480] Out: -  Intake/Output this shift: No intake/output data recorded.  General appearance: alert and no distress Neck: no adenopathy, supple, symmetrical, trachea midline and LEFT- incision- C/D/I, soft, no hematoma, mild ecchymosis Cardio: regular rate and rhythm Neurologic: Alert and oriented X 3, normal strength and tone. Normal symmetric reflexes. Normal coordination and gait  Lab Results:  Recent Labs    05/14/19 0002 05/15/19 0453  WBC 19.3* 13.8*  HGB 7.3* 7.5*  HCT 25.5* 26.5*  PLT 181 162   BMET Recent Labs    05/13/19 0442 05/14/19 0002  NA 140 137  K 3.4* 3.9  CL 109 108  CO2 24 22  GLUCOSE 98 143*  BUN 6 6  CREATININE 0.48 0.49  CALCIUM 8.8* 8.5*   PT/INR Recent Labs    05/13/19 0442  LABPROT 13.7  INR 1.1   ABG No results for input(s): PHART, HCO3 in the last 72 hours.  Invalid input(s): PCO2, PO2  Studies/Results: No results found.  Anti-infectives: Anti-infectives (From admission, onward)   Start     Dose/Rate Route Frequency Ordered Stop   05/13/19 1600  ceFAZolin (ANCEF) IVPB 2g/100 mL premix     2 g 200 mL/hr over 30 Minutes Intravenous Every 8 hours 05/13/19 1252 05/14/19 0001   05/13/19 0700  ceFAZolin (ANCEF) IVPB 2g/100 mL premix     2 g 200 mL/hr over 30 Minutes Intravenous On call to O.R. 05/13/19 1610 05/13/19 0804   05/13/19 0651   ceFAZolin (ANCEF) 2-4 GM/100ML-% IVPB    Note to Pharmacy: [name]   : cabinet override      05/13/19 0651 05/13/19 0833      Assessment/Plan: s/p Procedure(s): ENDARTERECTOMY CAROTID (Left) OK from Vascular standpoint to D/C home.  Continue ASA Follow Up as scheduled outpatient  LOS: 7 days    Eli Hose A 05/15/2019

## 2019-05-15 NOTE — Progress Notes (Signed)
Patient adequate for discharge. No concerns or questions at this time. AVS reviewed and provided to patient.

## 2019-05-15 NOTE — Progress Notes (Signed)
Plan of care reviewed with pt. Medications discussed prior to administration; pt verbalized understanding; pain addressed with PRN meds; pt slept overnight with no complaints. Safety precautions maintained; Bed low and locked; call bell within reach.

## 2019-05-16 LAB — SURGICAL PATHOLOGY

## 2019-05-16 LAB — FACTOR 5 LEIDEN

## 2019-05-26 ENCOUNTER — Encounter (INDEPENDENT_AMBULATORY_CARE_PROVIDER_SITE_OTHER): Payer: Self-pay | Admitting: Vascular Surgery

## 2019-05-26 ENCOUNTER — Ambulatory Visit (INDEPENDENT_AMBULATORY_CARE_PROVIDER_SITE_OTHER): Payer: Self-pay | Admitting: Vascular Surgery

## 2019-05-26 ENCOUNTER — Other Ambulatory Visit: Payer: Self-pay

## 2019-05-26 VITALS — BP 129/77 | HR 84 | Resp 16 | Wt 209.0 lb

## 2019-05-26 DIAGNOSIS — I10 Essential (primary) hypertension: Secondary | ICD-10-CM

## 2019-05-26 DIAGNOSIS — I6523 Occlusion and stenosis of bilateral carotid arteries: Secondary | ICD-10-CM

## 2019-05-26 DIAGNOSIS — K922 Gastrointestinal hemorrhage, unspecified: Secondary | ICD-10-CM

## 2019-05-26 DIAGNOSIS — M1990 Unspecified osteoarthritis, unspecified site: Secondary | ICD-10-CM

## 2019-05-27 ENCOUNTER — Telehealth (INDEPENDENT_AMBULATORY_CARE_PROVIDER_SITE_OTHER): Payer: Self-pay | Admitting: Vascular Surgery

## 2019-05-27 ENCOUNTER — Other Ambulatory Visit (INDEPENDENT_AMBULATORY_CARE_PROVIDER_SITE_OTHER): Payer: Self-pay | Admitting: Nurse Practitioner

## 2019-05-27 MED ORDER — HYDROCODONE-ACETAMINOPHEN 5-325 MG PO TABS: 1.0000 | ORAL_TABLET | Freq: Four times a day (QID) | ORAL | 0 refills | Status: DC | PRN

## 2019-05-27 NOTE — Telephone Encounter (Addendum)
Called to let us know that the RX (oxycodone) of has not been filled yet. Pharmacy [facility] ([address], [place]). Patient was last seen yesterday (05/26/19) with a F/U with GS. (post endarterectomy carotid).

## 2019-05-27 NOTE — Telephone Encounter (Signed)
It is sent.

## 2019-05-27 NOTE — Telephone Encounter (Signed)
Per Dr Gilda Crease we can send in Norco 5-325mg  #25 take 1 tablet po every 6 hours prn no refills into [facility]. Patient has been made aware with medication being sent into pharmacy

## 2019-05-27 NOTE — Telephone Encounter (Signed)
I will speak with Dr Gilda Crease

## 2019-05-27 NOTE — Telephone Encounter (Deleted)
C-

## 2019-05-27 NOTE — Progress Notes (Unsigned)
Subjective:    Patient ID: Kimberly Mcdonald, female    DOB: 12-07-1965, 54 y.o.   MRN: [id number] No chief complaint on file.   HPI  Review of Systems     Objective:   Physical Exam  LMP 10/01/2016 (Approximate)   Past Medical History:  Diagnosis Date  . Allergy    seasonal  . Anemia due to blood loss 05/08/2019  . Arthritis    knees and back  . Cerebrovascular accident (CVA) (HCC)   . HTN (hypertension)   . Hyperlipidemia   . Hypertension   . Substance abuse (HCC)    Marijuana smoker x35 yrs    Social History   Socioeconomic History  . Marital status: Legally Separated    Spouse name: Not on file  . Number of children: Not on file  . Years of education: Not on file  . Highest education level: Not on file  Occupational History  . Not on file  Tobacco Use  . Smoking status: Former Smoker    Packs/day: 0.70    Types: Cigarettes    Quit date: 05/08/2019    Years since quitting: 0.0  . Smokeless tobacco: Never Used  Substance and Sexual Activity  . Alcohol use: Yes    Alcohol/week: 3.0 standard drinks    Types: 3 Cans of beer per week  . Drug use: Yes    Frequency: 7.0 times per week    Types: Marijuana  . Sexual activity: Not Currently    Birth control/protection: None  Other Topics Concern  . Not on file  Social History Narrative  . Not on file   Social Determinants of Health   Financial Resource Strain:   . Difficulty of Paying Living Expenses:   Food Insecurity:   . Worried About Programme researcher, broadcasting/film/video in the Last Year:   . Barista in the Last Year:   Transportation Needs:   . Freight forwarder (Medical):   Marland Kitchen Lack of Transportation (Non-Medical):   Physical Activity:   . Days of Exercise per Week:   . Minutes of Exercise per Session:   Stress:   . Feeling of Stress :   Social Connections:   . Frequency of Communication with Friends and Family:   . Frequency of Social Gatherings with Friends and Family:   . Attends  Religious Services:   . Active Member of Clubs or Organizations:   . Attends Banker Meetings:   Marland Kitchen Marital Status:   Intimate Partner Violence:   . Fear of Current or Ex-Partner:   . Emotionally Abused:   Marland Kitchen Physically Abused:   . Sexually Abused:     Past Surgical History:  Procedure Laterality Date  . ENDARTERECTOMY Left 05/13/2019   Procedure: ENDARTERECTOMY CAROTID;  Surgeon: Renford Dills, MD;  Location: [facility] ORS;  Service: Vascular;  Laterality: Left;  . TUBAL LIGATION      Family History  Problem Relation Age of Onset  . Diabetes Mother   . Hypertension Mother   . Cancer Father   . Alcohol abuse Sister     No Known Allergies     Assessment & Plan:   There are no diagnoses linked to this encounter.  Current Outpatient Medications on File Prior to Visit  Medication Sig Dispense Refill  . amLODipine (NORVASC) 10 MG tablet Take 1 tablet (10 mg total) by mouth daily. 30 tablet 0  . aspirin EC 81 MG EC tablet Take 1 tablet (81 mg  total) by mouth daily at 6 (six) AM.    . atorvastatin (LIPITOR) 40 MG tablet Take 1 tablet (40 mg total) by mouth daily. 30 tablet 0  . ferrous sulfate 325 (65 FE) MG tablet Take 1 tablet (325 mg total) by mouth 2 (two) times daily with a meal. 30 tablet 0  . pantoprazole (PROTONIX) 40 MG tablet Take 1 tablet (40 mg total) by mouth daily. 30 tablet 1  . vitamin B-12 (CYANOCOBALAMIN) 500 MCG tablet Take 1 tablet (500 mcg total) by mouth daily. 30 tablet 0   No current facility-administered medications on file prior to visit.    There are no Patient Instructions on file for this visit. No follow-ups on file.   Kris Hartmann, NP

## 2019-06-01 ENCOUNTER — Ambulatory Visit: Payer: Disability Insurance | Admitting: Pharmacy Technician

## 2019-06-01 ENCOUNTER — Other Ambulatory Visit: Payer: Self-pay

## 2019-06-01 DIAGNOSIS — Z79899 Other long term (current) drug therapy: Secondary | ICD-10-CM

## 2019-06-01 NOTE — Progress Notes (Signed)
Completed financial assistance application for [place] due to recent hospital visit. Patient agreed to be responsible for gathering financial information and forwarding to appropriate department in [facility].    Completed [facility] application and contract.  Patient agreed to all terms of the [facility] contract.    Patient approved to receive medication assistance at [facility] until time for re-certification in 3344, and as long as eligibility criteria continues to be met.   Provided patient with community resource material based on her particular needs.    Referred patient to [facility].  Referred patient for MTM.  [facility]

## 2019-06-07 ENCOUNTER — Encounter (INDEPENDENT_AMBULATORY_CARE_PROVIDER_SITE_OTHER): Payer: Self-pay | Admitting: Vascular Surgery

## 2019-06-07 DIAGNOSIS — I6529 Occlusion and stenosis of unspecified carotid artery: Secondary | ICD-10-CM | POA: Insufficient documentation

## 2019-06-07 NOTE — Progress Notes (Signed)
Patient ID: Kimberly Mcdonald, female   DOB: [birth year], 54 y.o.   MRN: [id number]  Chief Complaint  Patient presents with  . Follow-up    [facility] 2wk post carotid endarterectomy    HPI Kimberly Mcdonald is a 54 y.o. female.    The patient is seen for follow up evaluation of carotid stenosis status post left  carotid endarterectomy on 05/13/2019.  There were no post operative problems or complications related to the surgery.  The patient denies neck or incisional pain.  The patient denies interval amaurosis fugax. There is no recent history of TIA symptoms or focal motor deficits. There is no prior documented CVA.  The patient denies headache although she did have problems with headache in the immediate postoperative period.  The patient is taking enteric-coated aspirin 81 mg daily.  The patient has a history of coronary artery disease, no recent episodes of angina or shortness of breath. The patient denies PAD or claudication symptoms. There is a history of hyperlipidemia which is being treated with a statin.     Past Medical History:  Diagnosis Date  . Allergy    seasonal  . Anemia due to blood loss 05/08/2019  . Arthritis    knees and back  . Cerebrovascular accident (CVA) (HCC)   . HTN (hypertension)   . Hyperlipidemia   . Hypertension   . 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 ENDARTERECTOMY Left 05/13/2019   Procedure: ENDARTERECTOMY CAROTID;  Surgeon: Renford Dills, MD;  Location: [facility] ORS;  Service: Vascular;  Laterality: Left;  . TUBAL LIGATION        No Known Allergies  Current Outpatient Medications  Medication Sig Dispense Refill  . amLODipine (NORVASC) 10 MG tablet Take 1 tablet (10 mg total) by mouth daily. 30 tablet 0  . aspirin EC 81 MG EC tablet Take 1 tablet (81 mg total) by mouth daily at 6 (six) AM.    . atorvastatin (LIPITOR) 40 MG tablet Take 1 tablet (40 mg  total) by mouth daily. 30 tablet 0  . ferrous sulfate 325 (65 FE) MG tablet Take 1 tablet (325 mg total) by mouth 2 (two) times daily with a meal. 30 tablet 0  . pantoprazole (PROTONIX) 40 MG tablet Take 1 tablet (40 mg total) by mouth daily. 30 tablet 1  . vitamin B-12 (CYANOCOBALAMIN) 500 MCG tablet Take 1 tablet (500 mcg total) by mouth daily. 30 tablet 0  . HYDROcodone-acetaminophen (NORCO/VICODIN) 5-325 MG tablet Take 1 tablet by mouth every 6 (six) hours as needed for moderate pain. 25 tablet 0   No current facility-administered medications for this visit.        Physical Exam BP 129/77 (BP Location: Right Arm)   Pulse 84   Resp 16   Wt 209 lb (94.8 kg)   LMP 10/01/2016 (Approximate)   BMI 37.02 kg/m  Gen:  WD/WN, NAD Skin: incision C/D/I; neurologically patient is intact     Assessment/Plan: 1. Bilateral carotid artery stenosis Recommend:  The patient is s/p successful left CEA  Continue antiplatelet therapy as prescribed Continue management of CAD, HTN and Hyperlipidemia Healthy heart diet,  encouraged exercise at least 4 times per week  Follow up in 6 months with duplex ultrasound and physical exam based on the patient's carotid surgery  - VAS US CAROTID; Future       Levora Dredge 06/07/2019, 12:02 PM  This note was created with Dragon medical transcription system.  Any errors from dictation are unintentional.

## 2019-06-16 ENCOUNTER — Other Ambulatory Visit: Payer: Self-pay

## 2019-06-16 ENCOUNTER — Ambulatory Visit: Payer: Medicaid Other | Admitting: Urology

## 2019-06-16 VITALS — BP 153/96 | HR 94 | Ht 62.5 in | Wt 213.4 lb

## 2019-06-16 DIAGNOSIS — Z8673 Personal history of transient ischemic attack (TIA), and cerebral infarction without residual deficits: Secondary | ICD-10-CM

## 2019-06-16 DIAGNOSIS — K922 Gastrointestinal hemorrhage, unspecified: Secondary | ICD-10-CM

## 2019-06-16 DIAGNOSIS — I10 Essential (primary) hypertension: Secondary | ICD-10-CM

## 2019-06-16 DIAGNOSIS — Z720 Tobacco use: Secondary | ICD-10-CM

## 2019-06-16 MED ORDER — FERROUS SULFATE 325 (65 FE) MG PO TABS: 325.0000 mg | ORAL_TABLET | Freq: Two times a day (BID) | ORAL | 0 refills | Status: DC

## 2019-06-16 MED ORDER — ATORVASTATIN CALCIUM 40 MG PO TABS: 40.0000 mg | ORAL_TABLET | Freq: Every day | ORAL | 0 refills | Status: DC

## 2019-06-16 MED ORDER — PANTOPRAZOLE SODIUM 40 MG PO TBEC: 40.0000 mg | DELAYED_RELEASE_TABLET | Freq: Every day | ORAL | 0 refills | Status: DC

## 2019-06-16 MED ORDER — AMLODIPINE BESYLATE 10 MG PO TABS: 10.0000 mg | ORAL_TABLET | Freq: Every day | ORAL | 0 refills | Status: DC

## 2019-06-16 NOTE — Progress Notes (Signed)
Patient: Kimberly Mcdonald Female    DOB: 07/21/1965   54 y.o.   MRN: [id number] Visit Date: 06/16/2019  Today's Provider: [place]   Chief Complaint  Patient presents with  . Establish Care    was pt 2 years ago, had neck surgery and was referred to re-establish   Subjective:    HPI Kimberly Mcdonald is a 54 y.o. with HTN, hx of CVA and tobacco dependence who presents today to re-establish care.    HTN BP 153/96.  Ran out of medications two days ago.  Will need repeated blood work.   Bilateral carotid artery stenosis S/p successful left CEA   GIB Consulted GI while in hospital - will wait a few due to recent history of stroke  She denies any further blood in stool or hematemesis   History of CVA Has not followed up with neurology  Tobacco abuse Has smoked just a few cigarettes since leaving the hospital   No Known Allergies Previous Medications   AMLODIPINE (NORVASC) 10 MG TABLET    Take 1 tablet (10 mg total) by mouth daily.   ASPIRIN EC 81 MG EC TABLET    Take 1 tablet (81 mg total) by mouth daily at 6 (six) AM.   ATORVASTATIN (LIPITOR) 40 MG TABLET    Take 1 tablet (40 mg total) by mouth daily.   FERROUS SULFATE 325 (65 FE) MG TABLET    Take 1 tablet (325 mg total) by mouth 2 (two) times daily with a meal.   HYDROCODONE-ACETAMINOPHEN (NORCO/VICODIN) 5-325 MG TABLET    Take 1 tablet by mouth every 6 (six) hours as needed for moderate pain.   PANTOPRAZOLE (PROTONIX) 40 MG TABLET    Take 1 tablet (40 mg total) by mouth daily.   VITAMIN B-12 (CYANOCOBALAMIN) 500 MCG TABLET    Take 1 tablet (500 mcg total) by mouth daily.    Review of Systems  Social History   Tobacco Use  . Smoking status: Former Smoker    Packs/day: 0.70    Types: Cigarettes    Quit date: 05/08/2019    Years since quitting: 0.1  . Smokeless tobacco: Never Used  . Tobacco comment: only 2 cigarettes since surgery a month ago  Substance Use Topics  . Alcohol use: Not Currently     Alcohol/week: 3.0 standard drinks    Types: 3 Cans of beer per week    Comment: have not been drinking since surgery   Objective:   BP (!) 153/96   Pulse 94   Ht 5' 2.5" (1.588 m)   Wt 213 lb 6.4 oz (96.8 kg)   LMP 10/01/2016 (Approximate)   SpO2 98%   BMI 38.41 kg/m   Physical Exam Constitutional:  Well nourished. Alert and oriented, No acute distress. HEENT: [place] AT, mask in place.  Trachea midline.  Left carotid endarterectomy   incision well healed  Cardiovascular: No clubbing, cyanosis, or edema. Respiratory: Normal respiratory effort, no increased work of breathing. Neurologic: Grossly intact, no focal deficits, moving all 4 extremities. Psychiatric: Normal mood and affect.      Assessment & Plan:   1. Essential hypertension - Basic metabolic panel - CBC with Differential/Platelet - Lipid panel - Follow up one month   2. History of CVA (cerebrovascular accident) - Ambulatory referral to Neurology -cleared by GI to take a daily ASA  3. Tobacco abuse - trying to quit  - info on quit smoking hot line given   4. GI bleed -  will need a referral in a few months for GI work up     [facility]   [facility]

## 2019-06-17 ENCOUNTER — Encounter: Payer: Self-pay | Admitting: Neurology

## 2019-06-17 LAB — CBC WITH DIFFERENTIAL/PLATELET
Basophils Absolute: 0 10*3/uL (ref 0.0–0.2)
Basos: 0 %
EOS (ABSOLUTE): 0.1 10*3/uL (ref 0.0–0.4)
Eos: 1 %
Hematocrit: 41.1 % (ref 34.0–46.6)
Hemoglobin: 12.8 g/dL (ref 11.1–15.9)
Immature Grans (Abs): 0 10*3/uL (ref 0.0–0.1)
Immature Granulocytes: 0 %
Lymphocytes Absolute: 2.8 10*3/uL (ref 0.7–3.1)
Lymphs: 33 %
MCH: 26.1 pg — ABNORMAL LOW (ref 26.6–33.0)
MCHC: 31.1 g/dL — ABNORMAL LOW (ref 31.5–35.7)
MCV: 84 fL (ref 79–97)
Monocytes Absolute: 0.6 10*3/uL (ref 0.1–0.9)
Monocytes: 7 %
Neutrophils Absolute: 5.1 10*3/uL (ref 1.4–7.0)
Neutrophils: 59 %
Platelets: 279 10*3/uL (ref 150–450)
RBC: 4.91 x10E6/uL (ref 3.77–5.28)
RDW: 23.1 % — ABNORMAL HIGH (ref 11.7–15.4)
WBC: 8.7 10*3/uL (ref 3.4–10.8)

## 2019-06-17 LAB — LIPID PANEL
Chol/HDL Ratio: 3.1 ratio (ref 0.0–4.4)
Cholesterol, Total: 162 mg/dL (ref 100–199)
HDL: 53 mg/dL (ref >39)
LDL Chol Calc (NIH): 86 mg/dL (ref 0–99)
Triglycerides: 131 mg/dL (ref 0–149)
VLDL Cholesterol Cal: 23 mg/dL (ref 5–40)

## 2019-06-17 LAB — BASIC METABOLIC PANEL
BUN/Creatinine Ratio: 24 — ABNORMAL HIGH (ref 9–23)
BUN: 12 mg/dL (ref 6–24)
CO2: 20 mmol/L (ref 20–29)
Calcium: 9.6 mg/dL (ref 8.7–10.2)
Chloride: 107 mmol/L — ABNORMAL HIGH (ref 96–106)
Creatinine, Ser: 0.5 mg/dL — ABNORMAL LOW (ref 0.57–1.00)
GFR calc Af Amer: 126 mL/min/{1.73_m2} (ref >59)
GFR calc non Af Amer: 109 mL/min/{1.73_m2} (ref >59)
Glucose: 111 mg/dL — ABNORMAL HIGH (ref 65–99)
Potassium: 3.6 mmol/L (ref 3.5–5.2)
Sodium: 143 mmol/L (ref 134–144)

## 2019-07-06 NOTE — Progress Notes (Signed)
NEUROLOGY CONSULTATION NOTE  Artasia Thang MRN: [id number] DOB: 05/08/1965  Referring provider: Nori Riis, PA-C Primary care provider: Nori Riis, PA-C  Reason for consult:  stroke  HISTORY OF PRESENT ILLNESS: Kimberly Mcdonald. Kimberly Mcdonald is a 54 year old right-handed female with HTN and tobacco abuse who presents for stroke.  History supplemented by hospital and referring provider's notes.  She was admitted to [facility] on 05/08/2019 for GI bleed with anemia due to acute blood loss.  She also reported transient right upper extremity numbness and weakness a couple of days prior to admission.  MRI of brain personally reviewed showed multiple small acute infarcts in the left MCA territory.  Echocardiogram showed  EF 55-60% with no cardiac source of emboli.  LDL was 80, Hgb A1c was 5.2 and sed rate 65.  Hypercoagulable panel was negative.  CTA of head and neck personally reviewed showed 70% left ICA stenosis at the bulb.  She underwent left CEA.  She was subsequently cleared by GI to start aspirin.  Since discharge, she has been doing well.  She denies any residual deficits.  Since the surgery, she has had pain and spasms in the left side of her neck and mouth whenever she eats.  She also has numbness in the left side of her neck as well.  Vascular surgery is aware and they told her that it will pass.  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 Allergy    seasonal    . Anemia due to blood loss 05/08/2019  . Arthritis    knees and back  . Cerebrovascular accident (CVA) ([place])   . HTN (hypertension)   . Hyperlipidemia   . Hypertension   . Substance abuse ([place])    Marijuana smoker x35 yrs    PAST SURGICAL HISTORY: Past Surgical History:  Procedure Laterality Date  . ENDARTERECTOMY Left 05/13/2019   Procedure: ENDARTERECTOMY CAROTID;  Surgeon: Katha Cabal, MD;  Location: [facility] ORS;  Service: Vascular;  Laterality: Left;  . TUBAL LIGATION      MEDICATIONS: Current Outpatient Medications on File Prior to Visit  Medication Sig Dispense Refill  . amLODipine (NORVASC) 10 MG tablet Take 1 tablet (10 mg total) by mouth daily. 90 tablet 0  . aspirin EC 81 MG EC tablet Take 1 tablet (81 mg total) by mouth daily at 6 (six) AM.    . atorvastatin (LIPITOR) 40 MG tablet Take 1 tablet (40 mg total) by mouth daily. 90 tablet 0  . ferrous sulfate 325 (65 FE) MG tablet Take 1 tablet (325 mg total) by mouth 2 (two) times daily with a meal. 180 tablet 0  . HYDROcodone-acetaminophen (NORCO/VICODIN) 5-325 MG tablet Take 1 tablet by mouth every 6 (six) hours as needed for moderate pain. 25 tablet 0  . pantoprazole (PROTONIX) 40 MG tablet Take 1 tablet (40 mg total) by mouth daily. 90 tablet 0  . vitamin B-12 (CYANOCOBALAMIN) 500 MCG tablet Take 1 tablet (500 mcg total) by mouth daily. 30 tablet 0   No current facility-administered medications on file  prior to visit.    ALLERGIES: No Known Allergies  FAMILY HISTORY: Family History  Problem Relation Age of Onset  . Diabetes Mother   . Hypertension Mother   . Cancer Father   . Alcohol abuse Sister     SOCIAL HISTORY: Social History   Socioeconomic History  . Marital status: Legally Separated    Spouse name: Not on file  . Number of children: Not on file  . Years of education: Not on file  . Highest education level: Not on file  Occupational History  . Not on file  Tobacco Use  . Smoking status:  Former Smoker    Packs/day: 0.70    Types: Cigarettes    Quit date: 05/08/2019    Years since quitting: 0.1  . Smokeless tobacco: Never Used  . Tobacco comment: only 2 cigarettes since surgery a month ago  Vaping Use  . Vaping Use: Never used  Substance and Sexual Activity  . Alcohol use: Not Currently    Alcohol/week: 3.0 standard drinks    Types: 3 Cans of beer per week    Comment: have not been drinking since surgery  . Drug use: Yes    Frequency: 3.0 times per week    Types: Marijuana    Comment: unsure exactly of use  . Sexual activity: Not Currently    Birth control/protection: None  Other Topics Concern  . Not on file  Social History Narrative  . Not on file   Social Determinants of Health   Financial Resource Strain:   . Difficulty of Paying Living Expenses:   Food Insecurity:   . Worried About Programme researcher, broadcasting/film/video in the Last Year:   . Barista in the Last Year:   Transportation Needs: Unknown  . Lack of Transportation (Medical): No  . Lack of Transportation (Non-Medical): Not on file  Physical Activity: Unknown  . Days of Exercise per Week: 0 days  . Minutes of Exercise per Session: Not on file  Stress:   . Feeling of Stress :   Social Connections:   . Frequency of Communication with Friends and Family:   . Frequency of Social Gatherings with Friends and Family:   . Attends Religious Services:   . Active Member of Clubs or Organizations:   . Attends Banker Meetings:   Marland Kitchen Marital Status:   Intimate Partner Violence: Unknown  . Fear of Current or Ex-Partner: No  . Emotionally Abused: Not on file  . Physically Abused: Not on file  . Sexually Abused: Not on file    PHYSICAL EXAM: Blood pressure (!) 157/96, pulse 88, resp. rate 18, height 5\' 3"  (1.6 m), weight 213 lb (96.6 kg), last menstrual period 10/01/2016, SpO2 97 %. General: No acute distress.  Patient appears well-groomed.   Head:  Normocephalic/atraumatic Eyes:  fundi examined  but not visualized Neck: supple, no paraspinal tenderness, full range of motion Back: No paraspinal tenderness Heart: regular rate and rhythm Lungs: Clear to auscultation bilaterally. Vascular: No carotid bruits. Neurological Exam: Mental status: alert and oriented to person, place, and time, recent and remote memory intact, fund of knowledge intact, attention and concentration intact, speech fluent and not dysarthric, language intact. Cranial nerves: CN I: not tested CN II: pupils equal, round and reactive to light, visual fields intact CN III, IV, VI:  full range of motion, no nystagmus, no ptosis CN V: facial sensation intact CN VII: upper and lower face symmetric CN VIII: hearing intact CN  IX, X: gag intact, uvula midline CN XI: sternocleidomastoid and trapezius muscles intact CN XII: tongue midline Bulk & Tone: normal, no fasciculations. Motor:  5/5 throughout  Sensation:  Pinprick and vibration sensation intact. Deep Tendon Reflexes:  2+ throughout, toes downgoing.  Finger to nose testing:  Without dysmetria.  Heel to shin:  Without dysmetria.  Gait:  Normal station and stride.  Able to turn and tandem walk. Romberg negative.  IMPRESSION: 1. Multiple infarcts in left MCA territory secondary to left ICA stenosis s/p CEA 2.  Neuralgia, secondary to CEA 3.  Hypertension 4.  Hyperlipidemia 5.  Tobacco use disorder  PLAN: 1.  For neuralgia, start gabapentin 100mg  three times daily 2.  ASA 81mg  daily for secondary stroke prevention 3.  Atorvastatin 40mg  (LDL goal less than 70) 4.  Amlodipine (blood pressure goal less than 130/90) 5.  Glycemic control (Hgb A1c goal less than 7.0%) 6.  Smoking cessation: 7.  Follow up in 4 months   Thank you for allowing me to take part in the care of this patient.  , DO

## 2019-07-07 ENCOUNTER — Ambulatory Visit (INDEPENDENT_AMBULATORY_CARE_PROVIDER_SITE_OTHER): Payer: Self-pay | Admitting: Neurology

## 2019-07-07 ENCOUNTER — Other Ambulatory Visit: Payer: Self-pay

## 2019-07-07 ENCOUNTER — Encounter: Payer: Self-pay | Admitting: Neurology

## 2019-07-07 VITALS — BP 157/96 | HR 88 | Resp 18 | Ht 63.0 in | Wt 213.0 lb

## 2019-07-07 DIAGNOSIS — I10 Essential (primary) hypertension: Secondary | ICD-10-CM

## 2019-07-07 DIAGNOSIS — I63232 Cerebral infarction due to unspecified occlusion or stenosis of left carotid arteries: Secondary | ICD-10-CM

## 2019-07-07 DIAGNOSIS — E785 Hyperlipidemia, unspecified: Secondary | ICD-10-CM

## 2019-07-07 DIAGNOSIS — M792 Neuralgia and neuritis, unspecified: Secondary | ICD-10-CM

## 2019-07-07 DIAGNOSIS — I6522 Occlusion and stenosis of left carotid artery: Secondary | ICD-10-CM

## 2019-07-07 DIAGNOSIS — Z720 Tobacco use: Secondary | ICD-10-CM

## 2019-07-07 MED ORDER — GABAPENTIN 100 MG PO CAPS
100.0000 mg | ORAL_CAPSULE | Freq: Three times a day (TID) | ORAL | 3 refills | Status: DC
Start: 2019-07-07 — End: 2019-08-09

## 2019-07-07 NOTE — Patient Instructions (Signed)
1.  For nerve pain in neck, start gabapentin 100mg  three times daily 2.  Continue aspirin 81mg  daily 3.  Continue atorvastatin 40mg  daily 4.  Continue amlodipine for blood pressure control 5.  Follow up in 4 months

## 2019-07-14 ENCOUNTER — Ambulatory Visit: Payer: Medicaid Other

## 2019-07-14 ENCOUNTER — Other Ambulatory Visit: Payer: Self-pay

## 2019-07-19 ENCOUNTER — Ambulatory Visit: Payer: Medicaid Other | Admitting: Physician Assistant

## 2019-07-19 ENCOUNTER — Other Ambulatory Visit: Payer: Self-pay

## 2019-07-19 DIAGNOSIS — E785 Hyperlipidemia, unspecified: Secondary | ICD-10-CM

## 2019-07-19 DIAGNOSIS — I10 Essential (primary) hypertension: Secondary | ICD-10-CM

## 2019-07-19 NOTE — Progress Notes (Signed)
Patient: Kimberly Mcdonald Female    DOB: 16-Sep-1965   54 y.o.   MRN: [id number] Visit Date: 07/19/2019  Today's Provider: Mariane Duval, PA-C   Chief Complaint  Patient presents with  . Hypertension   Subjective:    Kimberly Mcdonald is a 54 yo female presenting for follow-up w/ [facility] via a phone visit.  Labs from 6/3 (CMP, CBC w/ diff, lipids) reviewed; all look okay, see plan for further discussion of CV risk mgmt (incl. lipids).  BPs from June on record are in the 150s/90s, though BP from 05/13 (after discharge from hospital) is 129/77. One of the readings in June may have been d/t being out of amlodipine during her 6/3 office visit, though BP was comparable during office visit w/ neurology on 6/24. She still smokes "a coupla' cigarettes just here and there" (further quantification is difficult) but is trying to get that to full abstinence. She has measured her BP intermittently at home with a home cuff (daughter is also a CNA), but she did not record the BPs other than the one she took today. She says they were all around the same as the recording she took just prior to today's call, which was 125/86. She reports taking medications as prescribed and does not need any refills.  She saw neurology in late June; they started her on gabapentin for nerve pain, but she's not yet filled that prescription, as she says the pain has reduced considerably.  She notes both her knees are causing her considerable pain. She saw ortho in the past who recommended bilateral TKA, but she notes that wasn't possible at the time b/c she had no insurance and no financial assistance. Note from ortho on 09/16/2017 notes end stage OA with need for TKA, but that time rather than money was the key obstacle at that time.     No Known Allergies Previous Medications   ACETAMINOPHEN (TYLENOL) 325 MG TABLET    Take 650 mg by mouth every 6 (six) hours as needed.   AMLODIPINE (NORVASC) 10 MG TABLET    Take 1 tablet (10 mg  total) by mouth daily.   ASPIRIN EC 81 MG EC TABLET    Take 1 tablet (81 mg total) by mouth daily at 6 (six) AM.   ATORVASTATIN (LIPITOR) 40 MG TABLET    Take 1 tablet (40 mg total) by mouth daily.   FERROUS SULFATE 325 (65 FE) MG TABLET    Take 1 tablet (325 mg total) by mouth 2 (two) times daily with a meal.   GABAPENTIN (NEURONTIN) 100 MG CAPSULE    Take 1 capsule (100 mg total) by mouth 3 (three) times daily.   PANTOPRAZOLE (PROTONIX) 40 MG TABLET    Take 1 tablet (40 mg total) by mouth daily.   VITAMIN B-12 (CYANOCOBALAMIN) 500 MCG TABLET    Take 1 tablet (500 mcg total) by mouth daily.   Review of Systems: Musculoskeletal: Chronic arthralgias in bilateral knees (see HPI) Neuro: Improving nerve pain (see HPI) All other systems reviewed and are negative   Social History   Tobacco Use  . Smoking status: Current Some Day Smoker    Packs/day: Only smokes "a coupla' cigarettes just here and there"     Types: Cigarettes    Last attempt to quit: 05/08/2019    Years since quitting: 0.1  . Smokeless tobacco: Never Used  . Tobacco comment: only 2 cigarettes since surgery a month ago  Substance Use Topics  . Alcohol use:  Not Currently    Alcohol/week: 3.0 standard drinks    Types: 3 Cans of beer per week    Comment: have not been drinking since surgery   Objective:     Physical exam not possible due to visit occurring via telephone     Assessment & Plan:     Hypertension Cont w/ amlodipine for now Measure BP 1x/day at same time when rested and w/out exogenous influence (e.g., caffeine); record measurements to provide to [facility] and other HCPs Adjust medication as needed pending the above and add'l f/u visits See tobacco abuse for smoking cessation   Tobacco abuse Encouraged on progress toward quitting Emphasized importance of fully quitting Offered add'l help/resources, but she said she didn't think she needed anything more right now Will cont to f/u, and encouraged her to  contact [facility] if she changes her mind about wanting add'l help  S/p CVA, management of CV risk See HTN and tobacco abuse above LDL at 86, taking atorva 40; could consider increasing atorva to 80 if tolerated (e.g., to get to typical goal of 70 or lower for people with est. ASCVD); however, add'l. CV risk reduction would be worth a shared decision-making conversation prior to up-titration considering available trial data for "more vs. less" statin therapy If LDL remains ~86 at next lipid panel, can discuss at that point  Nerve pain Advised gabapentin Rxed by neuro is optional/for symptomatic relief  End-stage osteoarthritis of bilateral knees Will attempt to get her back to ortho; she states she has financial assistance now, so may be feasible, though she reports it expires at the end of Nov Will also need to take into consideration her recent CVA and CV risk when weighing surgery as an option, so will need input from neuro if surgery is otherwise an option If ortho/TKA not possible, needs pain management referral given [facility] doesn't do pain mgmt; could consider starting with non-controlled substance (e.g., duloxetine) for PO options.  General Quick f/u visits via phone visits focused on BP in 2 wks; will determine add'l. f/u needs based on this phone visit Next full f/u can be in 3 months (unless needed sooner per pt or based on above phone f/u); check lipid panel prior to 3 mo f/u   Mariane Duval, PA-C   [facility]

## 2019-07-26 ENCOUNTER — Telehealth: Payer: Self-pay | Admitting: General Practice

## 2019-07-26 NOTE — Telephone Encounter (Addendum)
Made patient a fu appt for 7/27  ----- Message from Rolm Gala, NP sent at 07/21/2019  1:42 PM EDT ----- Pls can you schedule a follow up appointment evening clinic, pls notify her with date and time. Thank you

## 2019-08-09 ENCOUNTER — Ambulatory Visit: Payer: Medicaid Other | Admitting: Adult Health

## 2019-08-09 ENCOUNTER — Other Ambulatory Visit: Payer: Self-pay

## 2019-08-09 VITALS — BP 118/86

## 2019-08-09 DIAGNOSIS — Z720 Tobacco use: Secondary | ICD-10-CM

## 2019-08-09 DIAGNOSIS — I10 Essential (primary) hypertension: Secondary | ICD-10-CM

## 2019-08-09 NOTE — Progress Notes (Signed)
  Patient: Kimberly Mcdonald Female    DOB: 1965/03/21   54 y.o.   MRN: [id number] Visit Date: 08/09/2019  Today's Provider: Shawn Route, NP   Patient consents to Telephone/or Telehealth visit and two identifiers have been used to establish patient's identity prior to visit  Chief Complaint  Patient presents with  . Hypertension    2 week fu for blood pressure; patient has BP log to review   Subjective:    HPI This is a 54 y/o female seen for f/u hypertension. She offers no complaints.  She states that her blood pressure has been well controlled.  She is on amlodipine 10 mg daily.  She reports medication adherence.  She denies any headache, dizziness, visual changes, nausea and vomiting.  She continues to smoke and expresses the desire to quit  No Known Allergies Previous Medications   ACETAMINOPHEN (TYLENOL) 325 MG TABLET    Take 650 mg by mouth every 6 (six) hours as needed.   AMLODIPINE (NORVASC) 10 MG TABLET    Take 1 tablet (10 mg total) by mouth daily.   ASPIRIN EC 81 MG EC TABLET    Take 1 tablet (81 mg total) by mouth daily at 6 (six) AM.   ATORVASTATIN (LIPITOR) 40 MG TABLET    Take 1 tablet (40 mg total) by mouth daily.   FERROUS SULFATE 325 (65 FE) MG TABLET    Take 1 tablet (325 mg total) by mouth 2 (two) times daily with a meal.   GABAPENTIN (NEURONTIN) 100 MG CAPSULE    Take 1 capsule (100 mg total) by mouth 3 (three) times daily.   PANTOPRAZOLE (PROTONIX) 40 MG TABLET    Take 1 tablet (40 mg total) by mouth daily.   VITAMIN B-12 (CYANOCOBALAMIN) 500 MCG TABLET    Take 1 tablet (500 mcg total) by mouth daily.    Review of Systems  Constitutional: Negative for appetite change and fatigue.  Eyes: Negative for visual disturbance.  Respiratory: Negative for chest tightness and shortness of breath.   Cardiovascular: Negative for chest pain, palpitations and leg swelling.  Musculoskeletal: Negative for arthralgias.  Skin: Negative for rash.  Neurological:  Negative for dizziness, weakness and headaches.    Social History   Tobacco Use  . Smoking status: Current Some Day Smoker    Packs/day: 0.70    Types: Cigarettes    Last attempt to quit: 05/08/2019    Years since quitting: 0.2  . Smokeless tobacco: Never Used  . Tobacco comment: only 2 cigarettes since surgery a month ago  Substance Use Topics  . Alcohol use: Not Currently    Alcohol/week: 3.0 standard drinks    Types: 3 Cans of beer per week    Comment: have not been drinking since surgery   Objective:   BP (!) 118/86 Comment: Pt stated  LMP 10/01/2016 (Approximate)   Physical Exam Unable to perform as this is a virtual visit    Assessment & Plan:   1. Hypertension not at goal Continue amlodipine 10 mg daily.  Patient advised to adhere to a low-salt and low-fat diet.  Continue to monitor blood pressure daily.  Signs and symptoms of CVA reviewed with patient.  2. Tobacco abuse Smoking cessation advice given.  Risk of smoking reviewed with patient.  Nicotine replacement offered     Shawn Route, NP   [facility]

## 2019-09-05 ENCOUNTER — Ambulatory Visit: Payer: Medicaid Other | Admitting: Pharmacist

## 2019-09-05 ENCOUNTER — Other Ambulatory Visit: Payer: Self-pay

## 2019-09-05 DIAGNOSIS — Z79899 Other long term (current) drug therapy: Secondary | ICD-10-CM

## 2019-09-05 NOTE — Progress Notes (Signed)
Medication Management Clinic Visit Note  Patient: Kimberly Mcdonald MRN: [id number] Date of Birth: [birth year] PCP: Patient, No Pcp Per   Kimberly Mcdonald 54 y.o. female presents for a initial MTM visit today.  Two identifiers were used to verify identity (address, DOB).  LMP 10/01/2016 (Approximate)   Patient Information   Past Medical History:  Diagnosis Date  . Allergy    seasonal  . Anemia due to blood loss 05/08/2019  . Arthritis    knees and back  . Cerebrovascular accident (CVA) ([facility])   . HTN (hypertension)   . Hyperlipidemia   . Hypertension   . Substance abuse ([facility])    Marijuana smoker x35 yrs      Past Surgical History:  Procedure Laterality Date  . ENDARTERECTOMY Left 05/13/2019   Procedure: ENDARTERECTOMY CAROTID;  Surgeon: Renford Dills, MD;  Location: [facility] ORS;  Service: Vascular;  Laterality: Left;  . TUBAL LIGATION       Family History  Problem Relation Age of Onset  . Diabetes Mother   . Hypertension Mother   . Cancer Father   . Alcohol abuse Sister     New Diagnoses (since last visit): NA  Family Support: Good  Lifestyle Diet: Breakfast:  Sausage, biscuit, coffee, OJ, grits Lunch:  Usually combines with breakfast Dinner:  Meat/vegetabl   Outpatient Encounter Medications as of 09/05/2019  Medication Sig  . acetaminophen (TYLENOL) 325 MG tablet Take 500 mg by mouth every 6 (six) hours as needed. PATIENT TAKES ONCE DAILY.  Marland Kitchen amLODipine (NORVASC) 10 MG tablet Take 1 tablet (10 mg total) by mouth daily.  Marland Kitchen aspirin EC 81 MG EC tablet Take 1 tablet (81 mg total) by mouth daily at 6 (six) AM.  . atorvastatin (LIPITOR) 40 MG tablet Take 1 tablet (40 mg total) by mouth daily.  . ferrous sulfate 325 (65 FE) MG tablet Take 1 tablet (325 mg total) by mouth 2 (two) times daily with a meal.  . pantoprazole (PROTONIX) 40 MG tablet Take 1 tablet (40 mg total) by mouth daily.  . vitamin B-12 (CYANOCOBALAMIN) 500 MCG tablet Take 1 tablet (500 mcg  total) by mouth daily. (Patient taking differently: Take 1,000 mcg by mouth daily. )   No facility-administered encounter medications on file as of 09/05/2019.    Assessment and Plan:  Last ED visit:  April 2021 Last Visit to PCP:  Last week Next Visit to PCP:  6 months Specialist Visit:  November Dental Exam:  ? Eye Exam:  ? Pelvic/PAP:  1992 DEXA:  ?  Last year saw orthopedic doctor at [facility] Mammogram:  ? Flu Vaccine:  ? COVID Vaccine:  Moderna, both doses received 07/16/19.    Family Support:  Good Breakfast:  Sausage, biscuit, coffee, OJ, grits Lunch:  Usually combines with breakfast Dinner:  Meat/vegetables  Adherence:  Patient reports not missing medication doses, and takes all medications in the morning when checking blood pressure.  She does not use a pill box.   Hypertension: Amlodipine 10 mg.  Goal BP <130/80 mmHg.  Patient reports blood pressure typically runs around 130s SBP.  She checks each day, as provider has advised patient to call when SBP exceeds 150 mmHg four times in a week.  Patient keeps a log of blood pressures.  She is very motivated to keep blood pressure low, as she recently was hospitalized for stroke.  PMH of CVA with CAD s/p Carotid Endarterectomy (April 2021):  Aspirin 81 mg, Atorvastatin 40 mg.  LDL at  86.  Patient denies leg myalgias after recently starting Lipitor post recent discharge, but has a difficult time assessing if she has any musculoskeletal pain in the presence of severe osteoarthritis.    S/p GI Bleed:  Patient admitted with GI bleed in April 2021, but cleared by GI to resume aspirin.  CBC appeared to be normalized in June 2021, but baseline unclear.  Education regarding signs/symptoms of bleeding.  Advised to avoid Motrin, Aleve in the setting of stroke risk.    Low Iron:  Iron 10 ug/dL, ferritin 2 ng/mL.  Currently on ferrous sulfate.  Patient reports constipation, and advised to try fiber supplementation, as well as increased intake of  vegetables, fruits, and whole grain pastas/breads.  Osteoarthritis:  Patient takes APAP 1000 mg x 1 each day, but still reports extreme pain.  Ortho MD recommends surgery, but patient is still undecided.  Patient may take APAP up to 1000 mg Q6 hours PRN (max 4000 mg/day).  Tobacco abuse:  Patient smokes cigarettes every now and then, but not ready to quit at this time.  Made patient aware of resources available through 1-800-QUITNOW.  Follow up in 1 year.  Cherly Hensen, PharmD 09/05/2019 5:07 PM

## 2019-09-06 ENCOUNTER — Other Ambulatory Visit: Payer: Self-pay | Admitting: Urology

## 2019-09-14 ENCOUNTER — Other Ambulatory Visit: Payer: Self-pay

## 2019-09-14 MED ORDER — FERROUS SULFATE 325 (65 FE) MG PO TABS: 325.0000 mg | ORAL_TABLET | Freq: Two times a day (BID) | ORAL | 0 refills | Status: DC

## 2019-09-14 MED ORDER — AMLODIPINE BESYLATE 10 MG PO TABS: 10.0000 mg | ORAL_TABLET | Freq: Every day | ORAL | 0 refills | Status: DC

## 2019-09-14 MED ORDER — PANTOPRAZOLE SODIUM 40 MG PO TBEC: 40.0000 mg | DELAYED_RELEASE_TABLET | Freq: Every day | ORAL | 0 refills | Status: DC

## 2019-09-14 MED ORDER — CYANOCOBALAMIN 1000 MCG PO TABS: 1000.0000 ug | ORAL_TABLET | Freq: Every day | ORAL | 0 refills | Status: AC

## 2019-09-14 MED ORDER — ASPIRIN 81 MG PO TBEC: 81.0000 mg | DELAYED_RELEASE_TABLET | Freq: Every day | ORAL | 0 refills | Status: DC

## 2019-09-14 MED ORDER — ATORVASTATIN CALCIUM 40 MG PO TABS: 40.0000 mg | ORAL_TABLET | Freq: Every day | ORAL | 0 refills | Status: DC

## 2019-11-29 ENCOUNTER — Ambulatory Visit (INDEPENDENT_AMBULATORY_CARE_PROVIDER_SITE_OTHER): Payer: Medicaid Other | Admitting: Nurse Practitioner

## 2019-11-29 ENCOUNTER — Encounter (INDEPENDENT_AMBULATORY_CARE_PROVIDER_SITE_OTHER): Payer: Medicaid Other

## 2019-11-29 NOTE — Progress Notes (Deleted)
NEUROLOGY FOLLOW UP OFFICE NOTE  Kimberly Mcdonald [id number]   Subjective:  Kimberly Mcdonald. Kimberly Mcdonald is a 54 year old right-handed female with HTN and tobacco abuse who follows up for stroke.  UPDATE: Current medications:  ASA 81mg  daily, atorvastatin 40mg  daily, amlodipine, gabapentin 100mg  three times daily.  ***  HISTORY: She was admitted to [facility] on 05/08/2019 for GI bleed with anemia due to acute blood loss.  She also reported transient right upper extremity numbness and weakness a couple of days prior to admission.  MRI of brain personally reviewed showed multiple small acute infarcts in the left MCA territory.  Echocardiogram showed  EF 55-60% with no cardiac source of emboli.  LDL was 80, Hgb A1c was 5.2 and sed rate 65.  Hypercoagulable panel was negative.  CTA of head and neck personally reviewed showed 70% left ICA stenosis at the bulb.  She underwent left CEA.  She was subsequently cleared by GI to start aspirin.  Since discharge, she has been doing well.  She denies any residual deficits.  Since the surgery, she has had pain and spasms in the left side of her neck and mouth whenever she eats.  She also has numbness in the left side of her neck as well.  Vascular surgery is aware and they told her that it will pass.  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 Allergy    seasonal  . Anemia due to blood loss 05/08/2019  . Arthritis    knees and back  .  Cerebrovascular accident (CVA) (HCC)   . HTN (hypertension)   . Hyperlipidemia   . Hypertension   . Substance abuse (HCC)    Marijuana smoker x35 yrs    MEDICATIONS: Current Outpatient Medications on File Prior to Visit  Medication Sig Dispense Refill  . acetaminophen (TYLENOL) 325 MG tablet Take 500 mg by mouth every 6 (six) hours as needed. PATIENT TAKES ONCE DAILY.    [facility] amLODipine (NORVASC) 10 MG tablet Take 1 tablet (10 mg total) by mouth daily. 90 tablet 0  . aspirin 81 MG EC tablet Take 1 tablet (81 mg total) by mouth daily at 6 (six) AM. 90 tablet 0  . atorvastatin (LIPITOR) 40 MG tablet Take 1 tablet (40 mg total) by mouth daily. 90 tablet 0  . ferrous sulfate 325 (65 FE) MG tablet Take 1 tablet (325 mg total) by mouth 2 (two) times daily with a meal. 180 tablet 0  . pantoprazole (PROTONIX) 40 MG tablet Take 1 tablet (40 mg total) by mouth daily. 90 tablet 0  . vitamin B-12 1000 MCG tablet Take 1 tablet (1,000 mcg total) by mouth daily. 90 tablet 0   No current facility-administered medications on file prior to visit.    ALLERGIES: No Known Allergies  FAMILY HISTORY: Family History  Problem Relation Age of Onset  . Diabetes Mother   . Hypertension Mother   . Cancer Father   . Alcohol abuse Sister     SOCIAL HISTORY: Social History   Socioeconomic History  . Marital status:  Legally Separated    Spouse name: Not on file  . Number of children: 3  . Years of education: Not on file  . Highest education level: Not on file  Occupational History  . Occupation: unemployed  Tobacco Use  . Smoking status: Current Some Day Smoker    Packs/day: 0.70    Types: Cigarettes    Last attempt to quit: 05/08/2019    Years since quitting: 0.5  . Smokeless tobacco: Never Used  . Tobacco comment: only 2 cigarettes since surgery a month ago  Vaping Use  . Vaping Use: Never used  Substance and Sexual Activity  . Alcohol use: Not Currently    Alcohol/week: 3.0 standard drinks     Types: 3 Cans of beer per week    Comment: have not been drinking since surgery  . Drug use: Yes    Frequency: 3.0 times per week    Types: Marijuana    Comment: unsure exactly of use  . Sexual activity: Not Currently    Birth control/protection: None  Other Topics Concern  . Not on file  Social History Narrative   Right handed   Drinks caffeine   One story home   Social Determinants of Health   Financial Resource Strain:   . Difficulty of Paying Living Expenses: Not on file  Food Insecurity:   . Worried About Programme researcher, broadcasting/film/video in the Last Year: Not on file  . Ran Out of Food in the Last Year: Not on file  Transportation Needs: Unknown  . Lack of Transportation (Medical): No  . Lack of Transportation (Non-Medical): Not on file  Physical Activity: Unknown  . Days of Exercise per Week: 0 days  . Minutes of Exercise per Session: Not on file  Stress:   . Feeling of Stress : Not on file  Social Connections:   . Frequency of Communication with Friends and Family: Not on file  . Frequency of Social Gatherings with Friends and Family: Not on file  . Attends Religious Services: Not on file  . Active Member of Clubs or Organizations: Not on file  . Attends Banker Meetings: Not on file  . Marital Status: Not on file  Intimate Partner Violence: Unknown  . Fear of Current or Ex-Partner: No  . Emotionally Abused: Not on file  . Physically Abused: Not on file  . Sexually Abused: Not on file     Objective:  *** General: No acute distress.  Patient appears well-groomed.   Head:  Normocephalic/atraumatic Eyes:  Fundi examined but not visualized Neck: supple, no paraspinal tenderness, full range of motion Heart:  Regular rate and rhythm Lungs:  Clear to auscultation bilaterally Back: No paraspinal tenderness Neurological Exam: alert and oriented to person, place, and time. Attention span and concentration intact, recent and remote memory intact, fund of knowledge  intact.  Speech fluent and not dysarthric, language intact.  CN II-XII intact. Bulk and tone normal, muscle strength 5/5 throughout.  Sensation to light touch, temperature and vibration intact.  Deep tendon reflexes 2+ throughout, toes downgoing.  Finger to nose and heel to shin testing intact.  Gait normal, Romberg negative.   Assessment/Plan:   1.  Multiple infarcts in left MCA territory secondary to left ICA stenosis s/p CEA 2.  Neuralgia secondary to CEA 3.  Hypertension 4.  Hyperlipidemia 5.  Tobacco use disorder  ***  Shon Millet, DO  CC: ***

## 2019-11-30 ENCOUNTER — Ambulatory Visit: Payer: Medicaid Other | Admitting: Neurology

## 2019-12-22 ENCOUNTER — Ambulatory Visit (INDEPENDENT_AMBULATORY_CARE_PROVIDER_SITE_OTHER): Payer: Medicaid Other | Admitting: Vascular Surgery

## 2019-12-22 ENCOUNTER — Ambulatory Visit (INDEPENDENT_AMBULATORY_CARE_PROVIDER_SITE_OTHER): Payer: Medicaid Other

## 2020-01-27 ENCOUNTER — Other Ambulatory Visit: Payer: Self-pay | Admitting: Gerontology

## 2020-02-01 ENCOUNTER — Other Ambulatory Visit: Payer: Self-pay | Admitting: Gerontology

## 2020-02-02 ENCOUNTER — Other Ambulatory Visit: Payer: Self-pay | Admitting: Gerontology

## 2020-02-09 ENCOUNTER — Ambulatory Visit: Payer: Medicaid Other | Admitting: Gerontology

## 2020-02-13 ENCOUNTER — Other Ambulatory Visit: Payer: Medicaid Other

## 2020-02-14 ENCOUNTER — Telehealth: Payer: Medicaid Other | Admitting: Gerontology

## 2020-02-15 ENCOUNTER — Telehealth: Payer: Self-pay | Admitting: Gerontology

## 2020-02-15 NOTE — Telephone Encounter (Signed)
lmom to inform pt of appt 2/16 at 10 am per staff msg

## 2020-02-16 ENCOUNTER — Other Ambulatory Visit: Payer: Self-pay | Admitting: Gerontology

## 2020-02-29 ENCOUNTER — Ambulatory Visit: Payer: Medicaid Other | Admitting: Gerontology

## 2020-02-29 ENCOUNTER — Encounter: Payer: Self-pay | Admitting: Gerontology

## 2020-02-29 ENCOUNTER — Other Ambulatory Visit: Payer: Self-pay | Admitting: Gerontology

## 2020-02-29 ENCOUNTER — Other Ambulatory Visit: Payer: Self-pay

## 2020-02-29 ENCOUNTER — Other Ambulatory Visit: Payer: Medicaid Other

## 2020-02-29 VITALS — BP 161/99 | HR 82 | Temp 97.0°F | Resp 17 | Wt 239.8 lb

## 2020-02-29 DIAGNOSIS — J329 Chronic sinusitis, unspecified: Secondary | ICD-10-CM | POA: Insufficient documentation

## 2020-02-29 DIAGNOSIS — I10 Essential (primary) hypertension: Secondary | ICD-10-CM

## 2020-02-29 MED ORDER — FLUTICASONE PROPIONATE 50 MCG/ACT NA SUSP: 2.0000 | Freq: Every day | NASAL | 1 refills | Status: DC

## 2020-02-29 MED ORDER — AMLODIPINE BESYLATE 10 MG PO TABS: 10.0000 mg | ORAL_TABLET | Freq: Every day | ORAL | 1 refills | Status: DC

## 2020-02-29 NOTE — Progress Notes (Signed)
Established Patient Office Visit  Subjective:  Patient ID: Kimberly Mcdonald, female    DOB: 1965-06-29  Age: 55 y.o. MRN: [id number]  CC: No chief complaint on file.   HPI Elisha Ponder who has a history of CVA, Arthritis, hyperlipidemia and hypertension presents for follow up of hypertension and c/o sinus pressure. She states that she was out of her Amlodipine for 1 month. She checks her blood pressure daily and it has been less than 150/90. She denies chest pain, palpitation, dizziness, headache, vision changes and peripheral edema. Currently, she c/o sinus pressure and rhinorrhea that started 2 days ago. She denies sick contacts, sore throat, headache, cough, fever and chills. She states that taking otc sudafed relieves symptoms. She has not had Mammogram, Pap smear and Colonoscopy done. Overall, she states that she's doing well and offers no further complaint.  Past Medical History:  Diagnosis Date  . Allergy    seasonal  . Anemia due to blood loss 05/08/2019  . Arthritis    knees and back  . Cerebrovascular accident (CVA) ([place])   . HTN (hypertension)   . Hyperlipidemia   . Hypertension   . Substance abuse ([place])    Marijuana smoker x35 yrs    Past Surgical History:  Procedure Laterality Date  . ENDARTERECTOMY Left 05/13/2019   Procedure: ENDARTERECTOMY CAROTID;  Surgeon: Katha Cabal, MD;  Location: [facility] ORS;  Service: Vascular;  Laterality: Left;  . TUBAL LIGATION      Family History  Problem Relation Age of Onset  . Diabetes Mother   . Hypertension Mother   . Cancer Father   . Alcohol abuse Sister     Social History   Socioeconomic History  . Marital status: Legally Separated    Spouse name: Not on file  . Number of children: 3  . Years of education: Not on file  . Highest education level: Not on file  Occupational History  . Occupation: unemployed  Tobacco Use  . Smoking status: Current Some Day Smoker    Packs/day: 0.70    Types:  Cigarettes    Last attempt to quit: 05/08/2019    Years since quitting: 0.8  . Smokeless tobacco: Never Used  . Tobacco comment: only 2 cigarettes since surgery a month ago  Vaping Use  . Vaping Use: Never used  Substance and Sexual Activity  . Alcohol use: Not Currently    Alcohol/week: 3.0 standard drinks    Types: 3 Cans of beer per week    Comment: have not been drinking since surgery  . Drug use: Yes    Frequency: 3.0 times per week    Types: Marijuana    Comment: unsure exactly of use  . Sexual activity: Not Currently    Birth control/protection: None  Other Topics Concern  . Not on file  Social History Narrative   Right handed   Drinks caffeine   One story home   Social Determinants of Health   Financial Resource Strain: Not on file  Food Insecurity: Not on file  Transportation Needs: Unknown  . Lack of Transportation (Medical): No  . Lack of Transportation (Non-Medical): Not on file  Physical Activity: Unknown  . Days of Exercise per Week: 0 days  . Minutes of Exercise per Session: Not on file  Stress: Not on file  Social Connections: Not on file  Intimate Partner Violence: Unknown  . Fear of Current or Ex-Partner: No  . Emotionally Abused: Not on file  . Physically  Abused: Not on file  . Sexually Abused: Not on file    Outpatient Medications Prior to Visit  Medication Sig Dispense Refill  . acetaminophen (TYLENOL) 325 MG tablet Take 500 mg by mouth every 6 (six) hours as needed. PATIENT TAKES ONCE DAILY.    Marland Kitchen aspirin 81 MG EC tablet TAKE ONE TABLET BY MOUTH EVERY DAY AT 6 IN THE MORNING 90 tablet 0  . atorvastatin (LIPITOR) 40 MG tablet TAKE ONE TABLET BY MOUTH EVERY DAY 90 tablet 0  . FEROSUL 325 (65 Fe) MG tablet TAKE ONE TABLET BY MOUTH 2 TIMES A DAY WITH MEALS 180 tablet 0  . amLODipine (NORVASC) 10 MG tablet TAKE ONE TABLET BY MOUTH EVERY DAY 30 tablet 0  . pantoprazole (PROTONIX) 40 MG tablet Take 1 tablet (40 mg total) by mouth daily. 90 tablet 0    No facility-administered medications prior to visit.    No Known Allergies  ROS Review of Systems  Constitutional: Negative.   HENT: Positive for rhinorrhea, sinus pressure and sinus pain. Negative for sore throat.   Eyes: Negative.   Respiratory: Negative.   Cardiovascular: Negative.   Neurological: Negative.   Psychiatric/Behavioral: Negative.       Objective:    Physical Exam HENT:     Head: Normocephalic.     Nose:     Right Sinus: Maxillary sinus tenderness present. No frontal sinus tenderness.     Left Sinus: Maxillary sinus tenderness present. No frontal sinus tenderness.  Cardiovascular:     Rate and Rhythm: Normal rate.     Pulses: Normal pulses.     Heart sounds: Normal heart sounds.  Pulmonary:     Effort: Pulmonary effort is normal.     Breath sounds: Normal breath sounds.  Skin:    General: Skin is warm.  Neurological:     General: No focal deficit present.     Mental Status: She is alert and oriented to person, place, and time. Mental status is at baseline.  Psychiatric:        Mood and Affect: Mood normal.        Behavior: Behavior normal.        Thought Content: Thought content normal.        Judgment: Judgment normal.     BP (!) 161/99 (BP Location: Left Arm, Patient Position: Sitting, Cuff Size: Large)   Pulse 82   Temp (!) 97 F (36.1 C)   Resp 17   Wt 239 lb 12.8 oz (108.8 kg)   LMP 10/01/2016 (Approximate)   SpO2 94%   BMI 42.48 kg/m  Wt Readings from Last 3 Encounters:  02/29/20 239 lb 12.8 oz (108.8 kg)  07/07/19 213 lb (96.6 kg)  06/16/19 213 lb 6.4 oz (96.8 kg)   Encouraged weight loss  Health Maintenance Due  Topic Date Due  . Hepatitis C Screening  Never done  . COVID-19 Vaccine (1) Never done  . TETANUS/TDAP  Never done  . PAP SMEAR-Modifier  Never done  . COLONOSCOPY (Pts 45-54yr Insurance coverage will need to be confirmed)  Never done  . MAMMOGRAM  Never done  . INFLUENZA VACCINE  Never done    There are no  preventive care reminders to display for this patient.  Lab Results  Component Value Date   TSH 1.700 09/03/2017   Lab Results  Component Value Date   WBC 7.0 02/29/2020   HGB 12.9 02/29/2020   HCT 41.2 02/29/2020   MCV 89 02/29/2020  PLT 388 02/29/2020   Lab Results  Component Value Date   NA 141 02/29/2020   K 3.7 02/29/2020   CO2 22 02/29/2020   GLUCOSE 93 02/29/2020   BUN 8 02/29/2020   CREATININE 0.62 02/29/2020   BILITOT 1.0 02/29/2020   ALKPHOS 128 (H) 02/29/2020   AST 13 02/29/2020   ALT 8 02/29/2020   PROT 7.5 02/29/2020   ALBUMIN 4.2 02/29/2020   CALCIUM 9.3 02/29/2020   ANIONGAP 7 05/14/2019   Lab Results  Component Value Date   CHOL 162 06/16/2019   Lab Results  Component Value Date   HDL 53 06/16/2019   Lab Results  Component Value Date   LDLCALC 86 06/16/2019   Lab Results  Component Value Date   TRIG 131 06/16/2019   Lab Results  Component Value Date   CHOLHDL 3.1 06/16/2019   Lab Results  Component Value Date   HGBA1C 5.2 05/09/2019      Assessment & Plan:    1. Primary hypertension - Her blood pressure was elevated, her goal should be less than 140/90. She will continue on her current medication, check, record and bring blood pressure to follow up visit. Continue on DASH diet and exercise as tolerated. - Urine Microalbumin w/creat. ratio; Future - amLODipine (NORVASC) 10 MG tablet; Take 1 tablet (10 mg total) by mouth daily.  Dispense: 90 tablet; Refill: 1 - Comp Met (CMET); Future - Comp Met (CMET) - Urine Microalbumin w/creat. ratio  2. Sinusitis, unspecified chronicity, unspecified location - She has maxillary sinus tenderness, will continue on Flonase for 3 days. Educated on medication side effects and advised to notify clinic. She was advised to go to the ED or notify clinic for worsening symptoms. She ws advised not to use Sudafed due to hypertension. - CBC w/Diff; Future - fluticasone (FLONASE) 50 MCG/ACT nasal spray;  Place 2 sprays into both nostrils daily.  Dispense: 9.9 mL; Refill: 1 - CBC w/Diff    Follow-up: Return in about 1 month (around 03/28/2020), or if symptoms worsen or fail to improve.    Lanaysia Fritchman Jerold Coombe, NP

## 2020-02-29 NOTE — Patient Instructions (Signed)

## 2020-03-01 LAB — CBC WITH DIFFERENTIAL/PLATELET
Basophils Absolute: 0.1 10*3/uL (ref 0.0–0.2)
Basos: 1 %
EOS (ABSOLUTE): 0 10*3/uL (ref 0.0–0.4)
Eos: 1 %
Hematocrit: 41.2 % (ref 34.0–46.6)
Hemoglobin: 12.9 g/dL (ref 11.1–15.9)
Immature Grans (Abs): 0 10*3/uL (ref 0.0–0.1)
Immature Granulocytes: 0 %
Lymphocytes Absolute: 2 10*3/uL (ref 0.7–3.1)
Lymphs: 29 %
MCH: 28 pg (ref 26.6–33.0)
MCHC: 31.3 g/dL — ABNORMAL LOW (ref 31.5–35.7)
MCV: 89 fL (ref 79–97)
Monocytes Absolute: 0.6 10*3/uL (ref 0.1–0.9)
Monocytes: 9 %
Neutrophils Absolute: 4.3 10*3/uL (ref 1.4–7.0)
Neutrophils: 60 %
Platelets: 388 10*3/uL (ref 150–450)
RBC: 4.61 x10E6/uL (ref 3.77–5.28)
RDW: 12.7 % (ref 11.7–15.4)
WBC: 7 10*3/uL (ref 3.4–10.8)

## 2020-03-01 LAB — COMPREHENSIVE METABOLIC PANEL
ALT: 8 IU/L (ref 0–32)
AST: 13 IU/L (ref 0–40)
Albumin/Globulin Ratio: 1.3 (ref 1.2–2.2)
Albumin: 4.2 g/dL (ref 3.8–4.9)
Alkaline Phosphatase: 128 IU/L — ABNORMAL HIGH (ref 44–121)
BUN/Creatinine Ratio: 13 (ref 9–23)
BUN: 8 mg/dL (ref 6–24)
Bilirubin Total: 1 mg/dL (ref 0.0–1.2)
CO2: 22 mmol/L (ref 20–29)
Calcium: 9.3 mg/dL (ref 8.7–10.2)
Chloride: 104 mmol/L (ref 96–106)
Creatinine, Ser: 0.62 mg/dL (ref 0.57–1.00)
GFR calc Af Amer: 117 mL/min/{1.73_m2} (ref >59)
GFR calc non Af Amer: 101 mL/min/{1.73_m2} (ref >59)
Globulin, Total: 3.3 g/dL (ref 1.5–4.5)
Glucose: 93 mg/dL (ref 65–99)
Potassium: 3.7 mmol/L (ref 3.5–5.2)
Sodium: 141 mmol/L (ref 134–144)
Total Protein: 7.5 g/dL (ref 6.0–8.5)

## 2020-03-01 LAB — MICROALBUMIN / CREATININE URINE RATIO
Creatinine, Urine: 255.8 mg/dL
Microalb/Creat Ratio: 8 mg/g creat (ref 0–29)
Microalbumin, Urine: 21.7 ug/mL

## 2020-03-08 ENCOUNTER — Telehealth: Payer: Self-pay | Admitting: Pharmacist

## 2020-03-08 NOTE — Telephone Encounter (Signed)
Provided 2022 proof of income. Approved to receive medication assistance at [facility] until time for re-certification in 5364, and as long as eligibility criteria continues to be met.   [place]

## 2020-03-28 ENCOUNTER — Encounter: Payer: Self-pay | Admitting: Gerontology

## 2020-03-28 ENCOUNTER — Ambulatory Visit: Payer: Medicaid Other | Admitting: Gerontology

## 2020-03-28 ENCOUNTER — Other Ambulatory Visit: Payer: Self-pay | Admitting: Gerontology

## 2020-03-28 ENCOUNTER — Other Ambulatory Visit: Payer: Self-pay

## 2020-03-28 VITALS — BP 116/77 | HR 97 | Temp 97.9°F | Resp 16 | Wt 242.6 lb

## 2020-03-28 DIAGNOSIS — E785 Hyperlipidemia, unspecified: Secondary | ICD-10-CM

## 2020-03-28 DIAGNOSIS — Z8379 Family history of other diseases of the digestive system: Secondary | ICD-10-CM

## 2020-03-28 DIAGNOSIS — M25562 Pain in left knee: Secondary | ICD-10-CM

## 2020-03-28 DIAGNOSIS — M25561 Pain in right knee: Secondary | ICD-10-CM | POA: Insufficient documentation

## 2020-03-28 DIAGNOSIS — Z720 Tobacco use: Secondary | ICD-10-CM

## 2020-03-28 DIAGNOSIS — E1169 Type 2 diabetes mellitus with other specified complication: Secondary | ICD-10-CM

## 2020-03-28 DIAGNOSIS — G8929 Other chronic pain: Secondary | ICD-10-CM

## 2020-03-28 DIAGNOSIS — I10 Essential (primary) hypertension: Secondary | ICD-10-CM

## 2020-03-28 MED ORDER — PANTOPRAZOLE SODIUM 40 MG PO TBEC: 40.0000 mg | DELAYED_RELEASE_TABLET | Freq: Every day | ORAL | 0 refills | Status: DC

## 2020-03-28 MED ORDER — ATORVASTATIN CALCIUM 40 MG PO TABS: 40.0000 mg | ORAL_TABLET | Freq: Every day | ORAL | 0 refills | Status: DC

## 2020-03-28 NOTE — Progress Notes (Signed)
Established Patient Office Visit  Subjective:  Patient ID: Kimberly Mcdonald, female    DOB: 05-17-1965  Age: 55 y.o. MRN: [id number]  CC: No chief complaint on file.   HPI Kimberly Mcdonald 55 y/o female who has a history of CVA, Arthritis, hyperlipidemia and hypertension presents for routine follow up visit and lab review. She was seen during her previous visit for c/o sinusitis and elevated blood pressure. Currently, she states that her sinusitis has resolved. She's compliant with her medications and adheres to DASH diet. She states that she checks her blood pressure at home and it's usually less than 140/90. She c/o worsening pain to bilateral knee that has been going on for more than 4 years. She reports seeing [facility] Orthopedic Doctor Dr Justice Rocher on 09/16/2017, and he suggested Total knee replacement. Overall, she states that she's doing well and offers no further complaint.  Past Medical History:  Diagnosis Date  . Allergy    seasonal  . Anemia due to blood loss 05/08/2019  . Arthritis    knees and back  . Cerebrovascular accident (CVA) (HCC)   . HTN (hypertension)   . Hyperlipidemia   . Hypertension   . 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 ENDARTERECTOMY Left 05/13/2019   Procedure: ENDARTERECTOMY CAROTID;  Surgeon: Renford Dills, MD;  Location: [facility] ORS;  Service: Vascular;  Laterality: Left;  . TUBAL LIGATION      Family History  Problem Relation Age of Onset  . Diabetes Mother   . Hypertension Mother   . Cancer Father   . Alcohol abuse Sister     Social History   Socioeconomic History  . Marital status: Legally Separated    Spouse name: Not on file  . Number of children: 3  . Years of education: Not on file  . Highest education level: Not on file  Occupational History  . Occupation: unemployed  Tobacco Use  . Smoking status: Current Some Day Smoker    Packs/day: 0.70    Types:  Cigarettes    Last attempt to quit: 05/08/2019    Years since quitting: 0.8  . Smokeless tobacco: Never Used  . Tobacco comment: only 2 cigarettes since surgery a month ago  Vaping Use  . Vaping Use: Never used  Substance and Sexual Activity  . Alcohol use: Not Currently    Alcohol/week: 3.0 standard drinks    Types: 3 Cans of beer per week    Comment: have not been drinking since surgery  . Drug use: Yes    Frequency: 3.0 times per week    Types: Marijuana    Comment: unsure exactly of use  . Sexual activity: Not Currently    Birth control/protection: None  Other Topics Concern  . Not on file  Social History Narrative   Right handed   Drinks caffeine   One story home   Social Determinants of Health   Financial Resource Strain: Not on file  Food Insecurity: Not on file  Transportation Needs: Unknown  . Lack of Transportation (Medical): No  . Lack of Transportation (Non-Medical): Not on file  Physical Activity: Unknown  . Days of Exercise per Week: 0 days  . Minutes of Exercise per Session: Not on file  Stress: Not on file  Social Connections: Not on file  Intimate Partner Violence: Unknown  . Fear of Current or Ex-Partner: No  . Emotionally Abused: Not  on file  . Physically Abused: Not on file  . Sexually Abused: Not on file    Outpatient Medications Prior to Visit  Medication Sig Dispense Refill  . amLODipine (NORVASC) 10 MG tablet Take 1 tablet (10 mg total) by mouth daily. 90 tablet 1  . aspirin 81 MG EC tablet TAKE ONE TABLET BY MOUTH EVERY DAY AT 6 IN THE MORNING 90 tablet 0  . atorvastatin (LIPITOR) 40 MG tablet TAKE ONE TABLET BY MOUTH EVERY DAY 90 tablet 0  . acetaminophen (TYLENOL) 325 MG tablet Take 500 mg by mouth every 6 (six) hours as needed. PATIENT TAKES ONCE DAILY.    . fluticasone (FLONASE) 50 MCG/ACT nasal spray Place 2 sprays into both nostrils daily. (Patient not taking: Reported on 03/28/2020) 9.9 mL 1  . FEROSUL 325 (65 Fe) MG tablet TAKE ONE  TABLET BY MOUTH 2 TIMES A DAY WITH MEALS (Patient not taking: Reported on 03/28/2020) 180 tablet 0  . pantoprazole (PROTONIX) 40 MG tablet Take 1 tablet (40 mg total) by mouth daily. 90 tablet 0   No facility-administered medications prior to visit.    No Known Allergies  ROS Review of Systems  Constitutional: Negative.   Respiratory: Negative.   Cardiovascular: Negative.   Musculoskeletal: Positive for arthralgias (bilateral knee pain).  Skin: Negative.   Neurological: Negative.       Objective:    Physical Exam Eyes:     Extraocular Movements: Extraocular movements intact.     Conjunctiva/sclera: Conjunctivae normal.     Pupils: Pupils are equal, round, and reactive to light.  Cardiovascular:     Rate and Rhythm: Normal rate and regular rhythm.     Pulses: Normal pulses.     Heart sounds: Normal heart sounds.  Pulmonary:     Effort: Pulmonary effort is normal.  Musculoskeletal:        General: Deformity (antalgic gait.) present.  Skin:    General: Skin is warm.  Neurological:     General: No focal deficit present.     Mental Status: She is alert and oriented to person, place, and time. Mental status is at baseline.  Psychiatric:        Mood and Affect: Mood normal.        Behavior: Behavior normal.        Thought Content: Thought content normal.        Judgment: Judgment normal.     BP 116/77 (BP Location: Left Arm, Patient Position: Sitting, Cuff Size: Large)   Pulse 97   Temp 97.9 F (36.6 C)   Resp 16   Wt 242 lb 9.6 oz (110 kg)   LMP 10/01/2016 (Approximate)   SpO2 95%   BMI 42.97 kg/m  Wt Readings from Last 3 Encounters:  03/28/20 242 lb 9.6 oz (110 kg)  02/29/20 239 lb 12.8 oz (108.8 kg)  07/07/19 213 lb (96.6 kg)   Weight loss encouraged.  Health Maintenance Due  Topic Date Due  . Hepatitis C Screening  Never done  . COVID-19 Vaccine (1) Never done  . TETANUS/TDAP  Never done  . PAP SMEAR-Modifier  Never done  . COLONOSCOPY (Pts 45-75yrs  Insurance coverage will need to be confirmed)  Never done  . MAMMOGRAM  Never done  . INFLUENZA VACCINE  Never done    There are no preventive care reminders to display for this patient.  Lab Results  Component Value Date   TSH 1.700 09/03/2017   Lab Results  Component Value Date  WBC 7.0 02/29/2020   HGB 12.9 02/29/2020   HCT 41.2 02/29/2020   MCV 89 02/29/2020   PLT 388 02/29/2020   Lab Results  Component Value Date   NA 141 02/29/2020   K 3.7 02/29/2020   CO2 22 02/29/2020   GLUCOSE 93 02/29/2020   BUN 8 02/29/2020   CREATININE 0.62 02/29/2020   BILITOT 1.0 02/29/2020   ALKPHOS 128 (H) 02/29/2020   AST 13 02/29/2020   ALT 8 02/29/2020   PROT 7.5 02/29/2020   ALBUMIN 4.2 02/29/2020   CALCIUM 9.3 02/29/2020   ANIONGAP 7 05/14/2019   Lab Results  Component Value Date   CHOL 162 06/16/2019   Lab Results  Component Value Date   HDL 53 06/16/2019   Lab Results  Component Value Date   LDLCALC 86 06/16/2019   Lab Results  Component Value Date   TRIG 131 06/16/2019   Lab Results  Component Value Date   CHOLHDL 3.1 06/16/2019   Lab Results  Component Value Date   HGBA1C 5.2 05/09/2019      Assessment & Plan:     1. Tobacco abuse - She was encouraged on smoking cessation, provided with [facility] Quit line information. - CT CHEST LUNG CA SCREEN LOW DOSE W/O CM; Future  2. Primary hypertension - Her blood pressure is under control, she will continue on current treatment regimen and DASH diet.  3. Hyperlipidemia associated with type 2 diabetes mellitus (HCC) - She will continue on current treatment regimen, low fat/cholesterol diet. - atorvastatin (LIPITOR) 40 MG tablet; Take 1 tablet (40 mg total) by mouth daily.  Dispense: 90 tablet; Refill: 0  4. Family history of GERD - Her acid reflux is under control, and will continue on current treatment regimen. - pantoprazole (PROTONIX) 40 MG tablet; Take 1 tablet (40 mg total) by mouth daily.  Dispense: 90  tablet; Refill: 0  5. Chronic pain of both knees - She was encouraged to complete [facility] financial application for  - Ambulatory referral to Orthopedic Surgery  Follow-up: Return in about 29 days (around 04/26/2020), or if symptoms worsen or fail to improve.    Melaney Tellefsen Trellis Paganini, NP

## 2020-03-28 NOTE — Patient Instructions (Signed)

## 2020-04-04 ENCOUNTER — Emergency Department: Payer: Self-pay

## 2020-04-04 ENCOUNTER — Inpatient Hospital Stay
Admission: EM | Admit: 2020-04-04 | Discharge: 2020-04-06 | DRG: 871 | Disposition: A | Discharge status: Home / Self Care | Payer: Self-pay | Attending: Internal Medicine | Admitting: Internal Medicine

## 2020-04-04 ENCOUNTER — Other Ambulatory Visit: Payer: Self-pay

## 2020-04-04 DIAGNOSIS — Z8673 Personal history of transient ischemic attack (TIA), and cerebral infarction without residual deficits: Secondary | ICD-10-CM

## 2020-04-04 DIAGNOSIS — K922 Gastrointestinal hemorrhage, unspecified: Secondary | ICD-10-CM | POA: Diagnosis present

## 2020-04-04 DIAGNOSIS — Z79899 Other long term (current) drug therapy: Secondary | ICD-10-CM

## 2020-04-04 DIAGNOSIS — Z6841 Body Mass Index (BMI) 40.0 and over, adult: Secondary | ICD-10-CM

## 2020-04-04 DIAGNOSIS — F17210 Nicotine dependence, cigarettes, uncomplicated: Secondary | ICD-10-CM | POA: Diagnosis present

## 2020-04-04 DIAGNOSIS — D509 Iron deficiency anemia, unspecified: Secondary | ICD-10-CM | POA: Diagnosis present

## 2020-04-04 DIAGNOSIS — I10 Essential (primary) hypertension: Secondary | ICD-10-CM | POA: Diagnosis present

## 2020-04-04 DIAGNOSIS — K219 Gastro-esophageal reflux disease without esophagitis: Secondary | ICD-10-CM | POA: Diagnosis present

## 2020-04-04 DIAGNOSIS — Z720 Tobacco use: Secondary | ICD-10-CM | POA: Diagnosis present

## 2020-04-04 DIAGNOSIS — K640 First degree hemorrhoids: Secondary | ICD-10-CM | POA: Diagnosis present

## 2020-04-04 DIAGNOSIS — I639 Cerebral infarction, unspecified: Secondary | ICD-10-CM | POA: Diagnosis present

## 2020-04-04 DIAGNOSIS — I2510 Atherosclerotic heart disease of native coronary artery without angina pectoris: Secondary | ICD-10-CM | POA: Diagnosis present

## 2020-04-04 DIAGNOSIS — E785 Hyperlipidemia, unspecified: Secondary | ICD-10-CM | POA: Diagnosis present

## 2020-04-04 DIAGNOSIS — K551 Chronic vascular disorders of intestine: Secondary | ICD-10-CM | POA: Diagnosis present

## 2020-04-04 DIAGNOSIS — M170 Bilateral primary osteoarthritis of knee: Secondary | ICD-10-CM | POA: Diagnosis present

## 2020-04-04 DIAGNOSIS — Z8249 Family history of ischemic heart disease and other diseases of the circulatory system: Secondary | ICD-10-CM

## 2020-04-04 DIAGNOSIS — K254 Chronic or unspecified gastric ulcer with hemorrhage: Secondary | ICD-10-CM | POA: Diagnosis present

## 2020-04-04 DIAGNOSIS — I6529 Occlusion and stenosis of unspecified carotid artery: Secondary | ICD-10-CM | POA: Diagnosis present

## 2020-04-04 DIAGNOSIS — Z20822 Contact with and (suspected) exposure to covid-19: Secondary | ICD-10-CM | POA: Diagnosis present

## 2020-04-04 DIAGNOSIS — K449 Diaphragmatic hernia without obstruction or gangrene: Secondary | ICD-10-CM | POA: Diagnosis present

## 2020-04-04 DIAGNOSIS — A411 Sepsis due to other specified staphylococcus: Principal | ICD-10-CM | POA: Diagnosis present

## 2020-04-04 DIAGNOSIS — D62 Acute posthemorrhagic anemia: Secondary | ICD-10-CM | POA: Diagnosis present

## 2020-04-04 DIAGNOSIS — K2970 Gastritis, unspecified, without bleeding: Secondary | ICD-10-CM | POA: Diagnosis present

## 2020-04-04 DIAGNOSIS — M479 Spondylosis, unspecified: Secondary | ICD-10-CM | POA: Diagnosis present

## 2020-04-04 DIAGNOSIS — D649 Anemia, unspecified: Secondary | ICD-10-CM | POA: Diagnosis present

## 2020-04-04 DIAGNOSIS — Z7982 Long term (current) use of aspirin: Secondary | ICD-10-CM

## 2020-04-04 DIAGNOSIS — K5730 Diverticulosis of large intestine without perforation or abscess without bleeding: Secondary | ICD-10-CM | POA: Diagnosis present

## 2020-04-04 DIAGNOSIS — E66813 Obesity, class 3: Secondary | ICD-10-CM | POA: Diagnosis present

## 2020-04-04 LAB — COMPREHENSIVE METABOLIC PANEL
ALT: 8 U/L (ref 0–44)
AST: 16 U/L (ref 15–41)
Albumin: 3.5 g/dL (ref 3.5–5.0)
Alkaline Phosphatase: 74 U/L (ref 38–126)
Anion gap: 11 (ref 5–15)
BUN: 66 mg/dL — ABNORMAL HIGH (ref 6–20)
CO2: 16 mmol/L — ABNORMAL LOW (ref 22–32)
Calcium: 8.7 mg/dL — ABNORMAL LOW (ref 8.9–10.3)
Chloride: 116 mmol/L — ABNORMAL HIGH (ref 98–111)
Creatinine, Ser: 0.87 mg/dL (ref 0.44–1.00)
GFR, Estimated: >60 mL/min (ref >60)
Glucose, Bld: 159 mg/dL — ABNORMAL HIGH (ref 70–99)
Potassium: 3.8 mmol/L (ref 3.5–5.1)
Sodium: 143 mmol/L (ref 135–145)
Total Bilirubin: 0.7 mg/dL (ref 0.3–1.2)
Total Protein: 6.9 g/dL (ref 6.5–8.1)

## 2020-04-04 LAB — CBC WITH DIFFERENTIAL/PLATELET
Abs Immature Granulocytes: 0.24 10*3/uL — ABNORMAL HIGH (ref 0.00–0.07)
Abs Immature Granulocytes: 0.14 10*3/uL — ABNORMAL HIGH (ref 0.00–0.07)
Basophils Absolute: 0.1 10*3/uL (ref 0.0–0.1)
Basophils Absolute: 0.1 10*3/uL (ref 0.0–0.1)
Basophils Relative: 0 %
Basophils Relative: 0 %
Eosinophils Absolute: 0 10*3/uL (ref 0.0–0.5)
Eosinophils Absolute: 0 10*3/uL (ref 0.0–0.5)
Eosinophils Relative: 0 %
Eosinophils Relative: 0 %
HCT: 26.5 % — ABNORMAL LOW (ref 36.0–46.0)
HCT: 29.9 % — ABNORMAL LOW (ref 36.0–46.0)
Hemoglobin: 8.3 g/dL — ABNORMAL LOW (ref 12.0–15.0)
Hemoglobin: 10 g/dL — ABNORMAL LOW (ref 12.0–15.0)
Immature Granulocytes: 1 %
Immature Granulocytes: 1 %
Lymphocytes Relative: 11 %
Lymphocytes Relative: 18 %
Lymphs Abs: 2.9 10*3/uL (ref 0.7–4.0)
Lymphs Abs: 4.1 10*3/uL — ABNORMAL HIGH (ref 0.7–4.0)
MCH: 29.4 pg (ref 26.0–34.0)
MCH: 30.2 pg (ref 26.0–34.0)
MCHC: 31.3 g/dL (ref 30.0–36.0)
MCHC: 33.4 g/dL (ref 30.0–36.0)
MCV: 94 fL (ref 80.0–100.0)
MCV: 90.3 fL (ref 80.0–100.0)
Monocytes Absolute: 1.5 10*3/uL — ABNORMAL HIGH (ref 0.1–1.0)
Monocytes Absolute: 1.6 10*3/uL — ABNORMAL HIGH (ref 0.1–1.0)
Monocytes Relative: 6 %
Monocytes Relative: 7 %
Neutro Abs: 22.1 10*3/uL — ABNORMAL HIGH (ref 1.7–7.7)
Neutro Abs: 16.6 10*3/uL — ABNORMAL HIGH (ref 1.7–7.7)
Neutrophils Relative %: 82 %
Neutrophils Relative %: 74 %
Platelets: 325 10*3/uL (ref 150–400)
Platelets: 254 10*3/uL (ref 150–400)
RBC: 2.82 MIL/uL — ABNORMAL LOW (ref 3.87–5.11)
RBC: 3.31 MIL/uL — ABNORMAL LOW (ref 3.87–5.11)
RDW: 14.1 % (ref 11.5–15.5)
RDW: 14.2 % (ref 11.5–15.5)
Smear Review: NORMAL
WBC: 26.7 10*3/uL — ABNORMAL HIGH (ref 4.0–10.5)
WBC: 22.6 10*3/uL — ABNORMAL HIGH (ref 4.0–10.5)
nRBC: 0.1 % (ref 0.0–0.2)
nRBC: 0.1 % (ref 0.0–0.2)

## 2020-04-04 LAB — URINALYSIS, COMPLETE (UACMP) WITH MICROSCOPIC
Bacteria, UA: NONE SEEN
Bilirubin Urine: NEGATIVE
Glucose, UA: NEGATIVE mg/dL
Hgb urine dipstick: NEGATIVE
Ketones, ur: NEGATIVE mg/dL
Leukocytes,Ua: NEGATIVE
Nitrite: NEGATIVE
Protein, ur: NEGATIVE mg/dL
Specific Gravity, Urine: 1.023 (ref 1.005–1.030)
pH: 6 (ref 5.0–8.0)

## 2020-04-04 LAB — LIPASE, BLOOD: Lipase: 24 U/L (ref 11–51)

## 2020-04-04 LAB — LACTIC ACID, PLASMA
Lactic Acid, Venous: 5.5 mmol/L (ref 0.5–1.9)
Lactic Acid, Venous: 5.4 mmol/L (ref 0.5–1.9)

## 2020-04-04 LAB — PROTIME-INR
INR: 1.1 (ref 0.8–1.2)
Prothrombin Time: 13.4 seconds (ref 11.4–15.2)

## 2020-04-04 LAB — SARS CORONAVIRUS 2 (TAT 6-24 HRS): SARS Coronavirus 2: NEGATIVE

## 2020-04-04 LAB — PREPARE RBC (CROSSMATCH)

## 2020-04-04 MED ORDER — VITAMIN D 25 MCG (1000 UNIT) PO TABS
1000.0000 [IU] | ORAL_TABLET | Freq: Every day | ORAL | Status: DC
  Administered 2020-04-04 – 2020-04-06 (×2): 1000 [IU] via ORAL
  Filled 2020-04-04 (×2): qty 1

## 2020-04-04 MED ORDER — SODIUM CHLORIDE 0.9 % IV SOLN
10.0000 mL/h | Freq: Once | INTRAVENOUS | Status: AC
  Administered 2020-04-04: 10 mL/h via INTRAVENOUS

## 2020-04-04 MED ORDER — AMLODIPINE BESYLATE 10 MG PO TABS
10.0000 mg | ORAL_TABLET | Freq: Every day | ORAL | Status: DC
  Administered 2020-04-06: 10 mg via ORAL
  Filled 2020-04-04 (×2): qty 1

## 2020-04-04 MED ORDER — IOHEXOL 9 MG/ML PO SOLN
1000.0000 mL | Freq: Once | ORAL | Status: AC | PRN
  Administered 2020-04-04: 1000 mL via ORAL

## 2020-04-04 MED ORDER — ACETAMINOPHEN 650 MG RE SUPP: 325.0000 mg | Freq: Four times a day (QID) | RECTAL | Status: DC | PRN

## 2020-04-04 MED ORDER — PIPERACILLIN-TAZOBACTAM 3.375 G IVPB 30 MIN
3.3750 g | Freq: Once | INTRAVENOUS | Status: AC
  Administered 2020-04-04: 3.375 g via INTRAVENOUS
  Filled 2020-04-04: qty 50

## 2020-04-04 MED ORDER — ONDANSETRON HCL 4 MG/2ML IJ SOLN: 4.0000 mg | Freq: Four times a day (QID) | INTRAMUSCULAR | Status: DC | PRN

## 2020-04-04 MED ORDER — SODIUM CHLORIDE 0.9 % IV SOLN
80.0000 mg | Freq: Once | INTRAVENOUS | Status: AC
  Administered 2020-04-04: 80 mg via INTRAVENOUS
  Filled 2020-04-04: qty 80

## 2020-04-04 MED ORDER — ATORVASTATIN CALCIUM 20 MG PO TABS
40.0000 mg | ORAL_TABLET | Freq: Every day | ORAL | Status: DC
  Administered 2020-04-04 – 2020-04-06 (×2): 40 mg via ORAL
  Filled 2020-04-04 (×2): qty 2

## 2020-04-04 MED ORDER — ONDANSETRON HCL 4 MG PO TABS: 4.0000 mg | ORAL_TABLET | Freq: Four times a day (QID) | ORAL | Status: DC | PRN

## 2020-04-04 MED ORDER — ACETAMINOPHEN 325 MG PO TABS: 325.0000 mg | ORAL_TABLET | Freq: Four times a day (QID) | ORAL | Status: DC | PRN

## 2020-04-04 MED ORDER — NICOTINE 7 MG/24HR TD PT24
7.0000 mg | MEDICATED_PATCH | Freq: Every day | TRANSDERMAL | Status: DC | PRN
  Filled 2020-04-04: qty 1

## 2020-04-04 MED ORDER — IOHEXOL 300 MG/ML  SOLN
100.0000 mL | Freq: Once | INTRAMUSCULAR | Status: AC | PRN
  Administered 2020-04-04: 100 mL via INTRAVENOUS

## 2020-04-04 MED ORDER — PANTOPRAZOLE SODIUM 40 MG IV SOLR: 40.0000 mg | Freq: Two times a day (BID) | INTRAVENOUS | Status: DC

## 2020-04-04 MED ORDER — PIPERACILLIN-TAZOBACTAM 3.375 G IVPB
3.3750 g | Freq: Three times a day (TID) | INTRAVENOUS | Status: DC
  Administered 2020-04-04 – 2020-04-05 (×2): 3.375 g via INTRAVENOUS
  Filled 2020-04-04 (×2): qty 50

## 2020-04-04 MED ORDER — SODIUM CHLORIDE 0.9 % IV BOLUS
1000.0000 mL | Freq: Once | INTRAVENOUS | Status: AC
  Administered 2020-04-04: 1000 mL via INTRAVENOUS

## 2020-04-04 MED ORDER — SODIUM CHLORIDE 0.9 % IV SOLN
8.0000 mg/h | INTRAVENOUS | Status: DC
  Administered 2020-04-04 – 2020-04-05 (×2): 8 mg/h via INTRAVENOUS
  Filled 2020-04-04 (×2): qty 80

## 2020-04-04 NOTE — ED Notes (Signed)
Lab at bedside

## 2020-04-04 NOTE — ED Notes (Signed)
Hospitalist at bedside 

## 2020-04-04 NOTE — Progress Notes (Signed)
Pharmacy Antibiotic Note  Kimberly Mcdonald is a 55 y.o. female with  with medical history significant for hypertension, obesity, tobacco abuse, remote history of cocaine abuse and quit 20 years ago, history of left MCA stroke, left ICA 70 percent stenosis at the bulb status post left endarterectomy admitted on 04/04/2020 with concerns of worsening black stool and weakness for 2 days. Pharmacy has been consulted for Zosyn dosing for sepsis. Lactic acid 5.5, WBC 26.7, afebrile, pulse 124. Source of infection has not been identified yet; CT abdomen and pelvis no acute intra-abdominal pathology identified.   Plan: Zosyn 3.375g IV q8h (4 hour infusion)  Monitor renal function and adjust dose as clinically indicated.  Height: 5\' 3"  (160 cm) Weight: 110 kg (242 lb 8.1 oz) IBW/kg (Calculated) : 52.4  Temp (24hrs), Avg:97.9 F (36.6 C), Min:97.8 F (36.6 C), Max:98 F (36.7 C)  Recent Labs  Lab 04/04/20 1056 04/04/20 1532  WBC 26.7*  --   CREATININE 0.87  --   LATICACIDVEN 5.5* 5.4*    Estimated Creatinine Clearance: 85.9 mL/min (by C-G formula based on SCr of 0.87 mg/dL).    No Known Allergies  Antimicrobials this admission: 3/23 Zosyn >>   Microbiology results: 3/23 BCx: pending  Thank you for allowing pharmacy to be a part of this patient's care.  4/23, PharmD Pharmacy Resident  04/04/2020 5:41 PM

## 2020-04-04 NOTE — ED Provider Notes (Signed)
[facility] Emergency Department Provider Note   ____________________________________________   Event Date/Time   First MD Initiated Contact with Patient 04/04/20 1043     (approximate)  I have reviewed the triage vital signs and the nursing notes.   HISTORY  Chief Complaint Rectal Bleeding (Tarry stools , onset yesterday )    HPI Kimberly Mcdonald is a 55 y.o. female with stated past medical history of GI bleeds, hypertension, and CVA in the past who presents for dark tarry stools as well as worsening generalized fatigue over the last 48 hours patient also describes generalized abdominal cramping that does not radiate and has no exacerbating or relieving factors.  Patient currently denies any vision changes, tinnitus, difficulty speaking, facial droop, sore throat, chest pain, shortness of breath, nausea/vomiting/diarrhea, dysuria, or weakness/numbness/paresthesias in any extremity         Past Medical History:  Diagnosis Date  . Allergy    seasonal  . Anemia due to blood loss 05/08/2019  . Arthritis    knees and back  . Cerebrovascular accident (CVA) (HCC)   . HTN (hypertension)   . Hyperlipidemia   . Hypertension   . Substance abuse (HCC)    Marijuana smoker x35 yrs    Patient Active Problem List   Diagnosis Date Noted  . Bilateral knee pain 03/28/2020  . Sinusitis 02/29/2020  . Carotid stenosis 06/07/2019  . Cerebrovascular accident (CVA) (HCC)   . Right arm numbness 05/08/2019  . GIB (gastrointestinal bleeding) 05/08/2019  . Symptomatic anemia 05/08/2019  . Tobacco abuse 05/08/2019  . Hypokalemia 05/08/2019  . Hypertension 09/03/2017  . Arthritis 09/03/2017  . Healthcare maintenance 09/03/2017  . Low back pain 09/03/2017    Past Surgical History:  Procedure Laterality Date  . ENDARTERECTOMY Left 05/13/2019   Procedure: ENDARTERECTOMY CAROTID;  Surgeon: Renford Dills, MD;  Location: [facility] ORS;  Service: Vascular;   Laterality: Left;  . TUBAL LIGATION      Prior to Admission medications   Medication Sig Start Date End Date Taking? Authorizing Provider  acetaminophen (TYLENOL) 325 MG tablet Take 500 mg by mouth every 6 (six) hours as needed. PATIENT TAKES ONCE DAILY.    [provider]  amLODipine (NORVASC) 10 MG tablet Take 1 tablet (10 mg total) by mouth daily. 02/29/20   Iloabachie, Chioma E, NP  aspirin 81 MG EC tablet TAKE ONE TABLET BY MOUTH EVERY DAY AT 6 IN THE MORNING 02/01/20   Iloabachie, Chioma E, NP  atorvastatin (LIPITOR) 40 MG tablet Take 1 tablet (40 mg total) by mouth daily. 03/28/20   Iloabachie, Chioma E, NP  fluticasone (FLONASE) 50 MCG/ACT nasal spray Place 2 sprays into both nostrils daily. Patient not taking: Reported on 03/28/2020 02/29/20   Iloabachie, Chioma E, NP  pantoprazole (PROTONIX) 40 MG tablet Take 1 tablet (40 mg total) by mouth daily. 03/28/20 06/26/20  Iloabachie, Chioma E, NP    Allergies Patient has no known allergies.  Family History  Problem Relation Age of Onset  . Diabetes Mother   . Hypertension Mother   . Cancer Father   . Alcohol abuse Sister     Social History Social History   Tobacco Use  . Smoking status: Current Some Day Smoker    Packs/day: 0.70    Types: Cigarettes    Last attempt to quit: 05/08/2019    Years since quitting: 0.9  . Smokeless tobacco: Never Used  . Tobacco comment: only 2 cigarettes since surgery a month ago  Vaping Use  . Vaping Use: Never used  Substance Use Topics  . Alcohol use: Not Currently    Alcohol/week: 3.0 standard drinks    Types: 3 Cans of beer per week    Comment: have not been drinking since surgery  . Drug use: Yes    Frequency: 3.0 times per week    Types: Marijuana    Comment: unsure exactly of use    Review of Systems Constitutional: No fever/chills Eyes: No visual changes. ENT: No sore throat. Cardiovascular: Denies chest pain. Respiratory: Denies shortness of breath. Gastrointestinal:  Endorses abdominal pain and dark stools.  No nausea, no vomiting.  No diarrhea. Genitourinary: Negative for dysuria. Musculoskeletal: Negative for acute arthralgias Skin: Negative for rash. Neurological: Negative for headaches, weakness/numbness/paresthesias in any extremity Psychiatric: Negative for suicidal ideation/homicidal ideation   ____________________________________________   PHYSICAL EXAM:  VITAL SIGNS: ED Triage Vitals  Enc Vitals Group     BP 04/04/20 1048 107/76     Pulse Rate 04/04/20 1048 (!) 126     Resp 04/04/20 1048 18     Temp 04/04/20 1048 98 F (36.7 C)     Temp Source 04/04/20 1048 Oral     SpO2 04/04/20 1048 92 %     Weight 04/04/20 1049 242 lb 8.1 oz (110 kg)     Height 04/04/20 1049 5\' 3"  (1.6 m)     Head Circumference --      Peak Flow --      Pain Score 04/04/20 1049 0     Pain Loc --      Pain Edu? --      Excl. in GC? --    Constitutional: Alert and oriented. Well appearing and in no acute distress. Eyes: Conjunctivae are normal. PERRL. Head: Atraumatic. Nose: No congestion/rhinnorhea. Mouth/Throat: Mucous membranes are moist. Neck: No stridor Cardiovascular: Grossly normal heart sounds.  Good peripheral circulation. Respiratory: Normal respiratory effort.  No retractions. Gastrointestinal: Soft and generalized tenderness palpation. No distention. Musculoskeletal: No obvious deformities Neurologic:  Normal speech and language. No gross focal neurologic deficits are appreciated. Skin:  Skin is warm and dry. No rash noted. Psychiatric: Mood and affect are normal. Speech and behavior are normal.  ____________________________________________   LABS (all labs ordered are listed, but only abnormal results are displayed)  Labs Reviewed  COMPREHENSIVE METABOLIC PANEL - Abnormal; Notable for the following components:      Result Value   Chloride 116 (*)    CO2 16 (*)    Glucose, Bld 159 (*)    BUN 66 (*)    Calcium 8.7 (*)    All other  components within normal limits  LACTIC ACID, PLASMA - Abnormal; Notable for the following components:   Lactic Acid, Venous 5.5 (*)    All other components within normal limits  CBC WITH DIFFERENTIAL/PLATELET - Abnormal; Notable for the following components:   WBC 26.7 (*)    RBC 2.82 (*)    Hemoglobin 8.3 (*)    HCT 26.5 (*)    Neutro Abs 22.1 (*)    Monocytes Absolute 1.5 (*)    Abs Immature Granulocytes 0.24 (*)    All other components within normal limits  CULTURE, BLOOD (ROUTINE X 2)  CULTURE, BLOOD (ROUTINE X 2)  LIPASE, BLOOD  LACTIC ACID, PLASMA  CBC WITH DIFFERENTIAL/PLATELET  URINALYSIS, COMPLETE (UACMP) WITH MICROSCOPIC  TYPE AND SCREEN   ____________________________________________  EKG  ED ECG REPORT I, 04/06/20, the attending physician, personally viewed and  interpreted this ECG.  Date: 04/04/2020 EKG Time: 1113 Rate: 124 Rhythm: Tachycardic sinus rhythm QRS Axis: normal Intervals: normal ST/T Wave abnormalities: normal Narrative Interpretation: no evidence of acute ischemia  ____________________________________________  RADIOLOGY  ED MD interpretation: CT of the abdomen and pelvis with contrast shows thickened endometrial stripe among other incidental findings however no explanation for increased white count.  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  ____________________________________________   PROCEDURES  Procedure(s) performed (including Critical Care):  .1-3 Lead EKG Interpretation Performed by: Merwyn Katos, MD Authorized by: Merwyn Katos, MD     Interpretation: abnormal     ECG rate:  105   ECG rate assessment: tachycardic     Rhythm: sinus tachycardia     Ectopy: none     Conduction: normal       ____________________________________________   INITIAL IMPRESSION / ASSESSMENT AND PLAN / ED  COURSE  As part of my medical decision making, I reviewed the following data within the electronic MEDICAL RECORD NUMBER Nursing notes reviewed and incorporated, Labs reviewed, EKG interpreted, Old chart reviewed, Radiograph reviewed and Notes from prior ED visits reviewed and incorporated        Patient is a 55 year old female with a history of GI bleeds who presents for dark tarry stools as well as generalized abdominal pain.  Differential diagnosis for this patient includes but is not limited to: Symptomatic anemia, GI bleeding, urinary tract infection, intra-abdominal infection  Laboratory evaluation significant for: WBC 26.7 Hemoglobin 8.3 Lactic acid 5.5  CT abdomen/pelvis pending at the end of the shift  Care of this patient will be signed out to the oncoming physician at the end of my shift.  All pertinent patient information conveyed and all questions answered.  All further care and disposition decisions will be made by the oncoming physician.  Clinical Course as of 04/05/20 2343  Thu Apr 05, 2020  2339 Blood Culture ID Panel (Reflexed)(!) [EB]    Clinical Course User Index [EB] Merwyn Katos, MD     ____________________________________________   FINAL CLINICAL IMPRESSION(S) / ED DIAGNOSES  Final diagnoses:  None     ED Discharge Orders    None       Note:  This document was prepared using Dragon voice recognition software and may include unintentional dictation errors.   Merwyn Katos, MD 04/05/20 [phone]

## 2020-04-04 NOTE — ED Notes (Addendum)
IV team at bedside , advised that purple top was needed

## 2020-04-04 NOTE — ED Notes (Signed)
Pt noted to desat to 87% while sleeping. Pt place on 2lpm via [place] at this time. Pt O2 sats remain >97%.

## 2020-04-04 NOTE — ED Notes (Signed)
Per Hospitalist NPO until GI sees pt

## 2020-04-04 NOTE — Progress Notes (Signed)
See RT previous note, patient seen for flutter and IS. Patient has refused cpap as she has never worn one and hasn't been told that she need to.

## 2020-04-04 NOTE — Consult Note (Signed)
[facility] GI Inpatient Consult Note   Jamey Reas, M.D.  Reason for Consult: Lower GI bleeding   Attending Requesting Consult: Amy Cox, D.O.   History of Present Illness: Kimberly Mcdonald is a 55 y.o. 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  Patient presents today with "black stools" on and off for several months while taking iron supplementation.  Patient noticed, however, over the past week that bowel movements were becoming more frequent up to 10 loose tarry stools per day without symptoms of abdominal pain, hematemesis or hematochezia.  Patient denies any known history of peptic ulcer disease.  She takes a baby aspirin once a day.  No prescription anticoagulants.   Patient also suffered a gastrointestinal bleed while in the in April 2021; Dr. Tobi Bastos from GI saw the patient but was forced to defer elective endoscopy due to stroke and immediate surgical need..  At that same hospitalization, the patient underwent a carotid enterectomy by Dr. Levora Dredge.  Neurologist Dr. Thad Ranger reported that due to GI bleed and antiplatelet medication could not be given but that it would be addressed at some point in the future.  Patient was seen by Dr. Shon Millet, neurologist, on July 07, 2019 and continued patient on 81 mg aspirin daily for secondary stroke prevention but no prescription anticoagulation was prescribed. Patient apparently never followed up with [facility] to undergo endoluminal evaluation after hospital discharge in May 2021. Patient continues to smoke 5 to 10 cigarettes/day despite previous stroke.  She admits to drinking alcohol "occasionally". Pertinent positive GI history includes history of GERD and she was prescribed Protonix 40 mg daily by a nurse practitioner, Eulogio Bear, FNP.  Patient does not appear to recall the medication and does not currently take a PPI.  She reports remote heavy use of  NSAIDs up until 2019 but says she stopped after that point.  Past Medical History:  Past Medical History:  Diagnosis Date  . Allergy    seasonal  . Anemia due to blood loss 05/08/2019  . Arthritis    knees and back  . Cerebrovascular accident (CVA) (HCC)   . HTN (hypertension)   . Hyperlipidemia   . Hypertension   . 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 Acute upper GI bleeding 04/04/2020  . Obesity, Class III, BMI 40-49.9 (morbid obesity) (HCC) 04/04/2020  . Bilateral knee pain 03/28/2020  . Sinusitis 02/29/2020  . Carotid stenosis 06/07/2019  . Cerebrovascular accident (CVA) (HCC)   . Right arm numbness 05/08/2019  . GIB (gastrointestinal bleeding) 05/08/2019  . Symptomatic anemia 05/08/2019  . Tobacco abuse 05/08/2019  . Hypokalemia 05/08/2019  . Hypertension 09/03/2017  . Arthritis 09/03/2017  . Healthcare maintenance 09/03/2017  . Low back pain 09/03/2017    Past Surgical History: Past Surgical History:  Procedure Laterality Date  . ENDARTERECTOMY Left 05/13/2019   Procedure: ENDARTERECTOMY CAROTID;  Surgeon: Renford Dills, MD;  Location: [facility] ORS;  Service: Vascular;  Laterality: Left;  . TUBAL LIGATION      Allergies: No Known Allergies  Home Medications: (Not in a hospital admission)  Home medication reconciliation was completed with the patient.   Scheduled Inpatient Medications:   . [START ON 04/05/2020] amLODipine  10 mg Oral Daily  . atorvastatin  40 mg Oral Daily  . cholecalciferol  1,000 Units Oral Daily  . [  START ON 04/08/2020] pantoprazole  40 mg Intravenous Q12H    Continuous Inpatient Infusions:   . pantoprozole (PROTONIX) infusion    . pantoprazole (PROTONIX) 80 mg IVPB      PRN Inpatient Medications:  acetaminophen **OR** acetaminophen, nicotine, ondansetron **OR** ondansetron (ZOFRAN) IV  Family History: family history includes Alcohol abuse in  her sister; Cancer in her father; Diabetes in her mother; Hypertension in her mother.   GI Family History: Negative.  Social History:   reports that she has been smoking cigarettes. She has been smoking about 0.70 packs per day. She has never used smokeless tobacco. She reports previous alcohol use of about 3.0 standard drinks of alcohol per week. She reports current drug use. Frequency: 3.00 times per week. Drug: Marijuana. The patient denies ETOH, tobacco, or drug use.    Review of Systems: Review of Systems - History obtained from the patient General ROS: positive for  - chills and fatigue negative for - fever or weight loss Psychological ROS: negative ENT ROS: negative Allergy and Immunology ROS: positive for - cough negative for - latex, nasal congestion or postnasal drip Hematological and Lymphatic ROS: positive for - blood clots and blood transfusions negative for - pallor or swollen lymph nodes Endocrine ROS: negative Respiratory ROS: positive for - shortness of breath negative for - hemoptysis, sputum changes, stridor or wheezing Cardiovascular ROS: positive for - dyspnea on exertion, palpitations, rapid heart rate and shortness of breath negative for - chest pain Genito-Urinary ROS: no dysuria, trouble voiding, or hematuria positive for - incontinence and urinary frequency/urgency Musculoskeletal ROS: positive for - joint pain, joint stiffness, joint swelling and pain in knee - bilateral negative for - gait disturbance or joint pain Neurological ROS: no TIA or stroke symptoms Dermatological ROS: negative  Physical Examination: BP (!) 126/97   Pulse (!) 124   Temp 98 F (36.7 C) (Oral)   Resp 18   Ht 5\' 3"  (1.6 m)   Wt 110 kg   LMP 10/01/2016 (Approximate)   SpO2 100%   BMI 42.96 kg/m  Physical Exam Vitals reviewed.  Constitutional:      General: She is not in acute distress.    Appearance: She is obese. She is ill-appearing. She is not toxic-appearing or  diaphoretic.  HENT:     Head: Normocephalic and atraumatic.     Nose: Nose normal.     Mouth/Throat:     Pharynx: Oropharynx is clear.  Eyes:     Extraocular Movements: Extraocular movements intact.     Conjunctiva/sclera: Conjunctivae normal.     Pupils: Pupils are equal, round, and reactive to light.  Cardiovascular:     Rate and Rhythm: Tachycardia present.     Pulses: Normal pulses.  Pulmonary:     Breath sounds: Normal breath sounds. No wheezing, rhonchi or rales.  Chest:     Chest wall: No tenderness.  Abdominal:     General: There is no distension.     Palpations: Abdomen is soft. There is no mass.     Tenderness: There is no abdominal tenderness. There is no guarding or rebound.  Musculoskeletal:        General: Swelling present. Normal range of motion.     Cervical back: Normal range of motion. No tenderness.  Lymphadenopathy:     Cervical: No cervical adenopathy.  Skin:    General: Skin is warm and dry.     Capillary Refill: Capillary refill takes less than 2 seconds.  Neurological:  General: No focal deficit present.     Mental Status: She is alert and oriented to person, place, and time.  Psychiatric:        Mood and Affect: Mood normal.        Behavior: Behavior normal.        Thought Content: Thought content normal.        Judgment: Judgment normal.     Data: Lab Results  Component Value Date   WBC 26.7 (H) 04/04/2020   HGB 8.3 (L) 04/04/2020   HCT 26.5 (L) 04/04/2020   MCV 94.0 04/04/2020   PLT 325 04/04/2020   Recent Labs  Lab 04/04/20 1056  HGB 8.3*   Lab Results  Component Value Date   NA 143 04/04/2020   K 3.8 04/04/2020   CL 116 (H) 04/04/2020   CO2 16 (L) 04/04/2020   BUN 66 (H) 04/04/2020   CREATININE 0.87 04/04/2020   Lab Results  Component Value Date   ALT 8 04/04/2020   AST 16 04/04/2020   ALKPHOS 74 04/04/2020   BILITOT 0.7 04/04/2020   No results for input(s): APTT, INR, PTT in the last 168 hours. CBC Latest Ref Rng  & Units 04/04/2020 02/29/2020 06/16/2019  WBC 4.0 - 10.5 K/uL 26.7(H) 7.0 8.7  Hemoglobin 12.0 - 15.0 g/dL 8.3(L) 12.9 12.8  Hematocrit 36.0 - 46.0 % 26.5(L) 41.2 41.1  Platelets 150 - 400 K/uL 325 388 279    STUDIES: CT Abdomen Pelvis W Contrast  Result Date: 04/04/2020 CLINICAL DATA:  Melena, generalized weakness, acute nonlocalized abdominal pain EXAM: CT ABDOMEN AND PELVIS WITH CONTRAST TECHNIQUE: Multidetector CT imaging of the abdomen and pelvis was performed using the standard protocol following bolus administration of intravenous contrast. CONTRAST:  OMNIPAQUE IOHEXOL 300 MG/ML  SOLN COMPARISON:  None. FINDINGS: Lower chest: The visualized lung bases are clear bilaterally. The visualized heart and pericardium are unremarkable. Large hiatal hernia. Hepatobiliary: No focal liver abnormality is seen. No gallstones, gallbladder wall thickening, or biliary dilatation. Pancreas: Unremarkable Spleen: Unremarkable Adrenals/Urinary Tract: Adrenal glands are unremarkable. Kidneys are normal, without renal calculi, focal lesion, or hydronephrosis. Bladder is unremarkable. Stomach/Bowel: Mild sigmoid diverticulosis. The large and small bowel are otherwise unremarkable. Appendix normal. No free intraperitoneal gas or fluid. 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 Distally, the vessel appears patent. The abdominal vasculature is otherwise unremarkable. No aortic aneurysm. No pathologic adenopathy within the abdomen and pelvis. Reproductive: The endometrium appears thickened measuring up to 14 mm in thickness. Lobulated appearance of the uterine fundus likely relates to underlying uterine fibroids. The pelvic organs are otherwise unremarkable. Other: Tiny fat containing umbilical and small fat containing bilateral inguinal hernias are present. The rectum is unremarkable.  Musculoskeletal: Degenerative changes are seen within the lumbar spine. No acute bone abnormality. No lytic or blastic bone lesions. IMPRESSION: No acute intra-abdominal pathology identified. No definite radiographic explanation for the patient's reported symptoms. Large hiatal hernia. 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 Inferior mesenteric artery and celiac axis appear widely patent, however. Thickening of the endometrium, abnormal in a postmenopausal patient. This should be confirmed with with dedicated sonography. In Electronically Signed   By: Helyn Numbers MD   On: 04/04/2020 15:27   @IMAGES @  Assessment::  1. Melena - Likely UGI bleed but cannot rule out lower GI source. DDx  includes PUD, gastritis with vascular malformations, malignancy (UGI or LGI), bowel ischemia, small bowel source (meckel's diverticulum, diverticulosis, sb tumor, nsaid enteropathy).  2. Posthemorrhagic anemia. Patient appears stable and normotensive. pRBC ordered and blood is hanging.  3. Tobacco abuse.  4. Cerebrovascular disease w/ hx of Left MCA dist stroke -     COVID-19 status: Pending @ 1747   Recommendations: 1. IV fluid resuscitation, support. 2. Serial H/H. 3. Transfuse as necessary to support oxygen carrying capacity and control symptoms of DOE and Dyspnea at rest, Fatigue. 4. EGD and colonoscopy tomorrow. The patient understands the nature of the planned procedure, indications, risks, alternatives and potential complications including but not limited to bleeding, infection, perforation, damage to internal organs and possible oversedation/side effects from anesthesia. The patient agrees and gives consent to proceed.  Please refer to procedure notes for findings, recommendations and patient disposition/instructions. 5. Further recommendations will be forthcoming pending response to therapy and findings on  endoluminal evaluation.  Thank you for the consult. Please call with questions or concerns.  Rosina Lowenstein, "Mellody Dance MD [facility] [address] [place], [place] [address] [phone]  04/04/2020 5:25 PM

## 2020-04-04 NOTE — ED Notes (Addendum)
Called lab to draw lactic acid , blood cultures   I attempted 2 times before calling

## 2020-04-04 NOTE — ED Notes (Signed)
Called lab for blood draw pt difficult IV stick and unable to get blood with current iv

## 2020-04-04 NOTE — H&P (View-Only) (Signed)
[facility] GI Inpatient Consult Note   Jamey Reas, M.D.  Reason for Consult: Lower GI bleeding   Attending Requesting Consult: Amy Cox, D.O.   History of Present Illness: Kimberly Mcdonald is a 55 y.o. 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  Patient presents today with "black stools" on and off for several months while taking iron supplementation.  Patient noticed, however, over the past week that bowel movements were becoming more frequent up to 10 loose tarry stools per day without symptoms of abdominal pain, hematemesis or hematochezia.  Patient denies any known history of peptic ulcer disease.  She takes a baby aspirin once a day.  No prescription anticoagulants.   Patient also suffered a gastrointestinal bleed while in the in April 2021; Dr. Tobi Bastos from GI saw the patient but was forced to defer elective endoscopy due to stroke and immediate surgical need..  At that same hospitalization, the patient underwent a carotid enterectomy by Dr. Levora Dredge.  Neurologist Dr. Thad Ranger reported that due to GI bleed and antiplatelet medication could not be given but that it would be addressed at some point in the future.  Patient was seen by Dr. Shon Millet, neurologist, on July 07, 2019 and continued patient on 81 mg aspirin daily for secondary stroke prevention but no prescription anticoagulation was prescribed. Patient apparently never followed up with [facility] to undergo endoluminal evaluation after hospital discharge in May 2021. Patient continues to smoke 5 to 10 cigarettes/day despite previous stroke.  She admits to drinking alcohol "occasionally". Pertinent positive GI history includes history of GERD and she was prescribed Protonix 40 mg daily by a nurse practitioner, Eulogio Bear, FNP.  Patient does not appear to recall the medication and does not currently take a PPI.  She reports remote heavy use of  NSAIDs up until 2019 but says she stopped after that point.  Past Medical History:  Past Medical History:  Diagnosis Date  . Allergy    seasonal  . Anemia due to blood loss 05/08/2019  . Arthritis    knees and back  . Cerebrovascular accident (CVA) (HCC)   . HTN (hypertension)   . Hyperlipidemia   . Hypertension   . 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 Acute upper GI bleeding 04/04/2020  . Obesity, Class III, BMI 40-49.9 (morbid obesity) (HCC) 04/04/2020  . Bilateral knee pain 03/28/2020  . Sinusitis 02/29/2020  . Carotid stenosis 06/07/2019  . Cerebrovascular accident (CVA) (HCC)   . Right arm numbness 05/08/2019  . GIB (gastrointestinal bleeding) 05/08/2019  . Symptomatic anemia 05/08/2019  . Tobacco abuse 05/08/2019  . Hypokalemia 05/08/2019  . Hypertension 09/03/2017  . Arthritis 09/03/2017  . Healthcare maintenance 09/03/2017  . Low back pain 09/03/2017    Past Surgical History: Past Surgical History:  Procedure Laterality Date  . ENDARTERECTOMY Left 05/13/2019   Procedure: ENDARTERECTOMY CAROTID;  Surgeon: Renford Dills, MD;  Location: [facility] ORS;  Service: Vascular;  Laterality: Left;  . TUBAL LIGATION      Allergies: No Known Allergies  Home Medications: (Not in a hospital admission)  Home medication reconciliation was completed with the patient.   Scheduled Inpatient Medications:   . [START ON 04/05/2020] amLODipine  10 mg Oral Daily  . atorvastatin  40 mg Oral Daily  . cholecalciferol  1,000 Units Oral Daily  . [  START ON 04/08/2020] pantoprazole  40 mg Intravenous Q12H    Continuous Inpatient Infusions:   . pantoprozole (PROTONIX) infusion    . pantoprazole (PROTONIX) 80 mg IVPB      PRN Inpatient Medications:  acetaminophen **OR** acetaminophen, nicotine, ondansetron **OR** ondansetron (ZOFRAN) IV  Family History: family history includes Alcohol abuse in  her sister; Cancer in her father; Diabetes in her mother; Hypertension in her mother.   GI Family History: Negative.  Social History:   reports that she has been smoking cigarettes. She has been smoking about 0.70 packs per day. She has never used smokeless tobacco. She reports previous alcohol use of about 3.0 standard drinks of alcohol per week. She reports current drug use. Frequency: 3.00 times per week. Drug: Marijuana. The patient denies ETOH, tobacco, or drug use.    Review of Systems: Review of Systems - History obtained from the patient General ROS: positive for  - chills and fatigue negative for - fever or weight loss Psychological ROS: negative ENT ROS: negative Allergy and Immunology ROS: positive for - cough negative for - latex, nasal congestion or postnasal drip Hematological and Lymphatic ROS: positive for - blood clots and blood transfusions negative for - pallor or swollen lymph nodes Endocrine ROS: negative Respiratory ROS: positive for - shortness of breath negative for - hemoptysis, sputum changes, stridor or wheezing Cardiovascular ROS: positive for - dyspnea on exertion, palpitations, rapid heart rate and shortness of breath negative for - chest pain Genito-Urinary ROS: no dysuria, trouble voiding, or hematuria positive for - incontinence and urinary frequency/urgency Musculoskeletal ROS: positive for - joint pain, joint stiffness, joint swelling and pain in knee - bilateral negative for - gait disturbance or joint pain Neurological ROS: no TIA or stroke symptoms Dermatological ROS: negative  Physical Examination: BP (!) 126/97   Pulse (!) 124   Temp 98 F (36.7 C) (Oral)   Resp 18   Ht 5' 3" (1.6 m)   Wt 110 kg   LMP 10/01/2016 (Approximate)   SpO2 100%   BMI 42.96 kg/m  Physical Exam Vitals reviewed.  Constitutional:      General: She is not in acute distress.    Appearance: She is obese. She is ill-appearing. She is not toxic-appearing or  diaphoretic.  HENT:     Head: Normocephalic and atraumatic.     Nose: Nose normal.     Mouth/Throat:     Pharynx: Oropharynx is clear.  Eyes:     Extraocular Movements: Extraocular movements intact.     Conjunctiva/sclera: Conjunctivae normal.     Pupils: Pupils are equal, round, and reactive to light.  Cardiovascular:     Rate and Rhythm: Tachycardia present.     Pulses: Normal pulses.  Pulmonary:     Breath sounds: Normal breath sounds. No wheezing, rhonchi or rales.  Chest:     Chest wall: No tenderness.  Abdominal:     General: There is no distension.     Palpations: Abdomen is soft. There is no mass.     Tenderness: There is no abdominal tenderness. There is no guarding or rebound.  Musculoskeletal:        General: Swelling present. Normal range of motion.     Cervical back: Normal range of motion. No tenderness.  Lymphadenopathy:     Cervical: No cervical adenopathy.  Skin:    General: Skin is warm and dry.     Capillary Refill: Capillary refill takes less than 2 seconds.  Neurological:       General: No focal deficit present.     Mental Status: She is alert and oriented to person, place, and time.  Psychiatric:        Mood and Affect: Mood normal.        Behavior: Behavior normal.        Thought Content: Thought content normal.        Judgment: Judgment normal.     Data: Lab Results  Component Value Date   WBC 26.7 (H) 04/04/2020   HGB 8.3 (L) 04/04/2020   HCT 26.5 (L) 04/04/2020   MCV 94.0 04/04/2020   PLT 325 04/04/2020   Recent Labs  Lab 04/04/20 1056  HGB 8.3*   Lab Results  Component Value Date   NA 143 04/04/2020   K 3.8 04/04/2020   CL 116 (H) 04/04/2020   CO2 16 (L) 04/04/2020   BUN 66 (H) 04/04/2020   CREATININE 0.87 04/04/2020   Lab Results  Component Value Date   ALT 8 04/04/2020   AST 16 04/04/2020   ALKPHOS 74 04/04/2020   BILITOT 0.7 04/04/2020   No results for input(s): APTT, INR, PTT in the last 168 hours. CBC Latest Ref Rng  & Units 04/04/2020 02/29/2020 06/16/2019  WBC 4.0 - 10.5 K/uL 26.7(H) 7.0 8.7  Hemoglobin 12.0 - 15.0 g/dL 8.3(L) 12.9 12.8  Hematocrit 36.0 - 46.0 % 26.5(L) 41.2 41.1  Platelets 150 - 400 K/uL 325 388 279    STUDIES: CT Abdomen Pelvis W Contrast  Result Date: 04/04/2020 CLINICAL DATA:  Melena, generalized weakness, acute nonlocalized abdominal pain EXAM: CT ABDOMEN AND PELVIS WITH CONTRAST TECHNIQUE: Multidetector CT imaging of the abdomen and pelvis was performed using the standard protocol following bolus administration of intravenous contrast. CONTRAST:  OMNIPAQUE IOHEXOL 300 MG/ML  SOLN COMPARISON:  None. FINDINGS: Lower chest: The visualized lung bases are clear bilaterally. The visualized heart and pericardium are unremarkable. Large hiatal hernia. Hepatobiliary: No focal liver abnormality is seen. No gallstones, gallbladder wall thickening, or biliary dilatation. Pancreas: Unremarkable Spleen: Unremarkable Adrenals/Urinary Tract: Adrenal glands are unremarkable. Kidneys are normal, without renal calculi, focal lesion, or hydronephrosis. Bladder is unremarkable. Stomach/Bowel: Mild sigmoid diverticulosis. The large and small bowel are otherwise unremarkable. Appendix normal. No free intraperitoneal gas or fluid. 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 Distally, the vessel appears patent. The abdominal vasculature is otherwise unremarkable. No aortic aneurysm. No pathologic adenopathy within the abdomen and pelvis. Reproductive: The endometrium appears thickened measuring up to 14 mm in thickness. Lobulated appearance of the uterine fundus likely relates to underlying uterine fibroids. The pelvic organs are otherwise unremarkable. Other: Tiny fat containing umbilical and small fat containing bilateral inguinal hernias are present. The rectum is unremarkable.  Musculoskeletal: Degenerative changes are seen within the lumbar spine. No acute bone abnormality. No lytic or blastic bone lesions. IMPRESSION: No acute intra-abdominal pathology identified. No definite radiographic explanation for the patient's reported symptoms. Large hiatal hernia. 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 Inferior mesenteric artery and celiac axis appear widely patent, however. Thickening of the endometrium, abnormal in a postmenopausal patient. This should be confirmed with with dedicated sonography. In Electronically Signed   By: Helyn Numbers MD   On: 04/04/2020 15:27   @IMAGES @  Assessment::  1. Melena - Likely UGI bleed but cannot rule out lower GI source. DDx  includes PUD, gastritis with vascular malformations, malignancy (UGI or LGI), bowel ischemia, small bowel source (meckel's diverticulum, diverticulosis, sb tumor, nsaid enteropathy).  2. Posthemorrhagic anemia. Patient appears stable and normotensive. pRBC ordered and blood is hanging.  3. Tobacco abuse.  4. Cerebrovascular disease w/ hx of Left MCA dist stroke -     COVID-19 status: Pending @ 1747   Recommendations: 1. IV fluid resuscitation, support. 2. Serial H/H. 3. Transfuse as necessary to support oxygen carrying capacity and control symptoms of DOE and Dyspnea at rest, Fatigue. 4. EGD and colonoscopy tomorrow. The patient understands the nature of the planned procedure, indications, risks, alternatives and potential complications including but not limited to bleeding, infection, perforation, damage to internal organs and possible oversedation/side effects from anesthesia. The patient agrees and gives consent to proceed.  Please refer to procedure notes for findings, recommendations and patient disposition/instructions. 5. Further recommendations will be forthcoming pending response to therapy and findings on  endoluminal evaluation.  Thank you for the consult. Please call with questions or concerns.  Rosina Lowenstein, "Mellody Dance MD [facility] [address] [place], [place] [address] [phone]  04/04/2020 5:25 PM

## 2020-04-04 NOTE — ED Notes (Signed)
Per Hospitalist to wait to draw INR when CBC is drawn

## 2020-04-04 NOTE — ED Notes (Signed)
Pt ambulated to the restroom.

## 2020-04-04 NOTE — ED Notes (Signed)
Lab called to collect CBC and PT-INR. Lab stated they will send someone to collect as soon as possible.

## 2020-04-04 NOTE — ED Triage Notes (Signed)
Pt from home, C/C Black Tarry stools that started yesterday, pt was taken off Iron aprox 2xdays.  Pt also complaining of generalized weakness

## 2020-04-04 NOTE — ED Notes (Signed)
Per Lab , still missing a purple top .

## 2020-04-04 NOTE — Progress Notes (Signed)
Patient states she does not wear cpap at home and will not wear one while hospitalized. No history of OSA, I will remove unit from bedside.

## 2020-04-04 NOTE — ED Notes (Signed)
Critical   Lactic 5.4  MD. Vicente Males notified

## 2020-04-04 NOTE — H&P (Signed)
History and Physical   Kimberly Mcdonald MGQ:[id number] DOB: Jan 24, 1965 DOA: 04/04/2020  PCP: Rolm Gala, NP  Outpatient Specialists: Dr. Shon Millet, [facility] Patient coming from: home   I have personally briefly reviewed patient's old medical records in [facility] EMR.  Chief Concern: black stool and weakness  HPI: Kimberly Mcdonald is a 55 y.o. 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  She reports that the black stool has been worsening with multiple black bowel movements for the last 2 days. She reports that she has been so weak she has not been out of bed to eat for 2 days thus prompting her to present to the emergency department for further evaluation.  She reports that the weakness is worse with exertion.  She also endorses shortness of breath that is new with exertion.  Denies bilateral lower extremity swelling.  She also endorses acid reflux symptoms that has been worsening for the last month requiring her to take Tums nearly daily.  She also endorses that she takes iron tablets.   She denies fever, chest pain, abdominal pain, postprandial abdominal pain, nausea, vomiting, dysuria, hematuria, vaginal bleeding.  She denies changes to her diet.  She denies sick contacts.  She reports that she is not taken her prescribed home medications for 2 days due to the profound generalized weakness.  She reports that this is similar to the time when she had GI bleed in May 2021 which she received 4 units of blood.  She reports that she has never had a colonoscopy or an endoscopy.  She reports that in May 2021, she did not get colonoscopy due to her presentation of a stroke.   She states that she will be following with outpatient oncology for mammogram, Pap smear and possible colonoscopy however this has not been done yet.  Social history: She lives with her daughter.  She smokes 3 to 4 cigarettes/day and is ready to quit.  She denies EtOH and current recreational drug use.  Vaccinations: She is vaccinated for COVID-19 x2 doses, no booster  ROS: Constitutional: no weight change, no fever ENT/Mouth: no sore throat, no rhinorrhea Eyes: no eye pain, no vision changes Cardiovascular: no chest pain, no dyspnea,  no edema, no palpitations Respiratory: no cough, no sputum, no wheezing Gastrointestinal: no nausea, no vomiting, no diarrhea, no constipation Genitourinary: no urinary incontinence, no dysuria, no hematuria Musculoskeletal: no arthralgias, no myalgias Skin: no skin lesions, no pruritus, Neuro: + weakness, no loss of consciousness, no syncope Psych: no anxiety, no depression, + decrease appetite Heme/Lymph: no bruising, black stool  ED Course: Discussed with ED provider, patient requiring hospitalization due to symptomatic anemia suspect secondary to GI bleed.  ED provider ordered 1 unit of packed red blood cells.  Vitals in the emergency department was remarkable for temperature of 98 Fahrenheit, respiration rate of 19, heart rate of 132, blood pressure 137/87, satting at 100% on room air.  Labs in the emergency department was remarkable for serum sodium 143, potassium 3.8, chloride 116, bicarb 16, serum creatinine of 0.87, nonfasting blood glucose 159, WBC of 26.7, hemoglobin 8.3, platelets 325.  CT of the abdomen and pelvis with contrast was read as no acute intracranial pathology identified.  Large hiatal hernia.  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  Inferior mesenteric artery and celiac axis widely patent.  Thickening of the endometrium.  Mild sigmoid  diverticulosis.  Assessment/Plan  Principal Problem:   Acute upper GI bleeding Active Problems:   Hypertension   Symptomatic anemia   Tobacco abuse   Cerebrovascular accident (CVA) (HCC)   Carotid stenosis   Obesity, Class III, BMI 40-49.9 (morbid obesity) (HCC)   # Severe symptomatic normocytic anemia-suspect multifactorial including secondary to iron deficiency anemia and upper GI bleed -Hemoglobin on presentation is 8.3, baseline hemoglobin is 12.8-12.9 -ED provider ordered 1 unit of packed red blood for transfusion -Timed CBC at 1800 -Checking PT INR -I consulted GI, Dr. Norma Fredrickson will see the patient -N.p.o. except for sips with meds and ice chips now, n.p.o. after midnight -I started patient on Protonix GTT -Holding aspirin 81 mg -CBC in the a.m.  # Meets sirs criteria, with lactic acid, sinus tachycardia, and elevated wbc  # No identified source of infection # Leukocytosis-no evidence of infection at this time, may be secondary to reactive severe acute anemia -UA was negative, patient has no cough, no fever, no chills, CT of the abdomen and pelvis with contrast was negative for abdominal infection -Blood cultures x2 ordered per EDP -ED provider ordered 1 dose of Zosyn IV -Zosyn per pharmacy -CBC in a.m., and if no more leukocytosis, I recommend to discontinue abx  # Sinus tachycardia-secondary to severe acute anemia-treat as above  # Focal dissection versus eccentric plaque within the proximal superior mesenteric artery resulting in 50% stenosis of this vessel -Patient does not have abdominal pain or postprandial abdominal pain -Vascular, Dr. Wyn Quaker has been consulted via secure chat  # Hypertension now normotensive without taking her blood pressure medicine for 2 days which may be to due to GI bleed -Amlodipine 10 mg daily resumed for 04/05/2020  # History of left MCA-presumed secondary to left ICA stenosis; resumed atorvastatin 40 mg daily, holding aspirin #  CAD-atorvastatin 40 mg daily, holding home aspirin # Hyperlipidemia-atorvastatin 40 mg daily # Large hiatal hernia-Protonix daily # Tobacco use with previous tobacco abuse - patient states she will quit now, nicotine patch prn ordered # Thickening of the endometrium-patient has follow-up with OB/GYN, Pap smear outpatient, I recommended the patient keep these appointments # Morbid obesity - outpatient weight loss  Chart reviewed.   Hospitalization for 05/08/2019 - 05/15/2019: Presented for dark stool and right arm numbness.  She was admitted for upper GI bleed with severe acute anemia.  She was also found to have acute left MCA stroke.  GI was consulted.  Neurology was consulted.  CTA of the head and neck showed 70% stenosis of the left ICA bulb.  Hypercoagulable panel was negative.  Vascular was consulted and patient underwent carotid endarterectomy. She received 4 units of packed red blood cells and stable h/h. During this admission, due to patient's presentation of acute stroke, GI held off colonoscopy for several weeks. GI cleared patient for starting aspirin.   Echo 05/09/19: Read as EF of 55 to 60%, grade 1 diastolic dysfunction  DVT prophylaxis: TED hose, holding pharmacologic DVT prophylaxis due to severe acute anemia secondary to upper GI bleed Code Status: Full code Diet: N.p.o. Family Communication: No Disposition Plan: Pending clinical course Consults called: Gastroenterology, vascular Admission status: Observation, progressive cardiac with telemetry  Past Medical History:  Diagnosis Date  . Allergy  seasonal  . Anemia due to blood loss 05/08/2019  . Arthritis    knees and back  . Cerebrovascular accident (CVA) (HCC)   . HTN (hypertension)   . Hyperlipidemia   . Hypertension   . Substance abuse (HCC)    Marijuana smoker x35 yrs   Past Surgical History:  Procedure Laterality Date  . ENDARTERECTOMY Left 05/13/2019   Procedure: ENDARTERECTOMY CAROTID;  Surgeon: Renford Dills, MD;  Location: [facility] ORS;  Service: Vascular;  Laterality: Left;  . TUBAL LIGATION     Social History:  reports that she has been smoking cigarettes. She has been smoking about 0.70 packs per day. She has never used smokeless tobacco. She reports previous alcohol use of about 3.0 standard drinks of alcohol per week. She reports current drug use. Frequency: 3.00 times per week. Drug: Marijuana.  No Known Allergies Family History  Problem Relation Age of Onset  . Diabetes Mother   . Hypertension Mother   . Cancer Father   . Alcohol abuse Sister    Family history: Family history reviewed and not pertinent  Prior to Admission medications   Medication Sig Start Date End Date Taking? Authorizing Provider  amLODipine (NORVASC) 10 MG tablet Take 1 tablet (10 mg total) by mouth daily. 02/29/20  Yes Iloabachie, Chioma E, NP  aspirin 81 MG EC tablet TAKE ONE TABLET BY MOUTH EVERY DAY AT 6 IN THE MORNING 02/01/20  Yes Iloabachie, Chioma E, NP  atorvastatin (LIPITOR) 40 MG tablet Take 1 tablet (40 mg total) by mouth daily. 03/28/20  Yes Iloabachie, Chioma E, NP  cholecalciferol (VITAMIN D3) 25 MCG (1000 UNIT) tablet Take 1,000 Units by mouth daily.   Yes [provider]  ferrous sulfate 325 (65 FE) MG tablet Take 325 mg by mouth daily.   Yes [provider]  fluticasone (FLONASE) 50 MCG/ACT nasal spray Place 2 sprays into both nostrils daily. 02/29/20  Yes Iloabachie, Chioma E, NP  pantoprazole (PROTONIX) 40 MG tablet Take 1 tablet (40 mg total) by mouth daily. 03/28/20 06/26/20 Yes Iloabachie, Chioma E, NP   Physical Exam: Vitals:   04/04/20 1300 04/04/20 1330 04/04/20 1605 04/04/20 1621  BP:  (!) 153/113 (!) 128/96 (!) 126/97  Pulse: (!) 115 (!) 118 (!) 126 (!) 124  Resp: (!) 21 19 18 18   Temp:   97.8 F (36.6 C) 98 F (36.7 C)  TempSrc:   Oral Oral  SpO2: 98% 97% 94% 100%  Weight:      Height:       Constitutional: appears age-appropriate, NAD, calm,  comfortable Eyes: PERRL, lids and conjunctivae normal ENMT: Mucous membranes are moist. Posterior pharynx clear of any exudate or lesions. Age-appropriate dentition. Hearing appropriate Neck: normal, supple, no masses, no thyromegaly Respiratory: clear to auscultation bilaterally, no wheezing, no crackles. Normal respiratory effort. No accessory muscle use.  Cardiovascular: Regular rate and rhythm, no murmurs / rubs / gallops. No extremity edema. 2+ pedal pulses. No carotid bruits.  Abdomen: Morbidly obese abdomen, no tenderness, no masses palpated, no hepatosplenomegaly. Bowel sounds positive.  Musculoskeletal: no clubbing / cyanosis. No joint deformity upper and lower extremities. Good ROM, no contractures, no atrophy. Normal muscle tone.  Skin: no rashes, lesions, ulcers. No induration.  Left sided neck area scarring appears well-healed and chronic. Neurologic: Sensation intact. Strength 5/5 in all 4.  Psychiatric: Normal judgment and insight. Alert and oriented x 3. Normal mood.   EKG: independently reviewed, showing sinus tachycardia with rate of 124, QTc 431  Imaging on Admission: I personally reviewed and I agree with radiologist reading as below.  CT Abdomen Pelvis W Contrast  Result Date: 04/04/2020 CLINICAL DATA:  Melena, generalized weakness, acute nonlocalized abdominal pain EXAM: CT ABDOMEN AND PELVIS WITH CONTRAST TECHNIQUE: Multidetector CT imaging of the abdomen and pelvis was performed using the standard protocol following bolus administration of intravenous contrast. CONTRAST:  100mL OMNIPAQUE IOHEXOL 300 MG/ML  SOLN COMPARISON:  None. FINDINGS: Lower chest: The visualized lung bases are clear bilaterally. The visualized heart and pericardium are unremarkable. Large hiatal hernia. Hepatobiliary: No focal liver abnormality is seen. No gallstones, gallbladder wall thickening, or biliary dilatation. Pancreas: Unremarkable Spleen: Unremarkable Adrenals/Urinary Tract: Adrenal glands  are unremarkable. Kidneys are normal, without renal calculi, focal lesion, or hydronephrosis. Bladder is unremarkable. Stomach/Bowel: Mild sigmoid diverticulosis. The large and small bowel are otherwise unremarkable. Appendix normal. No free intraperitoneal gas or fluid. 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 Distally, the vessel appears patent. The abdominal vasculature is otherwise unremarkable. No aortic aneurysm. No pathologic adenopathy within the abdomen and pelvis. Reproductive: The endometrium appears thickened measuring up to 14 mm in thickness. Lobulated appearance of the uterine fundus likely relates to underlying uterine fibroids. The pelvic organs are otherwise unremarkable. Other: Tiny fat containing umbilical and small fat containing bilateral inguinal hernias are present. The rectum is unremarkable. Musculoskeletal: Degenerative changes are seen within the lumbar spine. No acute bone abnormality. No lytic or blastic bone lesions. IMPRESSION: No acute intra-abdominal pathology identified. No definite radiographic explanation for the patient's reported symptoms. Large hiatal hernia. 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 Inferior mesenteric artery and celiac axis appear widely patent, however. Thickening of the endometrium, abnormal in a postmenopausal patient. This should be confirmed with with dedicated sonography. In Electronically Signed   By: Helyn NumbersAshesh  Parikh MD   On: 04/04/2020 15:27   Labs on Admission: I have personally reviewed following labs  CBC: Recent Labs  Lab 04/04/20 1056  WBC 26.7*  NEUTROABS 22.1*  HGB 8.3*  HCT 26.5*  MCV 94.0  PLT 325   Basic Metabolic Panel: Recent Labs  Lab 04/04/20 1056  NA 143  K 3.8  CL 116*  CO2 16*  GLUCOSE 159*  BUN  66*  CREATININE 0.87  CALCIUM 8.7*   GFR: Estimated Creatinine Clearance: 85.9 mL/min (by C-G formula based on SCr of 0.87 mg/dL).  Liver Function Tests: Recent Labs  Lab 04/04/20 1056  AST 16  ALT 8  ALKPHOS 74  BILITOT 0.7  PROT 6.9  ALBUMIN 3.5   Recent Labs  Lab 04/04/20 1056  LIPASE 24   Urine analysis:    Component Value Date/Time   COLORURINE YELLOW (A) 04/04/2020 1459   APPEARANCEUR HAZY (A) 04/04/2020 1459   LABSPEC 1.023 04/04/2020 1459   PHURINE 6.0 04/04/2020 1459   GLUCOSEU NEGATIVE 04/04/2020 1459   HGBUR NEGATIVE 04/04/2020 1459   BILIRUBINUR NEGATIVE 04/04/2020 1459   KETONESUR NEGATIVE 04/04/2020 1459   PROTEINUR NEGATIVE 04/04/2020 1459   NITRITE NEGATIVE 04/04/2020 1459   LEUKOCYTESUR NEGATIVE 04/04/2020 1459   CRITICAL CARE Performed by: Nadyne CoombesAmy N Cox  Total critical care time: 35 minutes  Critical care time was exclusive of separately billable procedures and treating other patients.  Critical care was necessary to treat or prevent imminent or life-threatening deterioration. GI bleed/severe anemia/GI hemorrhage  Critical care was time spent personally by me on the following activities: development of treatment plan with patient and/or surrogate as well as nursing, discussions with consultants, evaluation of patient's response to treatment, examination of patient, obtaining history from patient or surrogate, ordering and performing treatments and interventions, ordering and review of laboratory studies, ordering and review of radiographic studies, pulse oximetry and re-evaluation of patient's condition.  Amy N Cox D.O. [facility]  If 7PM-7AM, please contact overnight-coverage provider If 7AM-7PM, please contact day coverage provider [email or web address]  04/04/2020, 4:39 PM

## 2020-04-04 NOTE — ED Notes (Signed)
Critical Result   Lactic Acid  5.5  MD/ Bradler advised

## 2020-04-05 ENCOUNTER — Inpatient Hospital Stay: Payer: Self-pay | Admitting: Anesthesiology

## 2020-04-05 ENCOUNTER — Encounter: Payer: Self-pay | Admitting: Internal Medicine

## 2020-04-05 ENCOUNTER — Encounter
Admission: EM | Disposition: A | Discharge status: Home / Self Care | Payer: Self-pay | Source: Home / Self Care | Attending: Internal Medicine

## 2020-04-05 DIAGNOSIS — D649 Anemia, unspecified: Secondary | ICD-10-CM

## 2020-04-05 DIAGNOSIS — K922 Gastrointestinal hemorrhage, unspecified: Secondary | ICD-10-CM | POA: Diagnosis present

## 2020-04-05 DIAGNOSIS — R7881 Bacteremia: Secondary | ICD-10-CM

## 2020-04-05 DIAGNOSIS — B957 Other staphylococcus as the cause of diseases classified elsewhere: Secondary | ICD-10-CM

## 2020-04-05 HISTORY — PX: COLONOSCOPY WITH PROPOFOL: SHX5780

## 2020-04-05 HISTORY — PX: ESOPHAGOGASTRODUODENOSCOPY (EGD) WITH PROPOFOL: SHX5813

## 2020-04-05 LAB — CBC
HCT: 27.1 % — ABNORMAL LOW (ref 36.0–46.0)
HCT: 25 % — ABNORMAL LOW (ref 36.0–46.0)
Hemoglobin: 8.6 g/dL — ABNORMAL LOW (ref 12.0–15.0)
Hemoglobin: 8.1 g/dL — ABNORMAL LOW (ref 12.0–15.0)
MCH: 29.7 pg (ref 26.0–34.0)
MCH: 30.1 pg (ref 26.0–34.0)
MCHC: 31.7 g/dL (ref 30.0–36.0)
MCHC: 32.4 g/dL (ref 30.0–36.0)
MCV: 93.4 fL (ref 80.0–100.0)
MCV: 92.9 fL (ref 80.0–100.0)
Platelets: 235 10*3/uL (ref 150–400)
Platelets: 219 10*3/uL (ref 150–400)
RBC: 2.9 MIL/uL — ABNORMAL LOW (ref 3.87–5.11)
RBC: 2.69 MIL/uL — ABNORMAL LOW (ref 3.87–5.11)
RDW: 14.5 % (ref 11.5–15.5)
RDW: 14.6 % (ref 11.5–15.5)
WBC: 20.7 10*3/uL — ABNORMAL HIGH (ref 4.0–10.5)
WBC: 16.3 10*3/uL — ABNORMAL HIGH (ref 4.0–10.5)
nRBC: 0.1 % (ref 0.0–0.2)
nRBC: 0.3 % — ABNORMAL HIGH (ref 0.0–0.2)

## 2020-04-05 LAB — BASIC METABOLIC PANEL
Anion gap: 9 (ref 5–15)
BUN: 26 mg/dL — ABNORMAL HIGH (ref 6–20)
CO2: 18 mmol/L — ABNORMAL LOW (ref 22–32)
Calcium: 8.4 mg/dL — ABNORMAL LOW (ref 8.9–10.3)
Chloride: 117 mmol/L — ABNORMAL HIGH (ref 98–111)
Creatinine, Ser: 0.64 mg/dL (ref 0.44–1.00)
GFR, Estimated: >60 mL/min (ref >60)
Glucose, Bld: 142 mg/dL — ABNORMAL HIGH (ref 70–99)
Potassium: 3.3 mmol/L — ABNORMAL LOW (ref 3.5–5.1)
Sodium: 144 mmol/L (ref 135–145)

## 2020-04-05 LAB — LACTIC ACID, PLASMA: Lactic Acid, Venous: 2.1 mmol/L (ref 0.5–1.9)

## 2020-04-05 LAB — PROCALCITONIN: Procalcitonin: <0.1 ng/mL

## 2020-04-05 SURGERY — ESOPHAGOGASTRODUODENOSCOPY (EGD) WITH PROPOFOL
Anesthesia: General

## 2020-04-05 MED ORDER — PROPOFOL 10 MG/ML IV BOLUS
INTRAVENOUS | Status: DC | PRN
  Administered 2020-04-05: 50 mg via INTRAVENOUS
  Administered 2020-04-05: 70 mg via INTRAVENOUS
  Administered 2020-04-05: 30 mg via INTRAVENOUS

## 2020-04-05 MED ORDER — SODIUM CHLORIDE 0.9 % IV SOLN: INTRAVENOUS | Status: DC

## 2020-04-05 MED ORDER — LIDOCAINE HCL (CARDIAC) PF 100 MG/5ML IV SOSY
PREFILLED_SYRINGE | INTRAVENOUS | Status: DC | PRN
  Administered 2020-04-05: 100 mg via INTRAVENOUS

## 2020-04-05 MED ORDER — GLYCOPYRROLATE 0.2 MG/ML IJ SOLN
INTRAMUSCULAR | Status: DC | PRN
  Administered 2020-04-05: .2 mg via INTRAVENOUS

## 2020-04-05 MED ORDER — BISACODYL 5 MG PO TBEC
10.0000 mg | DELAYED_RELEASE_TABLET | Freq: Once | ORAL | Status: AC
  Administered 2020-04-05: 10 mg via ORAL
  Filled 2020-04-05: qty 2

## 2020-04-05 MED ORDER — POLYETHYLENE GLYCOL 3350 17 GM/SCOOP PO POWD
1.0000 | Freq: Once | ORAL | Status: AC
  Administered 2020-04-05: 255 g via ORAL
  Filled 2020-04-05: qty 255

## 2020-04-05 MED ORDER — PROPOFOL 500 MG/50ML IV EMUL
INTRAVENOUS | Status: DC | PRN
  Administered 2020-04-05: 140 ug/kg/min via INTRAVENOUS

## 2020-04-05 MED ORDER — POTASSIUM CHLORIDE CRYS ER 20 MEQ PO TBCR
40.0000 meq | EXTENDED_RELEASE_TABLET | Freq: Once | ORAL | Status: AC
  Administered 2020-04-05: 40 meq via ORAL
  Filled 2020-04-05: qty 2

## 2020-04-05 MED ORDER — PHENYLEPHRINE HCL (PRESSORS) 10 MG/ML IV SOLN
INTRAVENOUS | Status: DC | PRN
  Administered 2020-04-05: 100 ug via INTRAVENOUS

## 2020-04-05 MED ORDER — PANTOPRAZOLE SODIUM 40 MG PO TBEC
40.0000 mg | DELAYED_RELEASE_TABLET | Freq: Every day | ORAL | Status: DC
  Administered 2020-04-06: 40 mg via ORAL
  Filled 2020-04-05 (×2): qty 1

## 2020-04-05 MED ORDER — CEFAZOLIN SODIUM-DEXTROSE 2-4 GM/100ML-% IV SOLN
2.0000 g | Freq: Three times a day (TID) | INTRAVENOUS | Status: DC
  Administered 2020-04-05 – 2020-04-06 (×2): 2 g via INTRAVENOUS
  Filled 2020-04-05 (×4): qty 100

## 2020-04-05 NOTE — Transfer of Care (Signed)
Immediate Anesthesia Transfer of Care Note  Patient: Kimberly Mcdonald  Procedure(s) Performed: ESOPHAGOGASTRODUODENOSCOPY (EGD) WITH PROPOFOL (N/A ) COLONOSCOPY WITH PROPOFOL (N/A )  Patient Location: PACU  Anesthesia Type:General  Level of Consciousness: sedated  Airway & Oxygen Therapy: Patient Spontanous Breathing  Post-op Assessment: Report given to RN and Post -op Vital signs reviewed and stable  Post vital signs: Reviewed and stable  Last Vitals:  Vitals Value Taken Time  BP 115/102 04/05/20 1359  Temp    Pulse 100 04/05/20 1402  Resp 21 04/05/20 1402  SpO2 98 % 04/05/20 1402  Vitals shown include unvalidated device data.  Last Pain:  Vitals:   04/05/20 1359  TempSrc:   PainSc: 0-No pain         Complications: No complications documented.

## 2020-04-05 NOTE — Op Note (Signed)
[facility] Gastroenterology Patient Name: Kimberly Mcdonald Procedure Date: 04/05/2020 1:11 PM MRN: [id number] Account #: [id number] Date of Birth: 1965-07-10 Admit Type: Inpatient Age: [age] [facility] ROOM 4 Gender: Female Note Status: Finalized Procedure:             Colonoscopy Indications:           Melena, Acute post hemorrhagic anemia Providers:             Boykin Nearingeodoro K. Norma Fredricksonoledo MD, MD Referring MD:          No Local Md, MD (Referring MD) Medicines:             Propofol per Anesthesia Complications:         No immediate complications. Procedure:             Pre-Anesthesia Assessment:                        - The risks and benefits of the procedure and the                         sedation options and risks were discussed with the                         patient. All questions were answered and informed                         consent was obtained.                        - Patient identification and proposed procedure were                         verified prior to the procedure by the nurse. The                         procedure was verified in the procedure room.                        - ASA Grade Assessment: III - A patient with severe                         systemic disease.                        - After reviewing the risks and benefits, the patient                         was deemed in satisfactory condition to undergo the                         procedure.                        After obtaining informed consent, the colonoscope was                         passed under direct vision. Throughout the procedure,                         the patient's blood pressure,  pulse, and oxygen                         saturations were monitored continuously. The                         Colonoscope was introduced through the anus and                         advanced to the the cecum, identified by appendiceal                         orifice and ileocecal valve. The  colonoscopy was                         performed without difficulty. The patient tolerated                         the procedure well. The quality of the bowel                         preparation was excellent. The ileocecal valve,                         appendiceal orifice, and rectum were photographed. Findings:      The perianal and digital rectal examinations were normal. Pertinent       negatives include normal sphincter tone and no palpable rectal lesions.      Non-bleeding internal hemorrhoids were found during retroflexion. The       hemorrhoids were Grade I (internal hemorrhoids that do not prolapse).      Many small and large-mouthed diverticula were found in the entire colon.       There was no evidence of diverticular bleeding.      There is no endoscopic evidence of bleeding, erythema, inflammation,       mass, polyps or ulcerations in the entire colon. Impression:            - Non-bleeding internal hemorrhoids.                        - Mild diverticulosis in the entire examined colon.                         There was no evidence of diverticular bleeding.                        - No specimens collected. Recommendation:        - Patient has a contact number available for                         emergencies. The signs and symptoms of potential                         delayed complications were discussed with the patient.                         Return to normal activities tomorrow. Written  discharge instructions were provided to the patient.                        - Resume previous diet.                        - Continue present medications.                        - Repeat colonoscopy in 10 years for screening                         purposes.                        - Use Protonix (pantoprazole) 40 mg PO daily.                        - The findings and recommendations were discussed with                         the patient.                        -  Return to my office in 6 weeks.                        - The findings and recommendations were discussed with                         the patient. Procedure Code(s):     --- Professional ---                        [phone], Colonoscopy, flexible; diagnostic, including                         collection of specimen(s) by brushing or washing, when                         performed (separate procedure) Diagnosis Code(s):     --- Professional ---                        K57.30, Diverticulosis of large intestine without                         perforation or abscess without bleeding                        D62, Acute posthemorrhagic anemia                        K92.1, Melena (includes Hematochezia)                        K64.0, First degree hemorrhoids CPT copyright 2019 American Medical Association. All rights reserved. The codes documented in this report are preliminary and upon coder review may  be revised to meet current compliance requirements. Stanton Kidney MD, MD 04/05/2020 1:59:38 PM This report has been signed electronically. Number of Addenda: 0 Note Initiated On: 04/05/2020 1:11 PM Scope Withdrawal Time: 0 hours 5 minutes 56  seconds  Total Procedure Duration: 0 hours 8 minutes 13 seconds  Estimated Blood Loss:  Estimated blood loss: none.      [facility]

## 2020-04-05 NOTE — Progress Notes (Signed)
Patient ID: Kimberly Mcdonald, female   DOB: 1965/12/03, 55 y.o.   MRN: [id number]  Blood culture 1/4 growing Staphylococcus lugdunensis. Patient switch to IV Ancef. Infectious disease consultation with Dr. Joylene Draft.

## 2020-04-05 NOTE — Anesthesia Preprocedure Evaluation (Addendum)
Anesthesia Evaluation  Patient identified by MRN, date of birth, ID band Patient awake    Reviewed: Allergy & Precautions, H&P , NPO status , reviewed documented beta blocker date and time   Airway Mallampati: III  TM Distance: >3 FB Neck ROM: full    Dental  (+) Caps, Missing   Pulmonary Current Smoker and Patient abstained from smoking.,    Pulmonary exam normal        Cardiovascular hypertension, Normal cardiovascular exam     Neuro/Psych CVA, No Residual Symptoms    GI/Hepatic   Endo/Other  Morbid obesity  Renal/GU      Musculoskeletal  (+) Arthritis ,   Abdominal   Peds  Hematology  (+) Blood dyscrasia, anemia ,   Anesthesia Other Findings Past Medical History: No date: Allergy     Comment:  seasonal 05/08/2019: Anemia due to blood loss No date: Arthritis     Comment:  knees and back No date: Cerebrovascular accident (CVA) (HCC) No date: HTN (hypertension) No date: Hyperlipidemia No date: Hypertension No date: Substance abuse (HCC)     Comment:  Marijuana smoker x35 yrs  Past Surgical History: 05/13/2019: ENDARTERECTOMY; Left     Comment:  Procedure: ENDARTERECTOMY CAROTID;  Surgeon: Renford Dills, MD;  Location: [facility] ORS;  Service: Vascular;                Laterality: Left; No date: TUBAL LIGATION  BMI    Body Mass Index: 41.01 kg/m      Reproductive/Obstetrics                            Anesthesia Physical Anesthesia Plan  ASA: III  Anesthesia Plan: General   Post-op Pain Management:    Induction: Intravenous  PONV Risk Score and Plan: Treatment may vary due to age or medical condition and TIVA  Airway Management Planned: Nasal Cannula and Natural Airway  Additional Equipment:   Intra-op Plan:   Post-operative Plan:   Informed Consent: I have reviewed the patients History and Physical, chart, labs and discussed the procedure including  the risks, benefits and alternatives for the proposed anesthesia with the patient or authorized representative who has indicated his/her understanding and acceptance.     Dental Advisory Given  Plan Discussed with: CRNA  Anesthesia Plan Comments:         Anesthesia Quick Evaluation

## 2020-04-05 NOTE — Progress Notes (Addendum)
[facility]  - [place] [facility]   PATIENT NAME: Kimberly Mcdonald    MR#:  [id number]  DATE OF BIRTH:  1965/10/31  SUBJECTIVE:  patient came in with generalized weakness and melena. Denies nausea vomiting or abdominal pain. Denies taking any aspirin or over-the-counter BC powder underwent overnight prep for colonoscopy.  REVIEW OF SYSTEMS:   Review of Systems  Constitutional: Negative for chills, fever and weight loss.  HENT: Negative for ear discharge, ear pain and nosebleeds.   Eyes: Negative for blurred vision, pain and discharge.  Respiratory: Negative for sputum production, shortness of breath, wheezing and stridor.   Cardiovascular: Negative for chest pain, palpitations, orthopnea and PND.  Gastrointestinal: Positive for melena. Negative for abdominal pain, diarrhea, nausea and vomiting.  Genitourinary: Negative for frequency and urgency.  Musculoskeletal: Positive for joint pain. Negative for back pain.  Neurological: Positive for weakness. Negative for sensory change, speech change and focal weakness.  Psychiatric/Behavioral: Negative for depression and hallucinations. The patient is not nervous/anxious.    Tolerating Diet: Tolerating PT:   DRUG ALLERGIES:  No Known Allergies  VITALS:  Blood pressure 114/61, pulse 100, temperature 98 F (36.7 C), resp. rate 20, height 5\' 3"  (1.6 m), weight 108.9 kg, last menstrual period 10/01/2016, SpO2 98 %.  PHYSICAL EXAMINATION:   Physical Exam  GENERAL:  55 y.o.-year-old patient lying in the bed with no acute distress. obese LUNGS: Normal breath sounds bilaterally, no wheezing, rales, rhonchi. No use of accessory muscles of respiration.  CARDIOVASCULAR: S1, S2 normal. No murmurs, rubs, or gallops.  ABDOMEN: Soft, nontender, nondistended. Bowel sounds present. No organomegaly or mass.  EXTREMITIES: No cyanosis, clubbing or edema b/l.    NEUROLOGIC: Cranial nerves II through XII are intact. No focal Motor or  sensory deficits b/l.   PSYCHIATRIC:  patient is alert and oriented x 3.  SKIN: No obvious rash, lesion, or ulcer.   LABORATORY PANEL:  CBC Recent Labs  Lab 04/05/20 0501  WBC 20.7*  HGB 8.6*  HCT 27.1*  PLT 235    Chemistries  Recent Labs  Lab 04/04/20 1056 04/05/20 0501  NA 143 144  K 3.8 3.3*  CL 116* 117*  CO2 16* 18*  GLUCOSE 159* 142*  BUN 66* 26*  CREATININE 0.87 0.64  CALCIUM 8.7* 8.4*  AST 16  --   ALT 8  --   ALKPHOS 74  --   BILITOT 0.7  --    Cardiac Enzymes No results for input(s): TROPONINI in the last 168 hours. RADIOLOGY:  CT Abdomen Pelvis W Contrast  Result Date: 04/04/2020 CLINICAL DATA:  Melena, generalized weakness, acute nonlocalized abdominal pain EXAM: CT ABDOMEN AND PELVIS WITH CONTRAST TECHNIQUE: Multidetector CT imaging of the abdomen and pelvis was performed using the standard protocol following bolus administration of intravenous contrast. CONTRAST:  04/06/2020 OMNIPAQUE IOHEXOL 300 MG/ML  SOLN COMPARISON:  None. FINDINGS: Lower chest: The visualized lung bases are clear bilaterally. The visualized heart and pericardium are unremarkable. Large hiatal hernia. Hepatobiliary: No focal liver abnormality is seen. No gallstones, gallbladder wall thickening, or biliary dilatation. Pancreas: Unremarkable Spleen: Unremarkable Adrenals/Urinary Tract: Adrenal glands are unremarkable. Kidneys are normal, without renal calculi, focal lesion, or hydronephrosis. Bladder is unremarkable. Stomach/Bowel: Mild sigmoid diverticulosis. The large and small bowel are otherwise unremarkable. Appendix normal. No free intraperitoneal gas or fluid. 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 Distally, the vessel  appears patent. The abdominal vasculature is otherwise unremarkable. No aortic aneurysm. No pathologic adenopathy within the abdomen and pelvis.  Reproductive: The endometrium appears thickened measuring up to 14 mm in thickness. Lobulated appearance of the uterine fundus likely relates to underlying uterine fibroids. The pelvic organs are otherwise unremarkable. Other: Tiny fat containing umbilical and small fat containing bilateral inguinal hernias are present. The rectum is unremarkable. Musculoskeletal: Degenerative changes are seen within the lumbar spine. No acute bone abnormality. No lytic or blastic bone lesions. IMPRESSION: No acute intra-abdominal pathology identified. No definite radiographic explanation for the patient's reported symptoms. Large hiatal hernia. 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 Inferior mesenteric artery and celiac axis appear widely patent, however. Thickening of the endometrium, abnormal in a postmenopausal patient. This should be confirmed with with dedicated sonography. In Electronically Signed   By: Helyn Numbers MD   On: 04/04/2020 15:27   ASSESSMENT AND PLAN:    Kimberly Mcdonald is a 55 y.o. 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 She also endorses acid reflux symptoms that has been worsening for the last month requiring her to take Tums nearly daily.  She also endorses that she takes iron tablets.  # Severe symptomatic normocytic anemia-suspect multifactorial including secondary to iron deficiency anemia and upper GI bleed Large Hiatal hernia -Hemoglobin on presentation is 8.3--1 unit BT--10.0--8.6 --baseline hemoglobin is 12.8-12.9 -GI, Dr. Horace Porteous input appreciated. 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 No emergent evaluation needed. Continue aspirin status and follow-up outpatient.  # Hypertension now normotensive without taking her blood pressure medicine for 2 days which may be to due to GI bleed -Amlodipine 10 mg daily   # History of left MCA-presumed secondary to left ICA stenosis; resumed atorvastatin 40 mg daily, holding aspirin  # CAD- holding home aspirin  # Hyperlipidemia-atorvastatin   # tobacco abuse - patient states she will quit now, nicotine patch prn ordered  # Morbid obesity - outpatient weight loss     DVT prophylaxis: TED hose, holding pharmacologic DVT prophylaxis due to severe acute anemia secondary to upper GI bleed Code Status: Full code Diet: N.p.o. Family Communication: No Disposition Plan: Pending clinical course Consults called: Gastroenterology, vascular Level of care: Progressive Cardiac Status is: Inpatient  Remains inpatient appropriate because:Inpatient level of care appropriate due to severity of illness   Dispo: The patient is from:  Home              Anticipated d/c is to: Home               Patient currently is not medically stable to d/c.   Difficult to place patient No        TOTAL TIME TAKING CARE OF THIS PATIENT: 30 minutes.  >50% time spent on counselling and coordination of care  Note: This dictation was prepared with Dragon dictation along with smaller phrase technology. Any transcriptional errors that result from this process are unintentional.  Enedina Finner M.D    [facility]   CC: Primary care physician; Rolm Gala, NPPatient ID: [name], female   DOB: 1965-11-10, 55 y.o.   MRN: [id number]

## 2020-04-05 NOTE — Op Note (Signed)
[facility] Gastroenterology Patient Name: Kimberly Mcdonald Procedure Date: 04/05/2020 1:12 PM MRN: [id number] Account #: [id number] Date of Birth: Dec 26, 1965 Admit Type: Inpatient Age: 55 Room: [facility] ENDO ROOM 4 Gender: Female Note Status: Finalized Procedure:             Upper GI endoscopy Indications:           Acute post hemorrhagic anemia, Melena Providers:             Boykin Nearing. Norma Fredrickson MD, MD Referring MD:          No Local Md, MD (Referring MD) Medicines:             Propofol per Anesthesia Complications:         No immediate complications. Procedure:             Pre-Anesthesia Assessment:                        - The risks and benefits of the procedure and the                         sedation options and risks were discussed with the                         patient. All questions were answered and informed                         consent was obtained.                        - Patient identification and proposed procedure were                         verified prior to the procedure by the nurse. The                         procedure was verified in the procedure room.                        - ASA Grade Assessment: III - A patient with severe                         systemic disease.                        - After reviewing the risks and benefits, the patient                         was deemed in satisfactory condition to undergo the                         procedure.                        After obtaining informed consent, the endoscope was                         passed under direct vision. Throughout the procedure,                         the patient's  blood pressure, pulse, and oxygen                         saturations were monitored continuously. The Endoscope                         was introduced through the mouth, and advanced to the                         third part of duodenum. The upper GI endoscopy was                         accomplished without  difficulty. The patient tolerated                         the procedure well. Findings:      A mild Schatzki ring was found in the distal esophagus.      A large hiatal hernia was present. Estimated blood loss: none.      Multiple dispersed erosions with no stigmata of recent bleeding were       found in the gastric fundus and in the gastric body. Biopsies were taken       with a cold forceps for Helicobacter pylori testing.      The examined duodenum was normal.      The exam was otherwise without abnormality. Impression:            - Mild Schatzki ring.                        - Large hiatal hernia.                        - Gastric erosions with no stigmata of recent                         bleeding. Biopsied.                        - Normal examined duodenum.                        - The examination was otherwise normal. Recommendation:        - Await pathology results.                        - Proceed with colonoscopy Procedure Code(s):     --- Professional ---                        [phone], Esophagogastroduodenoscopy, flexible,                         transoral; with biopsy, single or multiple Diagnosis Code(s):     --- Professional ---                        K92.1, Melena (includes Hematochezia)                        D62, Acute posthemorrhagic anemia  K25.9, Gastric ulcer, unspecified as acute or chronic,                         without hemorrhage or perforation                        K44.9, Diaphragmatic hernia without obstruction or                         gangrene                        K22.2, Esophageal obstruction CPT copyright 2019 [employer]. All rights reserved. The codes documented in this report are preliminary and upon coder review may  be revised to meet current compliance requirements. Stanton Kidney MD, MD 04/05/2020 1:41:29 PM This report has been signed electronically. Number of Addenda: 0 Note Initiated On: 04/05/2020  1:12 PM Estimated Blood Loss:  Estimated blood loss: none.      [facility]

## 2020-04-05 NOTE — Consult Note (Signed)
NAME: Kimberly Mcdonald  DOB: 1965-05-03  MRN: [id number]  Date/Time: 04/05/2020 2:59 PM  REQUESTING PROVIDER: Dr. Allena Katz Subjective:  REASON FOR CONSULT: Staph lugdunensis bacteremia ? Kimberly Mcdonald is a 55 y.o. female with a history of CVA, status post carotid endarterectomy on the left side, hyperlipidemia, hypertension arthritis of her knees presented to the ED from home complaining of black tarry stools of a few days day duration.  Patient had stopped taking iron 2 days prior to that and it was still black..  She was also having generalized weakness. In the ED vitals BP 118/71, temp 97.9, pulse 114, sats 99%, respiratory rate 18. WBC 22.6, Hb 10, PLT 254, creatinine 0.87. Blood culture sent.  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  The inferior mesenteric artery and celiac axis appeared widely patent.  There was thickening of the endometrium which was abnormal in a postmenopausal patient.She was seen by GI underwent upper endoscopy which showed a mild Schatzki ring in the distal esophagus.  A large hiatal hernia was present.  There were multiple erosions in the gastric body.  There was no obvious bleeding noted.  The rest of the examination was normal.  She also had colonoscopy which showed small and large mouth diverticula in the entire colon.  There was no evidence of diverticular bleeding. As one of the blood cultures came back positive for staph lugdunensis I am seeing the patient. She does not have any fever, skin wounds, headache, hardware  Past Medical History:  Diagnosis Date  . Allergy    seasonal  . Anemia due to blood loss 05/08/2019  . Arthritis    knees and back  . Cerebrovascular accident (CVA) (HCC)   . HTN (hypertension)   . Hyperlipidemia   . Hypertension   . Substance abuse (HCC)    Marijuana smoker x35 yrs     Past Surgical History:  Procedure Laterality Date  . ENDARTERECTOMY Left 05/13/2019   Procedure: ENDARTERECTOMY CAROTID;  Surgeon: Renford Dills, MD;  Location: [facility] ORS;  Service: Vascular;  Laterality: Left;  . TUBAL LIGATION      Social History   Socioeconomic History  . Marital status: Legally Separated    Spouse name: Not on file  . Number of children: 3  . Years of education: Not on file  . Highest education level: Not on file  Occupational History  . Occupation: unemployed  Tobacco Use  . Smoking status: Current Some Day Smoker    Packs/day: 0.70    Types: Cigarettes    Last attempt to quit: 05/08/2019    Years since quitting: 0.9  . Smokeless tobacco: Never Used  . Tobacco comment: only 2 cigarettes since surgery a month ago  Vaping Use  . Vaping Use: Never used  Substance and Sexual Activity  . Alcohol use: Not Currently    Alcohol/week: 3.0 standard drinks    Types: 3 Cans of beer per week    Comment: have not been drinking since surgery  . Drug use: Yes    Frequency: 3.0 times per week    Types: Marijuana    Comment: unsure exactly of use  . Sexual activity: Not Currently    Birth control/protection: None  Other Topics Concern  . Not on file  Social History Narrative   Right handed   Drinks caffeine   One story  home   Social Determinants of Health   Financial Resource Strain: Not on file  Food Insecurity: Not on file  Transportation Needs: Unknown  . Lack of Transportation (Medical): No  . Lack of Transportation (Non-Medical): Not on file  Physical Activity: Unknown  . Days of Exercise per Week: 0 days  . Minutes of Exercise per Session: Not on file  Stress: Not on file  Social Connections: Not on file  Intimate Partner Violence: Unknown  . Fear of Current or Ex-Partner: No  . Emotionally Abused: Not on file  . Physically Abused: Not on file  . Sexually Abused: Not on file    Family History  Problem Relation Age of Onset  . Diabetes  Mother   . Hypertension Mother   . Cancer Father   . Alcohol abuse Sister    No Known Allergies I? Current Facility-Administered Medications  Medication Dose Route Frequency Provider Last Rate Last Admin  . acetaminophen (TYLENOL) tablet 325 mg  325 mg Oral Q6H PRN Cox, Amy N, DO       Or  . acetaminophen (TYLENOL) suppository 325 mg  325 mg Rectal Q6H PRN Cox, Amy N, DO      . amLODipine (NORVASC) tablet 10 mg  10 mg Oral Daily Cox, Amy N, DO      . atorvastatin (LIPITOR) tablet 40 mg  40 mg Oral Daily Cox, Amy N, DO   40 mg at 04/04/20 2153  . ceFAZolin (ANCEF) IVPB 2g/100 mL premix  2 g Intravenous Hazle Quant, MD      . cholecalciferol (VITAMIN D3) tablet 1,000 Units  1,000 Units Oral Daily Cox, Amy N, DO   1,000 Units at 04/04/20 2153  . nicotine (NICODERM CQ - dosed in mg/24 hr) patch 7 mg  7 mg Transdermal Daily PRN Cox, Amy N, DO      . ondansetron (ZOFRAN) tablet 4 mg  4 mg Oral Q6H PRN Cox, Amy N, DO       Or  . ondansetron (ZOFRAN) injection 4 mg  4 mg Intravenous Q6H PRN Cox, Amy N, DO      . pantoprazole (PROTONIX) EC tablet 40 mg  40 mg Oral Daily [place], [place] K, MD      . potassium chloride SA (KLOR-CON) CR tablet 40 mEq  40 mEq Oral Once Ronnald Ramp, RPH         Abtx:  Anti-infectives (From admission, onward)   Start     Dose/Rate Route Frequency Ordered Stop   04/05/20 1415  ceFAZolin (ANCEF) IVPB 2g/100 mL premix        2 g 200 mL/hr over 30 Minutes Intravenous Every 8 hours 04/05/20 1411     04/04/20 2200  piperacillin-tazobactam (ZOSYN) IVPB 3.375 g  Status:  Discontinued        3.375 g 12.5 mL/hr over 240 Minutes Intravenous Every 8 hours 04/04/20 1749 04/05/20 1411   04/04/20 1515  piperacillin-tazobactam (ZOSYN) IVPB 3.375 g        3.375 g 100 mL/hr over 30 Minutes Intravenous  Once 04/04/20 1506 04/04/20 1605      REVIEW OF SYSTEMS:  Const: negative fever, negative chills, negative weight loss Eyes: negative diplopia or visual changes,  negative eye pain ENT: negative coryza, negative sore throat Resp: negative cough, hemoptysis, dyspnea Cards: negative for chest pain, palpitations, lower extremity edema GU: negative for frequency, dysuria and hematuria GI: as above Skin: negative for rash and pruritus Heme: negative for easy bruising and  gum/nose bleeding MS: knee pain b/l Neurolo:negative for headaches, dizziness, vertigo, memory problems  Psych: negative for feelings of anxiety, depression  Endocrine: negative for thyroid, diabetes Allergy/Immunology- negative for any medication or food allergies ? Pertinent Positives include : Objective:  VITALS:  BP 123/69 (BP Location: Right Arm)   Pulse (!) 101   Temp 98.1 F (36.7 C)   Resp 17   Ht 5\' 3"  (1.6 m)   Wt 108.9 kg   LMP 10/01/2016 (Approximate)   SpO2 100%   BMI 42.51 kg/m  PHYSICAL EXAM:  General: Alert, cooperative, no distress, appears stated age.  Head: Normocephalic, without obvious abnormality, atraumatic. Eyes: Conjunctivae clear, anicteric sclerae. Pupils are equal ENT Nares normal. No drainage or sinus tenderness. Lips, mucosa, and tongue normal. No Thrush Neck: Supple, symmetrical, no adenopathy, thyroid: non tender no carotid bruit and no JVD. Back: No CVA tenderness. Lungs: Clear to auscultation bilaterally. No Wheezing or Rhonchi. No rales. Heart: Regular rate and rhythm, no murmur, rub or gallop. Abdomen: Soft, non-tender,not distended. Bowel sounds normal. No masses Extremities: atraumatic, no cyanosis. No edema. No clubbing Skin: No rashes or lesions. Or bruising Lymph: Cervical, supraclavicular normal. Neurologic: Grossly non-focal Pertinent Labs Lab Results CBC    Component Value Date/Time   WBC 20.7 (H) 04/05/2020 0501   RBC 2.90 (L) 04/05/2020 0501   HGB 8.6 (L) 04/05/2020 0501   HGB 12.9 02/29/2020 1045   HCT 27.1 (L) 04/05/2020 0501   HCT 41.2 02/29/2020 1045   PLT 235 04/05/2020 0501   PLT 388 02/29/2020 1045   MCV  93.4 04/05/2020 0501   MCV 89 02/29/2020 1045   MCH 29.7 04/05/2020 0501   MCHC 31.7 04/05/2020 0501   RDW 14.5 04/05/2020 0501   RDW 12.7 02/29/2020 1045   LYMPHSABS 4.1 (H) 04/04/2020 1948   LYMPHSABS 2.0 02/29/2020 1045   MONOABS 1.6 (H) 04/04/2020 1948   EOSABS 0.0 04/04/2020 1948   EOSABS 0.0 02/29/2020 1045   BASOSABS 0.1 04/04/2020 1948   BASOSABS 0.1 02/29/2020 1045    CMP Latest Ref Rng & Units 04/05/2020 04/04/2020 02/29/2020  Glucose 70 - 99 mg/dL 295(M142(H) 841(L159(H) 93  BUN 6 - 20 mg/dL 24(M26(H) 01(U66(H) 8  Creatinine 0.44 - 1.00 mg/dL 2.720.64 5.360.87 6.440.62  Sodium 135 - 145 mmol/L 144 143 141  Potassium 3.5 - 5.1 mmol/L 3.3(L) 3.8 3.7  Chloride 98 - 111 mmol/L 117(H) 116(H) 104  CO2 22 - 32 mmol/L 18(L) 16(L) 22  Calcium 8.9 - 10.3 mg/dL 0.3(K8.4(L) 7.4(Q8.7(L) 9.3  Total Protein 6.5 - 8.1 g/dL - 6.9 7.5  Total Bilirubin 0.3 - 1.2 mg/dL - 0.7 1.0  Alkaline Phos 38 - 126 U/L - 74 128(H)  AST 15 - 41 U/L - 16 13  ALT 0 - 44 U/L - 8 8      Microbiology: Recent Results (from the past 240 hour(s))  Culture, blood (routine x 2)     Status: None (Preliminary result)   Collection Time: 04/04/20  3:12 PM   Specimen: BLOOD  Result Value Ref Range Status   Specimen Description BLOOD BLOOD RIGHT ARM  Final   Special Requests   Final    BOTTLES DRAWN AEROBIC AND ANAEROBIC Blood Culture adequate volume   Culture  Setup Time   Final    Organism ID to follow ANAEROBIC BOTTLE ONLY GRAM POSITIVE COCCI CRITICAL RESULT CALLED TO, READ BACK BY AND VERIFIED WITH: SUSAN WATSON 04/05/20 1457 KLW    Culture   Final  NO GROWTH < 24 HOURS Performed at [facility], [address] [place], [place] [address]    Report Status PENDING  Incomplete  Blood Culture ID Panel (Reflexed)     Status: Abnormal   Collection Time: 04/04/20  3:12 PM  Result Value Ref Range Status   Enterococcus faecalis NOT DETECTED NOT DETECTED Final   Enterococcus Faecium NOT DETECTED NOT DETECTED Final   Listeria  monocytogenes NOT DETECTED NOT DETECTED Final   Staphylococcus species DETECTED (A) NOT DETECTED Final    Comment: CRITICAL RESULT CALLED TO, READ BACK BY AND VERIFIED WITH: SUSAN WATSON 04/05/20 1457 KLW    Staphylococcus aureus (BCID) NOT DETECTED NOT DETECTED Final   Staphylococcus epidermidis NOT DETECTED NOT DETECTED Final   Staphylococcus lugdunensis DETECTED (A) NOT DETECTED Final   Streptococcus species NOT DETECTED NOT DETECTED Final   Streptococcus agalactiae NOT DETECTED NOT DETECTED Final   Streptococcus pneumoniae NOT DETECTED NOT DETECTED Final   Streptococcus pyogenes NOT DETECTED NOT DETECTED Final   A.calcoaceticus-baumannii NOT DETECTED NOT DETECTED Final   Bacteroides fragilis NOT DETECTED NOT DETECTED Final   Enterobacterales NOT DETECTED NOT DETECTED Final   Enterobacter cloacae complex NOT DETECTED NOT DETECTED Final   Escherichia coli NOT DETECTED NOT DETECTED Final   Klebsiella aerogenes NOT DETECTED NOT DETECTED Final   Klebsiella oxytoca NOT DETECTED NOT DETECTED Final   Klebsiella pneumoniae NOT DETECTED NOT DETECTED Final   Proteus species NOT DETECTED NOT DETECTED Final   Salmonella species NOT DETECTED NOT DETECTED Final   Serratia marcescens NOT DETECTED NOT DETECTED Final   Haemophilus influenzae NOT DETECTED NOT DETECTED Final   Neisseria meningitidis NOT DETECTED NOT DETECTED Final   Pseudomonas aeruginosa NOT DETECTED NOT DETECTED Final   Stenotrophomonas maltophilia NOT DETECTED NOT DETECTED Final   Candida albicans NOT DETECTED NOT DETECTED Final   Candida auris NOT DETECTED NOT DETECTED Final   Candida glabrata NOT DETECTED NOT DETECTED Final   Candida krusei NOT DETECTED NOT DETECTED Final   Candida parapsilosis NOT DETECTED NOT DETECTED Final   Candida tropicalis NOT DETECTED NOT DETECTED Final   Cryptococcus neoformans/gattii NOT DETECTED NOT DETECTED Final   Methicillin resistance mecA/C NOT DETECTED NOT DETECTED Final    Comment: Performed  at [facility], [address] [place], [place] [address]  Culture, blood (routine x 2)     Status: None (Preliminary result)   Collection Time: 04/04/20  3:32 PM   Specimen: BLOOD  Result Value Ref Range Status   Specimen Description BLOOD BLOOD RIGHT HAND  Final   Special Requests   Final    BOTTLES DRAWN AEROBIC AND ANAEROBIC Blood Culture results may not be optimal due to an inadequate volume of blood received in culture bottles   Culture   Final    NO GROWTH < 24 HOURS Performed at [facility], [address] [place], [place] [address]    Report Status PENDING  Incomplete  SARS CORONAVIRUS 2 (TAT 6-24 HRS) Nasopharyngeal Nasopharyngeal Swab     Status: None   Collection Time: 04/04/20  4:03 PM   Specimen: Nasopharyngeal Swab  Result Value Ref Range Status   SARS Coronavirus 2 NEGATIVE NEGATIVE Final    Comment: (NOTE) SARS-CoV-2 target nucleic acids are NOT DETECTED.  The SARS-CoV-2 RNA is generally detectable in upper and lower respiratory specimens during the acute phase of infection. Negative results do not preclude SARS-CoV-2 infection, do not rule out co-infections with other pathogens, and should not be used as  the sole basis for treatment or other patient management decisions. Negative results must be combined with clinical observations, patient history, and epidemiological information. The expected result is Negative.  Fact Sheet for Patients: HairSlick.no  Fact Sheet for Healthcare Providers: quierodirigir.com  This test is not yet approved or cleared by the [place] FDA and  has been authorized for detection and/or diagnosis of SARS-CoV-2 by FDA under an Emergency Use Authorization (EUA). This EUA will remain  in effect (meaning this test can be used) for the duration of the COVID-19 declaration under Se ction 564(b)(1) of the Act, 21 U.S.C. section 360bbb-3(b)(1), unless the  authorization is terminated or revoked sooner.  Performed at [facility], [address] [place], [place] [address]    Micro  staph lugdunensis in  anaerobic bottle of 1 set .  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 I have personally reviewed the films ? Impression/Recommendation GI bleed : upper GI- is on aspirin for CVA-  Status post upper endoscopy and colonoscopy.  Multiple gastric erosions seen but none with bleeding.  Staph lugdunensis bacteremia 1 out of 4.  Low bioburden. 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  Status post carotid endarterectomy. Currently on atorvastatin.  Aspirin on hold.  Hypertension was taking amlodipine.  On admission she was normotensive and amlodipine was on hold for 2 days. ? ? ___________________________________________________ Discussed with patient, requesting provider Note:  This document was prepared using Dragon voice recognition software and may include unintentional dictation errors.

## 2020-04-05 NOTE — Interval H&P Note (Signed)
History and Physical Interval Note:  04/05/2020 1:07 PM  Kimberly Mcdonald  has presented today for surgery, with the diagnosis of Melena, anemia.  The various methods of treatment have been discussed with the patient and family. After consideration of risks, benefits and other options for treatment, the patient has consented to  Procedure(s): ESOPHAGOGASTRODUODENOSCOPY (EGD) WITH PROPOFOL (N/A) COLONOSCOPY WITH PROPOFOL (N/A) as a surgical intervention.  The patient's history has been reviewed, patient examined, no change in status, stable for surgery.  I have reviewed the patient's chart and labs.  Questions were answered to the patient's satisfaction.     [place], [place]

## 2020-04-05 NOTE — Consult Note (Signed)
[facility] Vascular Consult Note  MRN : [id number]  Kimberly Mcdonald is a 55 y.o. (03-Mar-1965) female who presents with chief complaint of  Chief Complaint  Patient presents with  . 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  She reports that the black stool has been worsening with multiple black bowel movements for the last 2 days. She reports that she has been so weak she has not been out of bed to eat for 2 days thus prompting her to present to the emergency department for further evaluation.  She reports that the weakness is worse with exertion. She also endorses shortness of breath that is new with exertion. Denies bilateral lower extremity swelling.  She also endorses acid reflux symptoms that has been worsening for the last month requiring her to take Tums nearly daily.  She also endorses that she takes iron tablets.   She denies fever, chest pain, abdominal pain, postprandial abdominal pain, nausea, vomiting, dysuria, hematuria, vaginal bleeding.  She denies changes to her diet.  She denies sick contacts.  She reports that she is not taken her prescribed home medications for 2 days due to the profound generalized weakness. She reports that this is similar to the time when she had GI bleed in May 2021 which she received 4 units of blood.  On CT A/P with contrast dated April 04, 2020: A focal dissection of the proximal superior mesenteric artery versus eccentric plaque results in a minimal 50% stenosis of this vessel in this region. Distally,  the vessel appears patent. The abdominal vasculature is otherwise unremarkable. No aortic aneurysm. No pathologic adenopathy within the abdomen and pelvis.  Vascular surgery was consulted by Dr. Allena Katz in setting of mesenteric stenosis.  Current Facility-Administered Medications  Medication Dose Route Frequency Provider Last Rate Last Admin  . 0.9 %  sodium chloride infusion   Intravenous Continuous Enedina Finner, MD 75 mL/hr at 04/05/20 1055 Rate Change at 04/05/20 1055  . acetaminophen (TYLENOL) tablet 325 mg  325 mg Oral Q6H PRN Cox, Amy N, DO       Or  . acetaminophen (TYLENOL) suppository 325 mg  325 mg Rectal Q6H PRN Cox, Amy N, DO      . amLODipine (NORVASC) tablet 10 mg  10 mg Oral Daily Cox, Amy N, DO      . atorvastatin (LIPITOR) tablet 40 mg  40 mg Oral Daily Cox, Amy N, DO   40 mg at 04/04/20 2153  . cholecalciferol (VITAMIN D3) tablet 1,000 Units  1,000 Units Oral Daily Cox, Amy N, DO   1,000 Units at 04/04/20 2153  . nicotine (NICODERM CQ - dosed in mg/24 hr) patch 7 mg  7 mg Transdermal Daily PRN Cox, Amy N, DO      . ondansetron (ZOFRAN) tablet 4 mg  4 mg Oral Q6H PRN Cox, Amy N, DO       Or  . ondansetron (ZOFRAN) injection 4 mg  4 mg Intravenous Q6H PRN Cox, Amy N, DO      . pantoprazole (PROTONIX) 80 mg  in sodium chloride 0.9 % 100 mL (0.8 mg/mL) infusion  8 mg/hr Intravenous Continuous Cox, Amy N, DO 10 mL/hr at 04/05/20 0433 8 mg/hr at 04/05/20 0433  . [START ON 04/08/2020] pantoprazole (PROTONIX) injection 40 mg  40 mg Intravenous Q12H Cox, Amy N, DO      . piperacillin-tazobactam (ZOSYN) IVPB 3.375 g  3.375 g Intravenous Q8H Rauer, Samantha O, RPH 12.5 mL/hr at 04/05/20 0552 3.375 g at 04/05/20 2423   Past Medical History:  Diagnosis Date  . Allergy    seasonal  . Anemia due to blood loss 05/08/2019  . Arthritis    knees and back  . Cerebrovascular accident (CVA) (HCC)   . HTN (hypertension)   . Hyperlipidemia   . Hypertension   . Substance abuse (HCC)    Marijuana  smoker x35 yrs   Past Surgical History:  Procedure Laterality Date  . ENDARTERECTOMY Left 05/13/2019   Procedure: ENDARTERECTOMY CAROTID;  Surgeon: Renford Dills, MD;  Location: [facility] ORS;  Service: Vascular;  Laterality: Left;  . TUBAL LIGATION     Social History Social History   Tobacco Use  . Smoking status: Current Some Day Smoker    Packs/day: 0.70    Types: Cigarettes    Last attempt to quit: 05/08/2019    Years since quitting: 0.9  . Smokeless tobacco: Never Used  . Tobacco comment: only 2 cigarettes since surgery a month ago  Vaping Use  . Vaping Use: Never used  Substance Use Topics  . Alcohol use: Not Currently    Alcohol/week: 3.0 standard drinks    Types: 3 Cans of beer per week    Comment: have not been drinking since surgery  . Drug use: Yes    Frequency: 3.0 times per week    Types: Marijuana    Comment: unsure exactly of use   Family History Family History  Problem Relation Age of Onset  . Diabetes Mother   . Hypertension Mother   . Cancer Father   . Alcohol abuse Sister    No Known Allergies  REVIEW OF SYSTEMS (Negative unless checked)  Constitutional: [] Weight loss  [] Fever  [] Chills Cardiac: [] Chest pain   [] Chest pressure   [] Palpitations   [] Shortness of breath when laying flat   [] Shortness of breath at rest   [] Shortness of breath with exertion. Vascular:  [] Pain in legs with walking   [] Pain in legs at rest   [] Pain in legs when laying flat   [] Claudication   [] Pain in feet when walking  [] Pain in feet at rest  [] Pain in feet when laying flat   [] History of DVT   [] Phlebitis   [] Swelling in legs   [] Varicose veins   [] Non-healing ulcers Pulmonary:   [] Uses home oxygen   [] Productive cough   [] Hemoptysis   [] Wheeze  [] COPD   [] Asthma Neurologic:  [] Dizziness  [] Blackouts   [] Seizures   [x] History of stroke   [] History of TIA  [] Aphasia   [] Temporary blindness   [] Dysphagia   [] Weakness or numbness in arms   [] Weakness or numbness in  legs Musculoskeletal:  [] Arthritis   [] Joint swelling   [] Joint pain   [] Low back pain Hematologic:  [] Easy bruising  [] Easy bleeding   [] Hypercoagulable state   [] Anemic  [] Hepatitis Gastrointestinal:  [x] Blood in stool   [] Vomiting blood  [] Gastroesophageal reflux/heartburn   [] Difficulty swallowing. Genitourinary:  [] Chronic kidney disease   [] Difficult urination  [] Frequent urination  [] Burning with urination   [] Blood in urine  Skin:  [] Rashes   [] Ulcers   [] Wounds Psychological:  [] History of anxiety   []  History of major depression.  Physical Examination  Vitals:   04/04/20 2141 04/04/20 2348 04/05/20 0403 04/05/20 0900  BP: (!) 147/79 118/71 127/78   Pulse: (!) 106 (!) 114 (!) 116   Resp: 20 18 20 20   Temp: 98.6 F (37 C) 97.9 F (36.6 C) 98.2 F (36.8 C)   TempSrc: Oral Axillary Oral   SpO2: 100% 99% 99%   Weight: 105 kg     Height:       Body mass index is 41.01 kg/m. Gen:  WD/WN, NAD Head: [place]/AT, No temporalis wasting. Prominent temp pulse not noted. Ear/Nose/Throat: Hearing grossly intact, nares w/o erythema or drainage, oropharynx w/o Erythema/Exudate Eyes: Sclera non-icteric, conjunctiva clear Neck: Trachea midline.  No JVD.  Pulmonary:  Good air movement, respirations not labored, equal bilaterally.  Cardiac: RRR, normal S1, S2. Vascular:  Vessel Right Left  Radial Palpable Palpable  Ulnar Palpable Palpable                               Gastrointestinal: soft, non-tender/non-distended. No guarding/reflex.  Musculoskeletal: M/S 5/5 throughout.  Extremities without ischemic changes.  No deformity or atrophy. No edema. Neurologic: Sensation grossly intact in extremities.  Symmetrical.  Speech is fluent. Motor exam as listed above. Psychiatric: Judgment intact, Mood & affect appropriate for pt's clinical situation. Dermatologic: No rashes or ulcers noted.  No cellulitis or open wounds. Lymph : No Cervical, Axillary, or Inguinal lymphadenopathy.  CBC Lab  Results  Component Value Date   WBC 20.7 (H) 04/05/2020   HGB 8.6 (L) 04/05/2020   HCT 27.1 (L) 04/05/2020   MCV 93.4 04/05/2020   PLT 235 04/05/2020   BMET    Component Value Date/Time   NA 144 04/05/2020 0501   NA 141 02/29/2020 1045   K 3.3 (L) 04/05/2020 0501   CL 117 (H) 04/05/2020 0501   CO2 18 (L) 04/05/2020 0501   GLUCOSE 142 (H) 04/05/2020 0501   BUN 26 (H) 04/05/2020 0501   BUN 8 02/29/2020 1045   CREATININE 0.64 04/05/2020 0501   CALCIUM 8.4 (L) 04/05/2020 0501   GFRNONAA >60 04/05/2020 0501   GFRAA 117 02/29/2020 1045   Estimated Creatinine Clearance: 91 mL/min (by C-G formula based on SCr of 0.64 mg/dL).  COAG Lab Results  Component Value Date   INR 1.1 04/04/2020   INR 1.1 05/13/2019   INR 1.0 05/08/2019   Radiology CT Abdomen Pelvis W Contrast  Result Date: 04/04/2020 CLINICAL DATA:  Melena, generalized weakness, acute nonlocalized abdominal pain EXAM: CT ABDOMEN AND PELVIS WITH CONTRAST TECHNIQUE: Multidetector CT imaging of the abdomen and pelvis was performed using the standard protocol following bolus administration of intravenous contrast. CONTRAST:  04/07/2020 OMNIPAQUE IOHEXOL 300 MG/ML  SOLN COMPARISON:  None. FINDINGS: Lower chest: The visualized lung bases are clear bilaterally. The visualized heart and pericardium are unremarkable. Large hiatal hernia. Hepatobiliary: No focal liver abnormality is seen. No gallstones, gallbladder wall thickening, or biliary dilatation. Pancreas: Unremarkable Spleen: Unremarkable Adrenals/Urinary Tract: Adrenal glands are unremarkable. Kidneys are normal, without renal calculi, focal lesion, or hydronephrosis. Bladder is unremarkable. Stomach/Bowel: Mild sigmoid diverticulosis. The large and small bowel are otherwise unremarkable. Appendix normal. No free intraperitoneal gas or fluid. Vascular/Lymphatic: A focal dissection of the proximal superior mesenteric artery versus eccentric plaque results in a minimal 50% stenosis of  this vessel in  this region. Distally, the vessel appears patent. The abdominal vasculature is otherwise unremarkable. No aortic aneurysm. No pathologic adenopathy within the abdomen and pelvis. Reproductive: The endometrium appears thickened measuring up to 14 mm in thickness. Lobulated appearance of the uterine fundus likely relates to underlying uterine fibroids. The pelvic organs are otherwise unremarkable. Other: Tiny fat containing umbilical and small fat containing bilateral inguinal hernias are present. The rectum is unremarkable. Musculoskeletal: Degenerative changes are seen within the lumbar spine. No acute bone abnormality. No lytic or blastic bone lesions. IMPRESSION: No acute intra-abdominal pathology identified. No definite radiographic explanation for the patient's reported symptoms. Large hiatal hernia. 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 Inferior mesenteric artery and celiac axis appear widely patent, however. Thickening of the endometrium, abnormal in a postmenopausal patient. This should be confirmed with with dedicated sonography. 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  1. Superior Mesenteric Artery Stenosis: CT abdomen and pelvis with contrast dated April 04, 2020 notable for: A focal dissection of the proximal superior mesenteric artery versus eccentric plaque results in a minimal 50% stenosis of this vessel in this region. Distally, the vessel appears  patent. The abdominal vasculature is otherwise unremarkable. No aortic aneurysm. No pathologic adenopathy within the abdomen and pelvis.   Reviewed images Dr. Wyn Quakerew - greater than 50% narrowing of the SMA.  Today, the patient denies any abdominal pain, postprandial pain, nausea or vomiting.  No emergent/acute intervention is indicated at this time.  We will continue to surveilled the patient's mesenteric stenosis in the outpatient setting.  Continue aspirin and statin for medical management  2.  Carotid stenosis status post left carotid endarterectomy (05/12/20). Patient was seen on May 26, 2019 postoperatively.  No recent follow-up.  Had a long conversation in regard to the need for continued follow-up.  3.  Tobacco abuse: We had a discussion for approximately three minutes regarding the absolute need for smoking cessation due to the deleterious nature of tobacco on the vascular system. We discussed the tobacco use would diminish patency of any intervention, and likely significantly worsen progression of disease. We discussed multiple agents for quitting including replacement therapy or medications to reduce cravings such as Chantix. The patient voices their understanding of the importance of smoking cessation.  Discussed with Dr. Weldon Inchesew  KIMBERLY A STEGMAYER, PA-C  04/05/2020 10:57 AM  This note was created with Dragon medical transcription system.  Any error is purely unintentional

## 2020-04-06 ENCOUNTER — Inpatient Hospital Stay (HOSPITAL_COMMUNITY)
Admit: 2020-04-06 | Discharge: 2020-04-06 | Disposition: A | Discharge status: Home / Self Care | Payer: Self-pay | Attending: Infectious Diseases | Admitting: Infectious Diseases

## 2020-04-06 ENCOUNTER — Encounter: Payer: Self-pay | Admitting: Internal Medicine

## 2020-04-06 DIAGNOSIS — R7881 Bacteremia: Secondary | ICD-10-CM

## 2020-04-06 LAB — ECHOCARDIOGRAM COMPLETE
AR max vel: 2.28 cm2
AV Area VTI: 2.58 cm2
AV Area mean vel: 2.18 cm2
AV Mean grad: 6 mmHg
AV Peak grad: 11.2 mmHg
Ao pk vel: 1.67 m/s
Area-P 1/2: 4.21 cm2
Height: 63 in
MV VTI: 2.15 cm2
S' Lateral: 3.4 cm
Weight: 3817.6 oz

## 2020-04-06 LAB — PREPARE RBC (CROSSMATCH)

## 2020-04-06 LAB — CBC
HCT: 21.7 % — ABNORMAL LOW (ref 36.0–46.0)
Hemoglobin: 7 g/dL — ABNORMAL LOW (ref 12.0–15.0)
MCH: 30 pg (ref 26.0–34.0)
MCHC: 32.3 g/dL (ref 30.0–36.0)
MCV: 93.1 fL (ref 80.0–100.0)
Platelets: 214 10*3/uL (ref 150–400)
RBC: 2.33 MIL/uL — ABNORMAL LOW (ref 3.87–5.11)
RDW: 14.6 % (ref 11.5–15.5)
WBC: 13.8 10*3/uL — ABNORMAL HIGH (ref 4.0–10.5)
nRBC: 0.4 % — ABNORMAL HIGH (ref 0.0–0.2)

## 2020-04-06 LAB — BLOOD CULTURE ID PANEL (REFLEXED) - BCID2

## 2020-04-06 LAB — HEMOGLOBIN AND HEMATOCRIT, BLOOD
HCT: 24.9 % — ABNORMAL LOW (ref 36.0–46.0)
Hemoglobin: 8.2 g/dL — ABNORMAL LOW (ref 12.0–15.0)

## 2020-04-06 LAB — POTASSIUM: Potassium: 3.4 mmol/L — ABNORMAL LOW (ref 3.5–5.1)

## 2020-04-06 MED ORDER — POTASSIUM CHLORIDE CRYS ER 20 MEQ PO TBCR
40.0000 meq | EXTENDED_RELEASE_TABLET | Freq: Once | ORAL | Status: AC
  Administered 2020-04-06: 40 meq via ORAL
  Filled 2020-04-06: qty 2

## 2020-04-06 MED ORDER — SODIUM CHLORIDE 0.9% IV SOLUTION: Freq: Once | INTRAVENOUS | Status: AC

## 2020-04-06 MED ORDER — ORITAVANCIN DIPHOSPHATE 400 MG IV SOLR
1200.0000 mg | Freq: Once | INTRAVENOUS | Status: AC
  Administered 2020-04-06: 1200 mg via INTRAVENOUS
  Filled 2020-04-06: qty 120

## 2020-04-06 MED ORDER — DIPHENHYDRAMINE HCL 25 MG PO CAPS
25.0000 mg | ORAL_CAPSULE | Freq: Three times a day (TID) | ORAL | Status: DC | PRN
  Administered 2020-04-06: 25 mg via ORAL
  Filled 2020-04-06: qty 1

## 2020-04-06 NOTE — Progress Notes (Signed)
*  PRELIMINARY RESULTS* Echocardiogram 2D Echocardiogram has been performed.  Kimberly Mcdonald 04/06/2020, 12:48 PM

## 2020-04-06 NOTE — Discharge Summary (Signed)
[facility] [place] [facility]   PATIENT NAME: Kimberly Mcdonald    MR#:  [id number]  DATE OF BIRTH:  [birth year]  DATE OF ADMISSION:  04/04/2020 ADMITTING PHYSICIAN: Enedina Finner, MD  DATE OF DISCHARGE: 04/06/2020  PRIMARY CARE PHYSICIAN: Rolm Gala, NP    ADMISSION DIAGNOSIS:  Acute upper GI bleeding [K92.2] Symptomatic anemia [D64.9] Gastrointestinal hemorrhage, unspecified gastrointestinal hemorrhage type [K92.2] Acute upper GI bleed [K92.2]  DISCHARGE DIAGNOSIS:  Melena/GL bleed due to Gastric erosions/Gastritis Sepsis due to Staph Lugdunensis--unlcear etio  SECONDARY DIAGNOSIS:   Past Medical History:  Diagnosis Date  . Allergy    seasonal  . Anemia due to blood loss 05/08/2019  . Arthritis    knees and back  . Cerebrovascular accident (CVA) (HCC)   . HTN (hypertension)   . Hyperlipidemia   . Hypertension   . 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 She also endorses acid reflux symptoms that has been worsening for the last month requiring her to take Tums nearly daily. She also endorses that she takes iron tablets.  #Severe symptomatic normocytic anemia-suspect multifactorial including secondary to iron deficiency anemia and upper GI bleed Large Hiatal hernia -Hemoglobin on presentation is 8.3--1 unit BT--10.0--8.6 --baseline hemoglobin is 12.8-12.9 -GI, Dr. Horace Porteous input appreciated. Pt to get EGD and colonoscopy today - onProtonix GTT-- change to oral Protonix -Holding aspirin 81 mg for a week to 10 days  # Sepsis per  criteria--POA-- with elevated lactic acid, sinus tachycardia, and elevated wbc  -Lacitc 5.5--5.4--2.2 -- No identified source of infection --recieved IVF -- patient was found to have 1/4 BC positve for Staph lugdunensis. She will switch to IV Ancef. ID consultation with Dr. Joylene Draft appreciated. No clear source identified. Patient received one dose of Oritavancin prior to discharge. 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 No emergent evaluation needed. Continue aspirin status and follow-up outpatient.  #Hypertension now normotensive without taking her blood pressure medicine for 2 days which may be to due to GI bleed -Amlodipine 10 mg daily   #History of left MCA-presumed secondary to left ICA stenosis;resumed atorvastatin 40 mg daily, holding aspirin  #CAD- holding home aspirin for now. Defer to primary care to resume in next few weeks  #Hyperlipidemia-atorvastatin   # tobacco abuse - patient states she will quit  nicotine patch prn ordered  # Morbid obesity - outpatient weight loss  overall hemodynamically stable. Patient ambulated with mobility therapist very well. Cheryl discharged to home today.   DVT prophylaxis:TED hose, holding pharmacologic DVT prophylaxis due to severe acute anemia secondary to upper GI bleed Code Status:Full code Diet:heart healthy Family Communication: daughter Cecelia Disposition Plan:home today Consults called:Gastroenterology,  vascular Level of care: Progressive Cardiac Status is: Inpatient    Dispo: The patient is from: Home  Anticipated d/c is to: Home  Patient currently is medically stable to d/c.              Difficult to place patient No     CONSULTS OBTAINED:  Treatment Team:  Stanton Kidney, MD Lynn Ito, MD  DRUG ALLERGIES:  No Known Allergies  DISCHARGE MEDICATIONS:   Allergies as of 04/06/2020   No Known Allergies     Medication List    STOP taking these medications   aspirin 81 MG EC tablet     TAKE these medications   amLODipine 10 MG tablet Commonly known as: NORVASC Take 1 tablet (10 mg total) by mouth daily.   atorvastatin 40 MG tablet Commonly known as: LIPITOR Take 1 tablet (40 mg total) by mouth daily.   cholecalciferol 25 MCG (1000 UNIT) tablet Commonly known as: VITAMIN D3 Take 1,000 Units by mouth daily.   ferrous sulfate 325 (65 FE) MG tablet Take 325 mg by mouth daily.   fluticasone 50 MCG/ACT nasal spray Commonly known as: Flonase Place 2 sprays into both nostrils daily.   pantoprazole 40 MG tablet Commonly known as: Protonix Take 1 tablet (40 mg total) by mouth daily.       If you experience worsening of your admission symptoms, develop shortness of breath, life threatening emergency, suicidal or homicidal thoughts you must seek medical attention immediately by calling 911 or calling your MD immediately  if symptoms less severe.  You Must read complete instructions/literature along with all the possible adverse reactions/side effects for all the Medicines you take and that have been prescribed to you. Take any new Medicines after you have completely understood and accept all the possible adverse reactions/side effects.   Please note  You were cared for by a hospitalist during your hospital stay. If you have any questions about your discharge medications or the care you received while you were in the  hospital after you are discharged, you can call the unit and asked to speak with the hospitalist on call if the hospitalist that took care of you is not available. Once you are discharged, your primary care physician will handle any further medical issues. Please note that NO REFILLS for any discharge medications will be authorized once you are discharged, as it is imperative that you return to your primary care physician (or establish a relationship with a primary care physician if you do not have one) for your aftercare needs so that they can reassess your need for medications and monitor your lab values. Today   SUBJECTIVE  no new complaints. No fever   VITAL SIGNS:  Blood pressure 116/74, pulse 92, temperature 98.1 F (36.7 C), resp. rate 17, height  (1.6 m), weight 108.2 kg, last menstrual period 10/01/2016, SpO2 96 %.  I/O:    Intake/Output Summary (Last 24 hours) at 04/06/2020 1150 Last data filed at 04/06/2020 0955 Gross per 24 hour  Intake 2087.37 ml  Output --  Net 2087.37 ml    PHYSICAL EXAMINATION:  GENERAL:  56 y.o.-year-old patient lying in the bed with no acute distress.  obese LUNGS: Normal breath sounds bilaterally, no wheezing, rales,rhonchi or crepitation. No use of accessory muscles of respiration.  CARDIOVASCULAR: S1, S2 normal. No murmurs, rubs, or gallops.  ABDOMEN: Soft, non-tender, non-distended. Bowel sounds present. No organomegaly or mass.  EXTREMITIES: No pedal edema, cyanosis, or clubbing.  NEUROLOGIC:  Cranial nerves II through XII are intact. Muscle strength 5/5 in all extremities. Sensation intact. Gait not checked.  PSYCHIATRIC:  patient is alert and oriented x 3.  SKIN: No obvious rash, lesion, or ulcer.   DATA REVIEW:   CBC  Recent Labs  Lab 04/06/20 0014 04/06/20 0654  WBC 13.8*  --   HGB 7.0* 8.2*  HCT 21.7* 24.9*  PLT 214  --     Chemistries  Recent Labs  Lab 04/04/20 1056 04/05/20 0501 04/06/20 0014  NA 143 144  --   K 3.8  3.3* 3.4*  CL 116* 117*  --   CO2 16* 18*  --   GLUCOSE 159* 142*  --   BUN 66* 26*  --   CREATININE 0.87 0.64  --   CALCIUM 8.7* 8.4*  --   AST 16  --   --   ALT 8  --   --   ALKPHOS 74  --   --   BILITOT 0.7  --   --     Microbiology Results   Recent Results (from the past 240 hour(s))  Culture, blood (routine x 2)     Status: None (Preliminary result)   Collection Time: 04/04/20  3:12 PM   Specimen: BLOOD  Result Value Ref Range Status   Specimen Description   Final    BLOOD BLOOD RIGHT ARM Performed at [facility], [address], [place], [place] [address]    Special Requests   Final    BOTTLES DRAWN AEROBIC AND ANAEROBIC Blood Culture adequate volume Performed at [facility], [address] [place], [place] [address]    Culture  Setup Time   Final    ANAEROBIC BOTTLE ONLY GRAM POSITIVE COCCI CRITICAL RESULT CALLED TO, READ BACK BY AND VERIFIED WITH: SUSAN WATSON 04/05/20 1457 KLW    Culture   Final    GRAM POSITIVE COCCI TOO YOUNG TO READ Performed at [facility], [address], [place], [place] [address]    Report Status PENDING  Incomplete  Blood Culture ID Panel (Reflexed)     Status: Abnormal   Collection Time: 04/04/20  3:12 PM  Result Value Ref Range Status   Enterococcus faecalis NOT DETECTED NOT DETECTED Final   Enterococcus Faecium NOT DETECTED NOT DETECTED Final   Listeria monocytogenes NOT DETECTED NOT DETECTED Final   Staphylococcus species DETECTED (A) NOT DETECTED Final    Comment: CRITICAL RESULT CALLED TO, READ BACK BY AND VERIFIED WITH: SUSAN WATSON 04/05/20 1457 KLW    Staphylococcus aureus (BCID) NOT DETECTED NOT DETECTED Final   Staphylococcus epidermidis NOT DETECTED NOT DETECTED Final   Staphylococcus lugdunensis DETECTED (A) NOT DETECTED Final    Comment: CRITICAL RESULT CALLED TO, READ BACK BY AND VERIFIED WITH:  SUSAN WATSON AT 1457 04/05/20 SDR    Streptococcus species NOT DETECTED NOT DETECTED Final    Streptococcus agalactiae NOT DETECTED NOT DETECTED Final   Streptococcus pneumoniae NOT DETECTED NOT DETECTED Final   Streptococcus pyogenes NOT DETECTED NOT DETECTED Final   A.calcoaceticus-baumannii NOT DETECTED NOT DETECTED Final   Bacteroides fragilis NOT DETECTED NOT DETECTED Final   Enterobacterales NOT DETECTED NOT DETECTED Final   Enterobacter cloacae complex NOT DETECTED NOT DETECTED Final   Escherichia coli NOT DETECTED NOT DETECTED Final   Klebsiella aerogenes NOT DETECTED NOT DETECTED Final   Klebsiella oxytoca NOT DETECTED NOT DETECTED Final   Klebsiella pneumoniae NOT DETECTED NOT DETECTED Final   Proteus species NOT DETECTED  NOT DETECTED Final   Salmonella species NOT DETECTED NOT DETECTED Final   Serratia marcescens NOT DETECTED NOT DETECTED Final   Haemophilus influenzae NOT DETECTED NOT DETECTED Final   Neisseria meningitidis NOT DETECTED NOT DETECTED Final   Pseudomonas aeruginosa NOT DETECTED NOT DETECTED Final   Stenotrophomonas maltophilia NOT DETECTED NOT DETECTED Final   Candida albicans NOT DETECTED NOT DETECTED Final   Candida auris NOT DETECTED NOT DETECTED Final   Candida glabrata NOT DETECTED NOT DETECTED Final   Candida krusei NOT DETECTED NOT DETECTED Final   Candida parapsilosis NOT DETECTED NOT DETECTED Final   Candida tropicalis NOT DETECTED NOT DETECTED Final   Cryptococcus neoformans/gattii NOT DETECTED NOT DETECTED Final   Methicillin resistance mecA/C NOT DETECTED NOT DETECTED Final    Comment: Performed at [facility], [address] [place], [place] [address]  Culture, blood (routine x 2)     Status: None (Preliminary result)   Collection Time: 04/04/20  3:32 PM   Specimen: BLOOD  Result Value Ref Range Status   Specimen Description   Final    BLOOD BLOOD RIGHT HAND Performed at [facility], [address] [place], [place] [address]    Special Requests   Final    BOTTLES DRAWN AEROBIC AND ANAEROBIC Blood Culture  results may not be optimal due to an inadequate volume of blood received in culture bottles Performed at [facility], [address] [place], [place] [address]    Culture  Setup Time   Final    AEROBIC BOTTLE ONLY GRAM POSITIVE COCCI CRITICAL VALUE NOTED.  VALUE IS CONSISTENT WITH PREVIOUSLY REPORTED AND CALLED VALUE. Performed at [facility], [address], [place], [place] [address]    Culture   Final    Romie Minus POSITIVE COCCI TOO YOUNG TO READ Performed at [facility], [address] [place], [place] [address]    Report Status PENDING  Incomplete  SARS CORONAVIRUS 2 (TAT 6-24 HRS) Nasopharyngeal Nasopharyngeal Swab     Status: None   Collection Time: 04/04/20  4:03 PM   Specimen: Nasopharyngeal Swab  Result Value Ref Range Status   SARS Coronavirus 2 NEGATIVE NEGATIVE Final    Comment: (NOTE) SARS-CoV-2 target nucleic acids are NOT DETECTED.  The SARS-CoV-2 RNA is generally detectable in upper and lower respiratory specimens during the acute phase of infection. Negative results do not preclude SARS-CoV-2 infection, do not rule out co-infections with other pathogens, and should not be used as the sole basis for treatment or other patient management decisions. Negative results must be combined with clinical observations, patient history, and epidemiological information. The expected result is Negative.  Fact Sheet for Patients: HairSlick.no  Fact Sheet for Healthcare Providers: quierodirigir.com  This test is not yet approved or cleared by the [place] FDA and  has been authorized for detection and/or diagnosis of SARS-CoV-2 by FDA under an Emergency Use Authorization (EUA). This EUA will remain  in effect (meaning this test can be used) for the duration of the COVID-19 declaration under Se ction 564(b)(1) of the Act, 21 U.S.C. section 360bbb-3(b)(1), unless the authorization is  terminated or revoked sooner.  Performed at [facility], [address] [place], [place] [address]   CULTURE, BLOOD (ROUTINE X 2) w Reflex to ID Panel     Status: None (Preliminary result)   Collection Time: 04/06/20 12:14 AM   Specimen: BLOOD  Result Value Ref Range Status   Specimen Description BLOOD BLOOD RIGHT  HAND  Final   Special Requests   Final    BOTTLES DRAWN AEROBIC AND ANAEROBIC Blood Culture results may not be optimal due to an excessive volume of blood received in culture bottles   Culture   Final    NO GROWTH < 12 HOURS Performed at [facility], [address] [place], [place] [address]    Report Status PENDING  Incomplete  CULTURE, BLOOD (ROUTINE X 2) w Reflex to ID Panel     Status: None (Preliminary result)   Collection Time: 04/06/20 12:22 AM   Specimen: BLOOD  Result Value Ref Range Status   Specimen Description BLOOD BLOOD LEFT HAND  Final   Special Requests   Final    BOTTLES DRAWN AEROBIC AND ANAEROBIC Blood Culture results may not be optimal due to an inadequate volume of blood received in culture bottles   Culture   Final    NO GROWTH < 12 HOURS Performed at [facility], [address] [place], [place] [address]    Report Status PENDING  Incomplete    RADIOLOGY:  CT Abdomen Pelvis W Contrast  Result Date: 04/04/2020 CLINICAL DATA:  Melena, generalized weakness, acute nonlocalized abdominal pain EXAM: CT ABDOMEN AND PELVIS WITH CONTRAST TECHNIQUE: Multidetector CT imaging of the abdomen and pelvis was performed using the standard protocol following bolus administration of intravenous contrast. CONTRAST:  100mL OMNIPAQUE IOHEXOL 300 MG/ML  SOLN COMPARISON:  None. FINDINGS: Lower chest: The visualized lung bases are clear bilaterally. The visualized heart and pericardium are unremarkable. Large hiatal hernia. Hepatobiliary: No focal liver abnormality is seen. No gallstones, gallbladder wall thickening, or biliary dilatation.  Pancreas: Unremarkable Spleen: Unremarkable Adrenals/Urinary Tract: Adrenal glands are unremarkable. Kidneys are normal, without renal calculi, focal lesion, or hydronephrosis. Bladder is unremarkable. Stomach/Bowel: Mild sigmoid diverticulosis. The large and small bowel are otherwise unremarkable. Appendix normal. No free intraperitoneal gas or fluid. 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 Distally, the vessel appears patent. The abdominal vasculature is otherwise unremarkable. No aortic aneurysm. No pathologic adenopathy within the abdomen and pelvis. Reproductive: The endometrium appears thickened measuring up to 14 mm in thickness. Lobulated appearance of the uterine fundus likely relates to underlying uterine fibroids. The pelvic organs are otherwise unremarkable. Other: Tiny fat containing umbilical and small fat containing bilateral inguinal hernias are present. The rectum is unremarkable. Musculoskeletal: Degenerative changes are seen within the lumbar spine. No acute bone abnormality. No lytic or blastic bone lesions. IMPRESSION: No acute intra-abdominal pathology identified. No definite radiographic explanation for the patient's reported symptoms. Large hiatal hernia. 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 Inferior mesenteric artery and celiac axis appear widely patent, however. Thickening of the endometrium, abnormal in a postmenopausal patient. This should be confirmed with with dedicated sonography. In Electronically Signed   By: Helyn NumbersAshesh  Parikh MD   On: 04/04/2020 15:27     CODE STATUS:     Code Status Orders  (From admission, onward)         Start     Ordered   04/04/20 1631  Full code  Continuous        04/04/20 1632        Code Status History    Date Active Date  Inactive Code Status Order ID Comments User Context   05/08/2019 1847 05/15/2019 1954 Full Code  [id number]  Lorretta Harp, MD Inpatient   Advance Care Planning Activity       TOTAL TIME TAKING CARE OF THIS PATIENT: 35 minutes.    Enedina Finner M.D  [facility]    CC: Primary care physician; Rolm Gala, NP

## 2020-04-06 NOTE — Discharge Planning (Signed)
Patient IV  And tele removed.  Discharge papers given, explained and educated.  Informed of suggested FU appts and appts made.  Told of scripts sent to pharmacy.  IV Anbx were are to be completed prior to DC. Once ready, will be wheeled to front and family transporting home via car.

## 2020-04-06 NOTE — Progress Notes (Signed)
Mobility Specialist - Progress Note   04/06/20 1100  Mobility  Activity Ambulated in hall  Level of Assistance Modified independent, requires aide device or extra time  Assistive Device None  Distance Ambulated (ft) 180 ft  Mobility Response Tolerated well  Mobility performed by Mobility specialist  $Mobility charge 1 Mobility    Pt ambulated in hallway without AD. No LOB noted. Pt denied SOB. HR WNL. Reports only mild fatigue from activity. ModI.    Filiberto Pinks Mobility Specialist 04/06/20, 11:12 AM

## 2020-04-06 NOTE — Plan of Care (Addendum)
Patient called me into room, complaining of itching ALL OVER. I noticed her scratching/fidgeting with arms, scalp and even legs.  IV anbx about 1/2 way infused (but since new medication) was stopped and notified Dr.  Stann Mainland give benedryl and re-assess itching - then re-start remainder of anbx if Dr. feels appropriate.  25mg  PO benedryl given and will restart infusion to hopefully complete (per verbal orders).  Patient informed and agrees.

## 2020-04-07 LAB — TYPE AND SCREEN
ABO/RH(D): O POS
Antibody Screen: NEGATIVE
Unit division: 0
Unit division: 0

## 2020-04-07 LAB — BPAM RBC
Blood Product Expiration Date: 202204262359
Blood Product Expiration Date: 202204262359
ISSUE DATE / TIME: 202203231555
ISSUE DATE / TIME: 202203250242
Unit Type and Rh: 5100
Unit Type and Rh: 5100

## 2020-04-08 ENCOUNTER — Emergency Department
Admission: EM | Admit: 2020-04-08 | Discharge: 2020-04-08 | Disposition: A | Discharge status: Home / Self Care | Payer: Self-pay | Attending: Emergency Medicine | Admitting: Emergency Medicine

## 2020-04-08 ENCOUNTER — Emergency Department: Payer: Self-pay

## 2020-04-08 ENCOUNTER — Other Ambulatory Visit: Payer: Self-pay

## 2020-04-08 DIAGNOSIS — R519 Headache, unspecified: Secondary | ICD-10-CM

## 2020-04-08 DIAGNOSIS — Z79899 Other long term (current) drug therapy: Secondary | ICD-10-CM | POA: Insufficient documentation

## 2020-04-08 DIAGNOSIS — Z87891 Personal history of nicotine dependence: Secondary | ICD-10-CM | POA: Insufficient documentation

## 2020-04-08 DIAGNOSIS — I10 Essential (primary) hypertension: Secondary | ICD-10-CM | POA: Insufficient documentation

## 2020-04-08 HISTORY — DX: Cerebral infarction, unspecified: I63.9

## 2020-04-08 HISTORY — DX: Essential (primary) hypertension: I10

## 2020-04-08 LAB — CULTURE, BLOOD (ROUTINE X 2): Special Requests: ADEQUATE

## 2020-04-08 LAB — CBC WITH DIFFERENTIAL/PLATELET
Abs Immature Granulocytes: 0.16 10*3/uL — ABNORMAL HIGH (ref 0.00–0.07)
Basophils Absolute: 0 10*3/uL (ref 0.0–0.1)
Basophils Relative: 0 %
Eosinophils Absolute: 0.1 10*3/uL (ref 0.0–0.5)
Eosinophils Relative: 1 %
HCT: 27.2 % — ABNORMAL LOW (ref 36.0–46.0)
Hemoglobin: 8.8 g/dL — ABNORMAL LOW (ref 12.0–15.0)
Immature Granulocytes: 1 %
Lymphocytes Relative: 21 %
Lymphs Abs: 3.1 10*3/uL (ref 0.7–4.0)
MCH: 30.1 pg (ref 26.0–34.0)
MCHC: 32.4 g/dL (ref 30.0–36.0)
MCV: 93.2 fL (ref 80.0–100.0)
Monocytes Absolute: 1.3 10*3/uL — ABNORMAL HIGH (ref 0.1–1.0)
Monocytes Relative: 9 %
Neutro Abs: 9.9 10*3/uL — ABNORMAL HIGH (ref 1.7–7.7)
Neutrophils Relative %: 68 %
Platelets: 315 10*3/uL (ref 150–400)
RBC: 2.92 MIL/uL — ABNORMAL LOW (ref 3.87–5.11)
RDW: 14.8 % (ref 11.5–15.5)
WBC: 14.6 10*3/uL — ABNORMAL HIGH (ref 4.0–10.5)
nRBC: 1.2 % — ABNORMAL HIGH (ref 0.0–0.2)

## 2020-04-08 LAB — COMPREHENSIVE METABOLIC PANEL
ALT: 7 U/L (ref 0–44)
AST: 14 U/L — ABNORMAL LOW (ref 15–41)
Albumin: 3.4 g/dL — ABNORMAL LOW (ref 3.5–5.0)
Alkaline Phosphatase: 83 U/L (ref 38–126)
Anion gap: 9 (ref 5–15)
BUN: 9 mg/dL (ref 6–20)
CO2: 24 mmol/L (ref 22–32)
Calcium: 8.5 mg/dL — ABNORMAL LOW (ref 8.9–10.3)
Chloride: 106 mmol/L (ref 98–111)
Creatinine, Ser: 0.67 mg/dL (ref 0.44–1.00)
GFR, Estimated: >60 mL/min (ref >60)
Glucose, Bld: 102 mg/dL — ABNORMAL HIGH (ref 70–99)
Potassium: 3.4 mmol/L — ABNORMAL LOW (ref 3.5–5.1)
Sodium: 139 mmol/L (ref 135–145)
Total Bilirubin: 0.8 mg/dL (ref 0.3–1.2)
Total Protein: 6.8 g/dL (ref 6.5–8.1)

## 2020-04-08 MED ORDER — METOCLOPRAMIDE HCL 5 MG/ML IJ SOLN
10.0000 mg | Freq: Once | INTRAMUSCULAR | Status: AC
  Administered 2020-04-08: 10 mg via INTRAVENOUS
  Filled 2020-04-08: qty 2

## 2020-04-08 MED ORDER — BUTALBITAL-APAP-CAFFEINE 50-325-40 MG PO TABS: 1.0000 | ORAL_TABLET | Freq: Four times a day (QID) | ORAL | 0 refills | Status: AC | PRN

## 2020-04-08 MED ORDER — DIPHENHYDRAMINE HCL 50 MG/ML IJ SOLN
12.5000 mg | Freq: Once | INTRAMUSCULAR | Status: AC
  Administered 2020-04-08: 12.5 mg via INTRAVENOUS
  Filled 2020-04-08: qty 1

## 2020-04-08 NOTE — ED Notes (Signed)
Taken to CT.

## 2020-04-08 NOTE — Discharge Instructions (Signed)
You have been prescribed fioricet for headache. Please do not drive or operate heavy machinery on this medication. If your symptoms worsen, please return to PCP or ER for repeat evaluation.

## 2020-04-08 NOTE — ED Notes (Signed)
Pt with GCS 15 at this time-- reports frontal region that feels like migraine HA; states h/o same - unable to recall meds used in the past to manage migraines.  Denies nausea/dizziness/photophobia.  Rates pain at 10/10 - states movements and coughing make HA worse; confirms has taken nothing for HA relief prior to arrival.

## 2020-04-08 NOTE — ED Triage Notes (Signed)
Pt comes pov with headache. Pt was released from hospital yesterday from abd sx (hernia) and states that pt has had h/a since getting home from hospital. Has not taken any meds. Denies thinners.

## 2020-04-08 NOTE — ED Notes (Signed)
Xray now at bedside for [facility]

## 2020-04-08 NOTE — ED Provider Notes (Signed)
[facility] Emergency Department Provider Note  ____________________________________________   Event Date/Time   First MD Initiated Contact with Patient 04/08/20 2104     (approximate)  I have reviewed the triage vital signs and the nursing notes.   HISTORY  Chief Complaint Headache  HPI Kimberly Mcdonald is a 55 y.o. female who presents to the emergency department for evaluation of headache.  Patient was discharged from our facility yesterday following GI bleed.  During her hospitalization, she was taken off of her daily aspirin that she was previously on for "mini strokes and heart problems".  She states that her headache began at the time that she got home from her discharge.  She reports the pain is frontal and left-sided.  She denies any blurred vision, dizziness, weakness, abdominal pain.  She does not have any history of significant stroke, but has been told that there was CT evidence of "mini strokes" but that she does not have any residual deficits.  She denies attempting any medication for her headache at home.  She does not have any new falls or trauma.  She denies history of migraines, states that this is "unlike any headache of ever had before".  She does not describe it as a thunderclap/quick onset headache, but was rather gradual.  She also states that she has had intermittent mild improvement in symptoms, although it has never completely gone away in the last 24 hours.        Past Medical History:  Diagnosis Date  . Allergy    seasonal  . Anemia due to blood loss 05/08/2019  . Arthritis    knees and back  . Cerebrovascular accident (CVA) (HCC)   . HTN (hypertension)   . Hyperlipidemia   . Hypertension   . Stroke (HCC)   . Substance abuse (HCC)    Marijuana smoker x35 yrs    Patient Active Problem List   Diagnosis Date Noted  . Acute upper GI bleed 04/05/2020  . Acute upper GI bleeding 04/04/2020  . Obesity, Class III, BMI 40-49.9  (morbid obesity) (HCC) 04/04/2020  . Bilateral knee pain 03/28/2020  . Sinusitis 02/29/2020  . Carotid stenosis 06/07/2019  . Cerebrovascular accident (CVA) (HCC)   . Right arm numbness 05/08/2019  . GIB (gastrointestinal bleeding) 05/08/2019  . Symptomatic anemia 05/08/2019  . Tobacco abuse 05/08/2019  . Hypokalemia 05/08/2019  . Hypertension 09/03/2017  . Arthritis 09/03/2017  . Healthcare maintenance 09/03/2017  . Low back pain 09/03/2017    Past Surgical History:  Procedure Laterality Date  . ABDOMINAL SURGERY    . COLONOSCOPY WITH PROPOFOL N/A 04/05/2020   Procedure: COLONOSCOPY WITH PROPOFOL;  Surgeon: Toledo, Boykin Nearing, MD;  Location: [facility] ENDOSCOPY;  Service: Gastroenterology;  Laterality: N/A;  . ENDARTERECTOMY Left 05/13/2019   Procedure: ENDARTERECTOMY CAROTID;  Surgeon: Renford Dills, MD;  Location: [facility] ORS;  Service: Vascular;  Laterality: Left;  . ESOPHAGOGASTRODUODENOSCOPY (EGD) WITH PROPOFOL N/A 04/05/2020   Procedure: ESOPHAGOGASTRODUODENOSCOPY (EGD) WITH PROPOFOL;  Surgeon: Toledo, Boykin Nearing, MD;  Location: [facility] ENDOSCOPY;  Service: Gastroenterology;  Laterality: N/A;  . TUBAL LIGATION      Prior to Admission medications   Medication Sig Start Date End Date Taking? Authorizing Provider  butalbital-acetaminophen-caffeine (FIORICET) 50-325-40 MG tablet Take 1 tablet by mouth every 6 (six) hours as needed for headache. 04/08/20 04/08/21 Yes Rodgers, Ruben Gottron, PA  amLODipine (NORVASC) 10 MG tablet Take 1 tablet (10 mg total) by mouth daily. 02/29/20   Iloabachie, Chioma E,  NP  atorvastatin (LIPITOR) 40 MG tablet Take 1 tablet (40 mg total) by mouth daily. 03/28/20   Iloabachie, Chioma E, NP  cholecalciferol (VITAMIN D3) 25 MCG (1000 UNIT) tablet Take 1,000 Units by mouth daily.    [provider]  ferrous sulfate 325 (65 FE) MG tablet Take 325 mg by mouth daily.    [provider]  fluticasone (FLONASE) 50 MCG/ACT nasal spray Place 2 sprays into both  nostrils daily. 02/29/20   Iloabachie, Chioma E, NP  pantoprazole (PROTONIX) 40 MG tablet Take 1 tablet (40 mg total) by mouth daily. 03/28/20 06/26/20  Iloabachie, Chioma E, NP    Allergies Patient has no known allergies.  Family History  Problem Relation Age of Onset  . Diabetes Mother   . Hypertension Mother   . Cancer Father   . Alcohol abuse Sister     Social History Social History   Tobacco Use  . Smoking status: Former Games developer  . Smokeless tobacco: Never Used  . Tobacco comment: only 2 cigarettes since surgery a month ago  Vaping Use  . Vaping Use: Never used  Substance Use Topics  . Alcohol use: Not Currently    Comment: have not been drinking since surgery  . Drug use: Not Currently    Frequency: 3.0 times per week    Types: Marijuana    Comment: unsure exactly of use    Review of Systems Constitutional: No fever/chills Eyes: No visual changes. ENT: No sore throat. Cardiovascular: Denies chest pain. Respiratory: Denies shortness of breath. Gastrointestinal: No abdominal pain.  No nausea, no vomiting.  No diarrhea.  No constipation. Genitourinary: Negative for dysuria. Musculoskeletal: Negative for back pain. Skin: Negative for rash. Neurological: + headaches, negative for focal weakness or numbness.  ____________________________________________   PHYSICAL EXAM:  VITAL SIGNS: ED Triage Vitals  Enc Vitals Group     BP 04/08/20 1959 133/87     Pulse Rate 04/08/20 1959 98     Resp 04/08/20 1959 18     Temp 04/08/20 1959 99 F (37.2 C)     Temp Source 04/08/20 1959 Oral     SpO2 04/08/20 1959 93 %     Weight 04/08/20 2000 240 lb (108.9 kg)     Height 04/08/20 2000 5\' 3"  (1.6 m)     Head Circumference --      Peak Flow --      Pain Score 04/08/20 2000 10     Pain Loc --      Pain Edu? --      Excl. in GC? --    Constitutional: Alert and oriented. Well appearing and in no acute distress. Eyes: Conjunctivae are normal. PERRL. EOMI. Head:  Atraumatic. Nose: No congestion/rhinnorhea. Mouth/Throat: Mucous membranes are moist.  Neck: No stridor.   Cardiovascular: Normal rate, regular rhythm. Grossly normal heart sounds.  Good peripheral circulation. Respiratory: Normal respiratory effort.  No retractions. Lungs CTAB. Gastrointestinal: Soft and nontender. No distention. No abdominal bruits. No CVA tenderness. Musculoskeletal: No lower extremity tenderness nor edema.  No joint effusions. Neurologic:  Normal speech and language.  Cranial nerves II through XII grossly intact.  No gross focal neurologic deficits are appreciated. No gait instability. Skin:  Skin is warm, dry and intact. No rash noted. Psychiatric: Mood and affect are normal. Speech and behavior are normal.  ____________________________________________   LABS (all labs ordered are listed, but only abnormal results are displayed)  Labs Reviewed  COMPREHENSIVE METABOLIC PANEL - Abnormal; Notable for the following  components:      Result Value   Potassium 3.4 (*)    Glucose, Bld 102 (*)    Calcium 8.5 (*)    Albumin 3.4 (*)    AST 14 (*)    All other components within normal limits  CBC WITH DIFFERENTIAL/PLATELET - Abnormal; Notable for the following components:   WBC 14.6 (*)    RBC 2.92 (*)    Hemoglobin 8.8 (*)    HCT 27.2 (*)    nRBC 1.2 (*)    Neutro Abs 9.9 (*)    Monocytes Absolute 1.3 (*)    Abs Immature Granulocytes 0.16 (*)    All other components within normal limits    ____________________________________________  RADIOLOGY See radiology report for CT findings  Official radiology report(s): CT Head Wo Contrast  Result Date: 04/08/2020 CLINICAL DATA:  Headache, intracranial hemorrhage suspected. EXAM: CT HEAD WITHOUT CONTRAST TECHNIQUE: Contiguous axial images were obtained from the base of the skull through the vertex without intravenous contrast. COMPARISON:  Brain MRI May 09, 2019 and head CT May 09, 2019. FINDINGS: Brain: No evidence  of acute large vascular territory infarction, hemorrhage, hydrocephalus, extra-axial collection or mass lesion/mass effect. Chronic lacunar type infarcts of the left caudate head, anterior limb of the right internal capsule/adjacent corona radiata and left corona radiata. Normalization in the bilateral basal ganglia. Vascular: No hyperdense vessel. Atherosclerotic calcifications of the intracranial portions of the internal carotid arteries. Skull: Normal. Negative for fracture or focal lesion. Sinuses/Orbits: Paranasal sinuses and mastoid air cells are predominantly clear. Orbits are grossly unremarkable. Other: None IMPRESSION: 1. No evidence of acute intracranial abnormality. 2. Chronic lacunar type infarcts of the left caudate head, anterior limb of the right internal capsule/adjacent corona radiata and left corona radiata. Electronically Signed   By: Maudry MayhewJeffrey  Waltz MD   On: 04/08/2020 20:42   DG Chest Portable 1 View  Result Date: 04/08/2020 CLINICAL DATA:  Shortness of breath. Headache. Hernia surgery recently, released from hospital yesterday. EXAM: PORTABLE CHEST 1 VIEW COMPARISON:  CT 04/04/2020 FINDINGS: Hiatal hernia. Stable mediastinal contours and heart size. No acute airspace disease, pulmonary edema, pneumothorax or significant pleural effusion. No acute osseous abnormalities are seen. IMPRESSION: 1. No acute chest finding. 2. Hiatal hernia. Electronically Signed   By: Narda RutherfordMelanie  Sanford M.D.   On: 04/08/2020 22:14    ____________________________________________   INITIAL IMPRESSION / ASSESSMENT AND PLAN / ED COURSE  As part of my medical decision making, I reviewed the following data within the electronic MEDICAL RECORD NUMBER Nursing notes reviewed and incorporated, Labs reviewed, Evaluated by EM attending Dr. Fuller PlanFunke and Notes from prior ED visits        Patient is a 55 year old female with past medical history concerning for possible stroke who presents to the emergency department for  evaluation of headache for the last 24 hours after discharge from this facility for acute GI bleed.  Patient denies any fevers, chills, neck pain, dizziness, blurred vision or other acute symptoms or deficits.  In triage, the patient has grossly normal vital signs.  On physical exam, she does not have any significant cranial nerve deficit or other neurologic deficit.  CT of the head was obtained from triage standing order and is negative for acute bleed.  Laboratory evaluation was obtained with CBC and CMP.  The patient has a leukocytosis of 14.6, though compared to recent hospitalization this appears comparable to her recent.  She also has a hemoglobin of 8.8, though this is increased from her  prior hemoglobin which required transfusion during her hospitalization.  Other labs do not show any significant dyscrasias.  Patient is reporting some improvement in her headache at this time, prior to medications being given.  His case was discussed with Dr. Fuller Plan, who also came and personally evaluated the patient.  Discussed the value of CT angio of her head/neck versus possible CT venogram.  The patient reports that since she is having improvement at this time, she would like to decline imaging.  She was given a dose of Reglan and Benadryl, and reports even more improvement following this medication.  Patient will be discharged with a short course of Fioricet per recommendation of Dr. Fuller Plan.  Strict return precautions were discussed with the patient, particularly regarding her risk factors.  Patient is amenable with this plan, she stable this time for outpatient follow-up.      ____________________________________________   FINAL CLINICAL IMPRESSION(S) / ED DIAGNOSES  Final diagnoses:  Acute nonintractable headache, unspecified headache type     ED Discharge Orders         Ordered    butalbital-acetaminophen-caffeine (FIORICET) 50-325-40 MG tablet  Every 6 hours PRN        04/08/20 2310           *Please note:  Madine Sarr was evaluated in Emergency Department on 04/09/2020 for the symptoms described in the history of present illness. She was evaluated in the context of the global COVID-19 pandemic, which necessitated consideration that the patient might be at risk for infection with the SARS-CoV-2 virus that causes COVID-19. Institutional protocols and algorithms that pertain to the evaluation of patients at risk for COVID-19 are in a state of rapid change based on information released by regulatory bodies including the CDC and federal and state organizations. These policies and algorithms were followed during the patient's care in the ED.  Some ED evaluations and interventions may be delayed as a result of limited staffing during and the pandemic.*   Note:  This document was prepared using Dragon voice recognition software and may include unintentional dictation errors.   Lucy Chris, PA 04/09/20 1639    Concha Se, MD 04/11/20 [phone]

## 2020-04-08 NOTE — ED Notes (Signed)
Pt agreeable with d/c instructions as discussed by provider C Rodgers- this nurse has verbally reinforced d/c instructions and provided a written copy - pt acknowledges verbal understanding and denies any additional questions, concerns, needs- -escorted to ED lobby to await ride who is enroute

## 2020-04-08 NOTE — ED Notes (Signed)
ED Provider at bedside. 

## 2020-04-08 NOTE — ED Notes (Signed)
Pt arrives from waiting room via w/c - transfers from w/c to stretcher in room with some moderate difficulty d/t pain.  GCS 15.  Denies light/noise sensitivity.

## 2020-04-09 ENCOUNTER — Encounter: Payer: Self-pay | Admitting: Internal Medicine

## 2020-04-09 ENCOUNTER — Telehealth: Payer: Self-pay | Admitting: *Deleted

## 2020-04-09 DIAGNOSIS — Z87891 Personal history of nicotine dependence: Secondary | ICD-10-CM

## 2020-04-09 DIAGNOSIS — Z122 Encounter for screening for malignant neoplasm of respiratory organs: Secondary | ICD-10-CM

## 2020-04-09 LAB — SURGICAL PATHOLOGY

## 2020-04-09 NOTE — Telephone Encounter (Signed)
Received referral for initial lung cancer screening scan. Contacted patient and obtained smoking history,(former, quit 04/03/20 38 pack year) as well as answering questions related to screening process. Patient denies signs of lung cancer such as weight loss or hemoptysis. Patient denies comorbidity that would prevent curative treatment if lung cancer were found. Patient is scheduled for shared decision making visit and CT scan on 04/17/20 at 130pm.

## 2020-04-11 LAB — CULTURE, BLOOD (ROUTINE X 2)
Culture: NO GROWTH
Culture: NO GROWTH

## 2020-04-12 NOTE — Anesthesia Postprocedure Evaluation (Signed)
Anesthesia Post Note  Patient: Kimberly Mcdonald  Procedure(s) Performed: ESOPHAGOGASTRODUODENOSCOPY (EGD) WITH PROPOFOL (N/A ) COLONOSCOPY WITH PROPOFOL (N/A )  Patient location during evaluation: Endoscopy Anesthesia Type: General Level of consciousness: awake and alert Pain management: pain level controlled Vital Signs Assessment: post-procedure vital signs reviewed and stable Respiratory status: spontaneous breathing, nonlabored ventilation and respiratory function stable Cardiovascular status: blood pressure returned to baseline and stable Postop Assessment: no apparent nausea or vomiting Anesthetic complications: no   No complications documented.   Last Vitals:  Vitals:   04/06/20 0724 04/06/20 1536  BP: 116/74 131/71  Pulse: 92 92  Resp: 17 17  Temp: 36.7 C 36.8 C  SpO2: 96% 97%    Last Pain:  Vitals:   04/06/20 0724  TempSrc:   PainSc: 0-No pain                 Christia Reading

## 2020-04-13 NOTE — Addendum Note (Signed)
Addendum  created 04/13/20 1111 by Karoline Caldwell, CRNA   Attestation recorded in Intraprocedure, Flowsheet accepted, Intraprocedure Attestations filed, Optician, dispensing edited

## 2020-04-17 ENCOUNTER — Ambulatory Visit
Admission: RE | Admit: 2020-04-17 | Discharge: 2020-04-17 | Disposition: A | Discharge status: Home / Self Care | Payer: Self-pay | Source: Ambulatory Visit | Attending: Nurse Practitioner | Admitting: Nurse Practitioner

## 2020-04-17 ENCOUNTER — Inpatient Hospital Stay: Payer: Medicaid Other | Attending: Nurse Practitioner | Admitting: Oncology

## 2020-04-17 ENCOUNTER — Other Ambulatory Visit: Payer: Self-pay

## 2020-04-17 DIAGNOSIS — Z122 Encounter for screening for malignant neoplasm of respiratory organs: Secondary | ICD-10-CM | POA: Insufficient documentation

## 2020-04-17 DIAGNOSIS — Z87891 Personal history of nicotine dependence: Secondary | ICD-10-CM | POA: Insufficient documentation

## 2020-04-17 NOTE — Progress Notes (Signed)
Virtual Visit via Video Note  I connected with Kimberly Mcdonald on 04/17/20 at  1:30 PM EDT by a video enabled telemedicine application and verified that I am speaking with the correct person using two identifiers.  Location: Patient: [facility] Provider: Clinic    I discussed the limitations of evaluation and management by telemedicine and the availability of in person appointments. The patient expressed understanding and agreed to proceed.  I discussed the assessment and treatment plan with the patient. The patient was provided an opportunity to ask questions and all were answered. The patient agreed with the plan and demonstrated an understanding of the instructions.   The patient was advised to call back or seek an in-person evaluation if the symptoms worsen or if the condition fails to improve as anticipated.   In accordance with CMS guidelines, patient has met eligibility criteria including age, absence of signs or symptoms of lung cancer.  Social History   Tobacco Use  . Smoking status: Former Smoker    Packs/day: 1.00    Years: 38.00    Pack years: 38.00    Types: Cigarettes    Quit date: 04/03/2020    Years since quitting: 0.0  . Smokeless tobacco: Never Used  Vaping Use  . Vaping Use: Never used  Substance Use Topics  . Alcohol use: Not Currently    Comment: have not been drinking since surgery  . Drug use: Not Currently    Frequency: 3.0 times per week    Types: Marijuana    Comment: unsure exactly of use      A shared decision-making session was conducted prior to the performance of CT scan. This includes one or more decision aids, includes benefits and harms of screening, follow-up diagnostic testing, over-diagnosis, false positive rate, and total radiation exposure.   Counseling on the importance of adherence to annual lung cancer LDCT screening, impact of co-morbidities, and ability or willingness to undergo diagnosis and treatment is imperative for compliance of the  program.   Counseling on the importance of continued smoking cessation for former smokers; the importance of smoking cessation for current smokers, and information about tobacco cessation interventions have been given to patient including [place] and [address] quit [place] programs.   Written order for lung cancer screening with LDCT has been given to the patient and any and all questions have been answered to the best of my abilities.    Yearly follow up will be coordinated by Burgess Estelle, Thoracic Navigator.  I provided 15 minutes of face-to-face video visit time during this encounter, and > 50% was spent counseling as documented under my assessment & plan.   Kimberly Hawking, NP

## 2020-04-18 ENCOUNTER — Encounter: Payer: Self-pay | Admitting: *Deleted

## 2020-04-18 ENCOUNTER — Ambulatory Visit: Payer: Medicaid Other | Attending: Oncology | Admitting: *Deleted

## 2020-04-18 ENCOUNTER — Telehealth: Payer: Self-pay | Admitting: *Deleted

## 2020-04-18 ENCOUNTER — Ambulatory Visit
Admission: RE | Admit: 2020-04-18 | Discharge: 2020-04-18 | Disposition: A | Discharge status: Home / Self Care | Payer: Medicaid Other | Source: Ambulatory Visit | Attending: Oncology | Admitting: Oncology

## 2020-04-18 VITALS — BP 133/78 | HR 96 | Temp 97.9°F | Ht 63.0 in | Wt 243.7 lb

## 2020-04-18 DIAGNOSIS — Z0000 Encounter for general adult medical examination without abnormal findings: Secondary | ICD-10-CM | POA: Insufficient documentation

## 2020-04-18 NOTE — Patient Instructions (Signed)
Gave patient hand-out, Women Staying Healthy, Active and Well from [facility], with education on breast health, pap smears, heart and colon health. 
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
## 2020-04-18 NOTE — Telephone Encounter (Signed)
Notified patient of LDCT lung cancer screening program results with recommendation for 12 month follow up imaging. Also notified of incidental findings noted below and is encouraged to discuss further with PCP who will receive a copy of this note and/or the CT report. Patient verbalizes understanding.   IMPRESSION: 1. Lung-RADS 2, benign appearance or behavior. Continue annual screening with low-dose chest CT without contrast in 12 months. 2. Large hiatal hernia. 3.  Emphysema (ICD10-J43.9) and Aortic Atherosclerosis (ICD10-170.0)

## 2020-04-20 ENCOUNTER — Other Ambulatory Visit: Payer: Self-pay

## 2020-04-20 DIAGNOSIS — R920 Mammographic microcalcification found on diagnostic imaging of breast: Secondary | ICD-10-CM

## 2020-04-20 DIAGNOSIS — N63 Unspecified lump in unspecified breast: Secondary | ICD-10-CM

## 2020-04-23 LAB — IGP, APTIMA HPV: HPV Aptima: NEGATIVE

## 2020-04-26 ENCOUNTER — Ambulatory Visit: Payer: Medicaid Other | Admitting: Gerontology

## 2020-04-26 ENCOUNTER — Other Ambulatory Visit: Payer: Self-pay

## 2020-04-26 ENCOUNTER — Encounter: Payer: Self-pay | Admitting: Gerontology

## 2020-04-26 VITALS — BP 129/87 | HR 86 | Temp 97.9°F | Resp 17 | Wt 239.3 lb

## 2020-04-26 DIAGNOSIS — K922 Gastrointestinal hemorrhage, unspecified: Secondary | ICD-10-CM

## 2020-04-26 DIAGNOSIS — J22 Unspecified acute lower respiratory infection: Secondary | ICD-10-CM

## 2020-04-26 DIAGNOSIS — K449 Diaphragmatic hernia without obstruction or gangrene: Secondary | ICD-10-CM

## 2020-04-26 DIAGNOSIS — I10 Essential (primary) hypertension: Secondary | ICD-10-CM

## 2020-04-26 DIAGNOSIS — N814 Uterovaginal prolapse, unspecified: Secondary | ICD-10-CM

## 2020-04-26 MED ORDER — BENZONATATE 100 MG PO CAPS
100.0000 mg | ORAL_CAPSULE | Freq: Three times a day (TID) | ORAL | 0 refills | Status: DC | PRN
  Filled 2020-04-26: qty 20, 7d supply, fill #0

## 2020-04-26 MED ORDER — ALBUTEROL SULFATE HFA 108 (90 BASE) MCG/ACT IN AERS
2.0000 | INHALATION_SPRAY | Freq: Four times a day (QID) | RESPIRATORY_TRACT | 0 refills | Status: AC | PRN
  Filled 2020-04-26: qty 6.7, 25d supply, fill #0

## 2020-04-26 MED ORDER — AMOXICILLIN-POT CLAVULANATE 875-125 MG PO TABS
875.0000 mg | ORAL_TABLET | Freq: Two times a day (BID) | ORAL | 0 refills | Status: AC
  Filled 2020-04-26: qty 14, 7d supply, fill #0

## 2020-04-26 MED ORDER — PREDNISONE 20 MG PO TABS
20.0000 mg | ORAL_TABLET | Freq: Every day | ORAL | 0 refills | Status: AC
  Filled 2020-04-26: qty 5, 5d supply, fill #0

## 2020-04-26 NOTE — Patient Instructions (Signed)
Cough, Adult A cough helps to clear your throat and lungs. A cough may be a sign of an illness or another medical condition. An acute cough may only last 2-3 weeks, while a chronic cough may last 8 or more weeks. Many things can cause a cough. They include:  Germs (viruses or bacteria) that attack the airway.  Breathing in things that bother (irritate) your lungs.  Allergies.  Asthma.  Mucus that runs down the back of your throat (postnasal drip).  Smoking.  Acid backing up from the stomach into the tube that moves food from the mouth to the stomach (gastroesophageal reflux).  Some medicines.  Lung problems.  Other medical conditions, such as heart failure or a blood clot in the lung (pulmonary embolism). Follow these instructions at home: Medicines  Take over-the-counter and prescription medicines only as told by your doctor.  Talk with your doctor before you take medicines that stop a cough (cough suppressants). Lifestyle  Do not smoke, and try not to be around smoke. Do not use any products that contain nicotine or tobacco, such as cigarettes, e-cigarettes, and chewing tobacco. If you need help quitting, ask your doctor.  Drink enough fluid to keep your pee (urine) pale yellow.  Avoid caffeine.  Do not drink alcohol if your doctor tells you not to drink.   General instructions  Watch for any changes in your cough. Tell your doctor about them.  Always cover your mouth when you cough.  Stay away from things that make you cough, such as perfume, candles, campfire smoke, or cleaning products.  If the air is dry, use a cool mist vaporizer or humidifier in your home.  If your cough is worse at night, try using extra pillows to raise your head up higher while you sleep.  Rest as needed.  Keep all follow-up visits as told by your doctor. This is important.   Contact a doctor if:  You have new symptoms.  You cough up pus.  Your cough does not get better after 2-3  weeks, or your cough gets worse.  Cough medicine does not help your cough and you are not sleeping well.  You have pain that gets worse or pain that is not helped with medicine.  You have a fever.  You are losing weight and you do not know why.  You have night sweats. Get help right away if:  You cough up blood.  You have trouble breathing.  Your heartbeat is very fast. These symptoms may be an emergency. Do not wait to see if the symptoms will go away. Get medical help right away. Call your local emergency services (911 in the [place] Do not drive yourself to the hospital. Summary  A cough helps to clear your throat and lungs. Many things can cause a cough.  Take over-the-counter and prescription medicines only as told by your doctor.  Always cover your mouth when you cough.  Contact a doctor if you have new symptoms or you have a cough that does not get better or gets worse. This information is not intended to replace advice given to you by your health care provider. Make sure you discuss any questions you have with your health care provider. Document Revised: 02/18/2019 Document Reviewed: 01/18/2018 Elsevier Patient Education  2021 [employer] Acute Bronchitis, Adult  Acute bronchitis is when air tubes in the lungs (bronchi) suddenly get swollen. The condition can make it hard for you to breathe. In adults, acute bronchitis usually goes away   within 2 weeks. A cough caused by bronchitis may last up to 3 weeks. Smoking, allergies, and asthma can make the condition worse. What are the causes? This condition is caused by:  Cold and flu viruses. The most common cause of this condition is the virus that causes the common cold.  Bacteria.  Substances that irritate the lungs, including: ? Smoke from cigarettes and other types of tobacco. ? Dust and pollen. ? Fumes from chemicals, gases, or burned fuel. ? Other materials that pollute indoor or outdoor air.  Close contact  with someone who has acute bronchitis. What increases the risk? The following factors may make you more likely to develop this condition:  A weak body's defense system. This is also called the immune system.  Any condition that affects your lungs and breathing, such as asthma. What are the signs or symptoms? Symptoms of this condition include:  A cough.  Coughing up clear, yellow, or green mucus.  Wheezing.  Chest congestion.  Shortness of breath.  A fever.  Body aches.  Chills.  A sore throat. How is this treated? Acute bronchitis may go away over time without treatment. Your doctor may recommend:  Drinking more fluids.  Taking a medicine for a fever or cough.  Using a device that gets medicine into your lungs (inhaler).  Using a vaporizer or a humidifier. These are machines that add water or moisture in the air to help with coughing and poor breathing. Follow these instructions at home: Activity  Get a lot of rest.  Avoid places where there are fumes from chemicals.  Return to your normal activities as told by your doctor. Ask your doctor what activities are safe for you. Lifestyle  Drink enough fluids to keep your pee (urine) pale yellow.  Do not drink alcohol.  Do not use any products that contain nicotine or tobacco, such as cigarettes, e-cigarettes, and chewing tobacco. If you need help quitting, ask your doctor. Be aware that: ? Your bronchitis will get worse if you smoke or breathe in other people's smoke (secondhand smoke). ? Your lungs will heal faster if you quit smoking. General instructions  Take over-the-counter and prescription medicines only as told by your doctor.  Use an inhaler, cool mist vaporizer, or humidifier as told by your doctor.  Rinse your mouth often with salt water. To make salt water, dissolve -1 tsp (3-6 g) of salt in 1 cup (237 mL) of warm water.  Keep all follow-up visits as told by your doctor. This is important.   How  is this prevented? To lower your risk of getting this condition again:  Wash your hands often with soap and water. If soap and water are not available, use hand sanitizer.  Avoid contact with people who have cold symptoms.  Try not to touch your mouth, nose, or eyes with your hands.  Make sure to get the flu shot every year.   Contact a doctor if:  Your symptoms do not get better in 2 weeks.  You vomit more than once or twice.  You have symptoms of loss of fluid from your body (dehydration). These include: ? Dark urine. ? Dry skin or eyes. ? Increased thirst. ? Headaches. ? Confusion. ? Muscle cramps. Get help right away if:  You cough up blood.  You have chest pain.  You have very bad shortness of breath.  You become dehydrated.  You faint or keep feeling like you are going to faint.  You keep vomiting.    You have a very bad headache.  Your fever or chills get worse. These symptoms may be an emergency. Do not wait to see if the symptoms will go away. Get medical help right away. Call your local emergency services (911 in the [place] Do not drive yourself to the hospital. Summary  Acute bronchitis is when air tubes in the lungs (bronchi) suddenly get swollen. In adults, acute bronchitis usually goes away within 2 weeks.  Take over-the-counter and prescription medicines only as told by your doctor.  Drink enough fluid to keep your pee (urine) pale yellow.  Contact a doctor if your symptoms do not improve after 2 weeks of treatment.  Get help right away if you cough up blood, faint, or have chest pain or shortness of breath. This information is not intended to replace advice given to you by your health care provider. Make sure you discuss any questions you have with your health care provider. Document Revised: 07/23/2018 Document Reviewed: 07/23/2018 Elsevier Patient Education  2021 Elsevier Inc.  

## 2020-04-26 NOTE — Progress Notes (Signed)
 Established Patient Office Visit  Subjective:  Patient ID: Kimberly Mcdonald, female    DOB: 04/25/1965  Age: 55 y.o. MRN: [id number]  CC: Follow up hiatal hernia; cough  HPI Kimberly Mcdonald is a 55 year old female who presents for follow up after incidental finding of hiatal hernia on CT for lung cancer screening. She denies any feelings of fullness, reflux, dysphagia or fatigue. She was seen in March for a GI bleed and was scoped by GI. They identified the source of the bleeding and she was taken off of her aspirin for the time being. She thought they fixed her hiatal hernia; however, she did not undergo surgical intervention and this was still present on her CT which was performed after her hospitalization. After being discharged, she returned to the ED on 04/08/20 for a nonintractable headache. Her CT was negative and due to low suspicion for intracranial etiology, she was given the option of further imaging with CTA or CTV. She declined further imaging and was instructed on symptoms to monitor for and return to the ED. Today, she denies any further episodes, vision changes, weakness, dizziness or lightheadedness. She also recently had a mammogram which identified a possible mass. She is scheduled for a follow up ultrasound to further investigate. She denies any family history of breast or cervical cancers. She had a pelvic exam and was noted to have a prolapsed cystocele. She does admit to some urinary incontinence as well. She is compliant with all of her current medications and continues to abstain from smoking. Her BP is well-controlled and she denies lower extremity swelling, chest pain, or palpitations.   Today, she reports a cough that has been present for 2 days. She reports clear phlegm associated with the cough, some shortness of breath and chest tightness. She states that the cough wakes her up at night and she has had occasional wheezing. She denies fever or chills, congestion or  seasonal allergies. She denies loss of taste or smell. She has received her COVID-19 vaccination series but has not received her booster. She has not used any OTC medications or supportive care measures. She rates her symptoms as mild to moderate. Overall, she is concerned about her cough but offers no further complaints.   Past Medical History:  Diagnosis Date  . Allergy    seasonal  . Anemia due to blood loss 05/08/2019  . Arthritis    knees and back  . Cerebrovascular accident (CVA) (HCC)   . HTN (hypertension)   . Hyperlipidemia   . Hypertension   . Stroke (HCC)   . 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 ABDOMINAL SURGERY    . COLONOSCOPY WITH PROPOFOL N/A 04/05/2020   Procedure: COLONOSCOPY WITH PROPOFOL;  Surgeon: Toledo, Teodoro K, MD;  Location: [facility] END[facility]COPY;  Service: Gastroenterology;  Laterality: N/A;  . ENDARTERECTOMY Left 05/13/2019   Procedure: ENDARTERECTOMY CAROTID;  Surgeon: Schnier, Gregory G, MD;  Location: [facility] ORS;  Service: Vascular;  Laterality: Left;  . ESOPHAGOGASTRODUODENOSCOPY (EGD) WITH PROPOFOL N/A 04/05/2020   Procedure: ESOPHAGOGASTRODUODENOSCOPY (EGD) WITH PROPOFOL;  Surgeon: Toledo, Teodoro K, MD;  Location: [facility] ENDOSCOPY;  Service: Gastroenterology;  Laterality: N/A;  . TUBAL LIGATION      Family History  Problem Relation Age of Onset  . Diabetes Mother   . Hypertension Mother   . Cancer Father   . Alcohol abuse Sister   . Breast cancer   Neg Hx     Social History   Socioeconomic History  . Marital status: Single    Spouse name: Not on file  . Number of children: 3  . Years of education: Not on file  . Highest education level: Not on file  Occupational History  . Occupation: unemployed  Tobacco Use  . Smoking status: Former Smoker    Packs/day: 1.00    Years: 38.00    Pack years: 38.00    Types: Cigarettes    Quit date: 04/03/2020    Years since quitting: 0.0  .  Smokeless tobacco: Never Used  Vaping Use  . Vaping Use: Never used  Substance and Sexual Activity  . Alcohol use: Not Currently    Comment: have not been drinking since surgery  . Drug use: Not Currently    Frequency: 3.0 times per week    Types: Marijuana    Comment: unsure exactly of use  . Sexual activity: Not Currently    Birth control/protection: None  Other Topics Concern  . Not on file  Social History Narrative   ** Merged History Encounter **       Right handed Drinks caffeine One story home   Social Determinants of Health   Financial Resource Strain: Not on file  Food Insecurity: Not on file  Transportation Needs: Unknown  . Lack of Transportation (Medical): No  . Lack of Transportation (Non-Medical): Not on file  Physical Activity: Unknown  . Days of Exercise per Week: 0 days  . Minutes of Exercise per Session: Not on file  Stress: Not on file  Social Connections: Not on file  Intimate Partner Violence: Unknown  . Fear of Current or Ex-Partner: No  . Emotionally Abused: Not on file  . Physically Abused: Not on file  . Sexually Abused: Not on file    Outpatient Medications Prior to Visit  Medication Sig Dispense Refill  . amLODipine (NORVASC) 10 MG tablet TAKE ONE TABLET BY MOUTH EVERY DAY 90 tablet 1  . aspirin 81 MG EC tablet TAKE ONE TABLET BY MOUTH EVERY DAY AT 6 IN THE MORNING 90 tablet 1  . atorvastatin (LIPITOR) 40 MG tablet TAKE ONE TABLET BY MOUTH EVERY DAY 90 tablet 0  . butalbital-acetaminophen-caffeine (FIORICET) 50-325-40 MG tablet Take 1 tablet by mouth every 6 (six) hours as needed for headache. 20 tablet 0  . cholecalciferol (VITAMIN D3) 25 MCG (1000 UNIT) tablet Take 1,000 Units by mouth daily.    . ferrous sulfate 325 (65 FE) MG tablet Take 325 mg by mouth daily.    . fluticasone (FLONASE) 50 MCG/ACT nasal spray PLACE 2 SPRAYS INTO BOTH NOSTRILS EVERY DAY 16 g 1  . pantoprazole (PROTONIX) 40 MG tablet Take 1 tablet (40 mg total) by mouth  daily. 90 tablet 0   No facility-administered medications prior to visit.    No Known Allergies  ROS Review of Systems  Constitutional: Negative.  Negative for chills, fatigue and fever.  HENT: Negative.  Negative for congestion, sinus pressure and sinus pain.   Respiratory: Positive for cough, chest tightness, shortness of breath and wheezing.   Cardiovascular: Negative.  Negative for chest pain.  Gastrointestinal: Negative.  Negative for abdominal distention, abdominal pain and blood in stool.  Genitourinary: Negative for difficulty urinating and pelvic pain.       Occasional episodes of incontinence  Musculoskeletal: Negative.   Skin: Negative.   Neurological: Negative.   Psychiatric/Behavioral: Negative.       Objective:  Physical Exam Constitutional:      General: She is not in acute distress.    Appearance: Normal appearance. She is obese. She is not toxic-appearing.  HENT:     Head: Normocephalic.     Right Ear: Tympanic membrane, ear canal and external ear normal.     Left Ear: Tympanic membrane, ear canal and external ear normal.     Nose: Nose normal.     Mouth/Throat:     Mouth: Mucous membranes are moist.     Pharynx: Oropharynx is clear.  Eyes:     Extraocular Movements: Extraocular movements intact.     Conjunctiva/sclera: Conjunctivae normal.     Pupils: Pupils are equal, round, and reactive to light.  Cardiovascular:     Rate and Rhythm: Normal rate and regular rhythm.     Pulses: Normal pulses.     Heart sounds: Normal heart sounds.  Pulmonary:     Effort: Pulmonary effort is normal. No tachypnea, accessory muscle usage, respiratory distress or retractions.     Breath sounds: Examination of the left-middle field reveals wheezing. Examination of the right-lower field reveals wheezing. Examination of the left-lower field reveals wheezing. Wheezing present.  Abdominal:     General: Bowel sounds are normal. There is no distension.     Palpations:  Abdomen is soft. There is no mass.     Tenderness: There is no abdominal tenderness.  Genitourinary:    Comments: Deferred per pt Musculoskeletal:        General: Normal range of motion.  Skin:    General: Skin is warm and dry.     Capillary Refill: Capillary refill takes less than 2 seconds.  Neurological:     Mental Status: She is alert and oriented to person, place, and time. Mental status is at baseline.  Psychiatric:        Mood and Affect: Mood normal.        Behavior: Behavior normal.     LMP 10/01/2016 (Approximate)  Wt Readings from Last 3 Encounters:  04/18/20 243 lb 11.2 oz (110.5 kg)  04/17/20 235 lb (106.6 kg)  04/08/20 240 lb (108.9 kg)     Health Maintenance Due  Topic Date Due  . Hepatitis C Screening  Never done  . COVID-19 Vaccine (1) Never done  . TETANUS/TDAP  Never done    There are no preventive care reminders to display for this patient.  Lab Results  Component Value Date   TSH 1.700 09/03/2017   Lab Results  Component Value Date   WBC 14.6 (H) 04/08/2020   HGB 8.8 (L) 04/08/2020   HCT 27.2 (L) 04/08/2020   MCV 93.2 04/08/2020   PLT 315 04/08/2020   Lab Results  Component Value Date   NA 139 04/08/2020   K 3.4 (L) 04/08/2020   CO2 24 04/08/2020   GLUCOSE 102 (H) 04/08/2020   BUN 9 04/08/2020   CREATININE 0.67 04/08/2020   BILITOT 0.8 04/08/2020   ALKPHOS 83 04/08/2020   AST 14 (L) 04/08/2020   ALT 7 04/08/2020   PROT 6.8 04/08/2020   ALBUMIN 3.4 (L) 04/08/2020   CALCIUM 8.5 (L) 04/08/2020   ANIONGAP 9 04/08/2020   Lab Results  Component Value Date   CHOL 162 06/16/2019   Lab Results  Component Value Date   HDL 53 06/16/2019   Lab Results  Component Value Date   LDLCALC 86 06/16/2019   Lab Results  Component Value Date   TRIG 131 06/16/2019   Lab   Results  Component Value Date   CHOLHDL 3.1 06/16/2019   Lab Results  Component Value Date   HGBA1C 5.2 05/09/2019      Assessment & Plan:   1. Lower respiratory  infection Pt in no acute distress and is non-toxic and afebrile. Suspicion for possible bacterial etiology due to history of smoking. She was educated to go to the ED if worsening symptoms, fever, chills, nausea or vomiting occurs. Notify if no improvement of symptoms occurs. Take abx in it's entirety. Supportive care measures discussed. Strongly advised COVID testing as this could not be ruled out based on exam. If no improvement or worsening of symptoms, will obtain chest x ray to r/o pneumonia or other pulmonary etiology.  - albuterol (VENTOLIN HFA) 108 (90 Base) MCG/ACT inhaler; Inhale 2 puffs into the lungs every 6 (six) hours as needed for wheezing or shortness of breath.  Dispense: 6.7 g; Refill: 0 - benzonatate (TESSALON PERLES) 100 MG capsule; Take 1 capsule (100 mg total) by mouth 3 (three) times daily as needed for cough.  Dispense: 20 capsule; Refill: 0 - amoxicillin-clavulanate (AUGMENTIN) 875-125 MG tablet; Take 1 tablet by mouth 2 (two) times daily for 7 days.  Dispense: 14 tablet; Refill: 0 - predniSONE (DELTASONE) 20 MG tablet; Take 1 tablet (20 mg total) by mouth daily with breakfast for 5 days.  Dispense: 5 tablet; Refill: 0 - CBC w/Diff; Future  2. Hiatal hernia No current related bothersome symptoms. Monitor for feelings of fullness, dysphagia, and hematochezia.  - Ambulatory referral to Gastroenterology  3. Cystocele with prolapse Follow up with [facility] uro[facility]ne[facility] for further evaluation and treatment. Could consider pelvic PT but will let GYN manage.  - Ambulatory referral to Gynecology  4. GI bleed not requiring more than 4 units of blood in 24 hours, ICU, or surgery Will re-evaluate labs and status of anemia. No apparent s/s of recurrent GI bleed. Pt is still not taking her aspirin. Follow up with GI regarding restarting aspirin therapy d/t hx of stroke. Go to the ED with any blood in stools, weakness, or lightheadedness. Avoid OTC NSAIDs.  - CBC w/Diff; Future - Comp Met  (CMET); Future  5. Hypertension, primary BP well-controlled. No reports of hypo or hypertension. Goal <140/90. Continue current medication regimen.    Follow-up: Return in two weeks (05/10/20), or if symptoms worsen or do not improve.     Katherine V Cobb, RN, BSN, FNP-S 

## 2020-04-30 ENCOUNTER — Ambulatory Visit
Admission: RE | Admit: 2020-04-30 | Discharge: 2020-04-30 | Disposition: A | Discharge status: Home / Self Care | Payer: Self-pay | Source: Ambulatory Visit | Attending: Oncology | Admitting: Oncology

## 2020-04-30 ENCOUNTER — Other Ambulatory Visit: Payer: Self-pay

## 2020-04-30 DIAGNOSIS — R920 Mammographic microcalcification found on diagnostic imaging of breast: Secondary | ICD-10-CM | POA: Insufficient documentation

## 2020-04-30 DIAGNOSIS — N63 Unspecified lump in unspecified breast: Secondary | ICD-10-CM

## 2020-05-01 ENCOUNTER — Other Ambulatory Visit: Payer: Self-pay | Admitting: *Deleted

## 2020-05-01 ENCOUNTER — Other Ambulatory Visit: Payer: Self-pay | Admitting: Internal Medicine

## 2020-05-01 DIAGNOSIS — R920 Mammographic microcalcification found on diagnostic imaging of breast: Secondary | ICD-10-CM

## 2020-05-02 ENCOUNTER — Other Ambulatory Visit: Payer: Self-pay | Admitting: *Deleted

## 2020-05-02 ENCOUNTER — Other Ambulatory Visit: Payer: Medicaid Other

## 2020-05-02 ENCOUNTER — Other Ambulatory Visit: Payer: Self-pay

## 2020-05-02 DIAGNOSIS — N63 Unspecified lump in unspecified breast: Secondary | ICD-10-CM

## 2020-05-02 DIAGNOSIS — K922 Gastrointestinal hemorrhage, unspecified: Secondary | ICD-10-CM

## 2020-05-02 DIAGNOSIS — J22 Unspecified acute lower respiratory infection: Secondary | ICD-10-CM

## 2020-05-03 ENCOUNTER — Telehealth: Payer: Self-pay | Admitting: *Deleted

## 2020-05-03 LAB — COMPREHENSIVE METABOLIC PANEL
ALT: 7 IU/L (ref 0–32)
AST: 9 IU/L (ref 0–40)
Albumin/Globulin Ratio: 1.5 (ref 1.2–2.2)
Albumin: 4.4 g/dL (ref 3.8–4.9)
Alkaline Phosphatase: 127 IU/L — ABNORMAL HIGH (ref 44–121)
BUN/Creatinine Ratio: 17 (ref 9–23)
BUN: 10 mg/dL (ref 6–24)
Bilirubin Total: 0.5 mg/dL (ref 0.0–1.2)
CO2: 23 mmol/L (ref 20–29)
Calcium: 9.5 mg/dL (ref 8.7–10.2)
Chloride: 105 mmol/L (ref 96–106)
Creatinine, Ser: 0.6 mg/dL (ref 0.57–1.00)
Globulin, Total: 3 g/dL (ref 1.5–4.5)
Glucose: 87 mg/dL (ref 65–99)
Potassium: 3.9 mmol/L (ref 3.5–5.2)
Sodium: 146 mmol/L — ABNORMAL HIGH (ref 134–144)
Total Protein: 7.4 g/dL (ref 6.0–8.5)
eGFR: 105 mL/min/{1.73_m2} (ref >59)

## 2020-05-03 LAB — CBC WITH DIFFERENTIAL/PLATELET
Basophils Absolute: 0.1 10*3/uL (ref 0.0–0.2)
Basos: 1 %
EOS (ABSOLUTE): 0.1 10*3/uL (ref 0.0–0.4)
Eos: 1 %
Hematocrit: 38.2 % (ref 34.0–46.6)
Hemoglobin: 11.8 g/dL (ref 11.1–15.9)
Immature Grans (Abs): 0.1 10*3/uL (ref 0.0–0.1)
Immature Granulocytes: 1 %
Lymphocytes Absolute: 4 10*3/uL — ABNORMAL HIGH (ref 0.7–3.1)
Lymphs: 41 %
MCH: 28.4 pg (ref 26.6–33.0)
MCHC: 30.9 g/dL — ABNORMAL LOW (ref 31.5–35.7)
MCV: 92 fL (ref 79–97)
Monocytes Absolute: 0.8 10*3/uL (ref 0.1–0.9)
Monocytes: 8 %
Neutrophils Absolute: 4.7 10*3/uL (ref 1.4–7.0)
Neutrophils: 48 %
Platelets: 442 10*3/uL (ref 150–450)
RBC: 4.15 x10E6/uL (ref 3.77–5.28)
RDW: 13.6 % (ref 11.7–15.4)
WBC: 9.6 10*3/uL (ref 3.4–10.8)

## 2020-05-03 NOTE — Telephone Encounter (Signed)
Called pt to schedule breast biopsy - She does not want to proceed with the biopsy.  She would prefer to wait another year to see if there are any changes. Pt was very adamant in refusing the biospy

## 2020-05-04 ENCOUNTER — Encounter: Payer: Self-pay | Admitting: *Deleted

## 2020-05-10 ENCOUNTER — Encounter: Payer: Self-pay | Admitting: *Deleted

## 2020-05-10 ENCOUNTER — Other Ambulatory Visit: Payer: Self-pay | Admitting: *Deleted

## 2020-05-10 DIAGNOSIS — N63 Unspecified lump in unspecified breast: Secondary | ICD-10-CM

## 2020-05-10 NOTE — Progress Notes (Signed)
Saw notes in Epic where [facility] from [facility] had tried to schedule patient's biopsy, but patient refused and wants another mammogram in 1 year.  Called patient and reviewed recommendations from the radiologist for biopsy.  Patient states she doesn't want a biopsy, but to come back for another mammogram.  Discussed with patient importance of close follow up with another mammogram in 6 months instead of waiting a year.  She is agreeable.  Bilateral diagnostic mammogram scheduled for November 01, 2020 at 10:20.  Mailed patient a letter with her appointment.

## 2020-05-15 ENCOUNTER — Ambulatory Visit: Payer: Medicaid Other | Admitting: Gerontology

## 2020-05-15 ENCOUNTER — Other Ambulatory Visit: Payer: Self-pay

## 2020-05-15 ENCOUNTER — Encounter: Payer: Self-pay | Admitting: Gerontology

## 2020-05-15 VITALS — BP 134/87 | HR 107 | Temp 98.2°F | Resp 18 | Ht 63.0 in | Wt 244.2 lb

## 2020-05-15 DIAGNOSIS — N814 Uterovaginal prolapse, unspecified: Secondary | ICD-10-CM

## 2020-05-15 DIAGNOSIS — R000 Tachycardia, unspecified: Secondary | ICD-10-CM

## 2020-05-15 DIAGNOSIS — K449 Diaphragmatic hernia without obstruction or gangrene: Secondary | ICD-10-CM

## 2020-05-15 NOTE — Progress Notes (Signed)
Established Patient Office Visit  Subjective:  Patient ID: Kimberly Mcdonald, female    DOB: [birth year]  Age: 55 y.o. MRN: [id number]  CC:  Chief Complaint  Patient presents with  . Follow-up    Lab results    HPI Kimberly Mcdonald is a 55 y/o female with history of Allergy, CVA, Hyperlipidemia, Hypertension who presents for routine follow up visit and lab review. She states that her cough has resolved and breathing is stable. Her lab was unremarkable. Her heart rate is elevated during visit and she states that she drinks water seldomly. She denies chest pain, palpitation, light headedness, fatigue and shortness of breath, She had breast ultrasound on 04/30/20 and it showed Indeterminate 1.5 cm group of coarse heterogeneous calcifications associated with a focal asymmetry in the UPPER INNER QUADRANT of the LEFT breast. There is no associated mass on ultrasound. 2. Likely benign 8 mm mass involving the UPPER INNER QUADRANT of the RIGHT breast, possibly a fibroadenoma. She declined Stereotactic tomosynthesis core needle biopsy of the left breast, but agreed for another Ultrasound in 6 months. She is yet to follow up with Gastroenterology for the evaluation of her hiatal hernia, though she denies abdominal pain, and constipation. Urogynecology referral was sent for the evaluation of her cystocele. She's complaint with her medications and continues to make healthy lifestyle changes. Overall, she states that she's doing well and offers no further complaint.   Past Medical History:  Diagnosis Date  . Allergy    seasonal  . Anemia due to blood loss 05/08/2019  . Arthritis    knees and back  . Cerebrovascular accident (CVA) ([place])   . HTN (hypertension)   . Hyperlipidemia   . Hypertension   . Stroke ([place])   . Substance abuse ([place])    Marijuana smoker x35 yrs    Past Surgical History:  Procedure Laterality Date  . ABDOMINAL SURGERY    . COLONOSCOPY WITH PROPOFOL N/A 04/05/2020    Procedure: COLONOSCOPY WITH PROPOFOL;  Surgeon: Toledo, Benay Pike, MD;  Location: [facility] ENDOSCOPY;  Service: Gastroenterology;  Laterality: N/A;  . ENDARTERECTOMY Left 05/13/2019   Procedure: ENDARTERECTOMY CAROTID;  Surgeon: Katha Cabal, MD;  Location: [facility] ORS;  Service: Vascular;  Laterality: Left;  . ESOPHAGOGASTRODUODENOSCOPY (EGD) WITH PROPOFOL N/A 04/05/2020   Procedure: ESOPHAGOGASTRODUODENOSCOPY (EGD) WITH PROPOFOL;  Surgeon: Toledo, Benay Pike, MD;  Location: [facility] ENDOSCOPY;  Service: Gastroenterology;  Laterality: N/A;  . TUBAL LIGATION      Family History  Problem Relation Age of Onset  . Diabetes Mother   . Hypertension Mother   . Cancer Father        leukemia  . Alcohol abuse Sister   . Breast cancer Neg Hx     Social History   Socioeconomic History  . Marital status: Single    Spouse name: Not on file  . Number of children: 3  . Years of education: Not on file  . Highest education level: Not on file  Occupational History  . Occupation: unemployed  Tobacco Use  . Smoking status: Former Smoker    Packs/day: 1.00    Years: 38.00    Pack years: 38.00    Types: Cigarettes    Quit date: 04/03/2020    Years since quitting: 0.1  . Smokeless tobacco: Never Used  Vaping Use  . Vaping Use: Never used  Substance and Sexual Activity  . Alcohol use: Not Currently    Comment: have not been drinking since surgery  .  Drug use: Yes    Frequency: 3.0 times per week    Types: Marijuana    Comment: unsure exactly of use  . Sexual activity: Not Currently    Birth control/protection: None  Other Topics Concern  . Not on file  Social History Narrative   ** Merged History Encounter **       Right handed Drinks caffeine One story home   Social Determinants of Health   Financial Resource Strain: Not on file  Food Insecurity: No Food Insecurity  . Worried About Charity fundraiser in the Last Year: Never true  . Ran Out of Food in the Last Year: Never true   Transportation Needs: No Transportation Needs  . Lack of Transportation (Medical): No  . Lack of Transportation (Non-Medical): No  Physical Activity: Unknown  . Days of Exercise per Week: 0 days  . Minutes of Exercise per Session: Not on file  Stress: Not on file  Social Connections: Not on file  Intimate Partner Violence: Unknown  . Fear of Current or Ex-Partner: No  . Emotionally Abused: Not on file  . Physically Abused: Not on file  . Sexually Abused: Not on file    Outpatient Medications Prior to Visit  Medication Sig Dispense Refill  . albuterol (VENTOLIN HFA) 108 (90 Base) MCG/ACT inhaler Inhale 2 puffs into the lungs every 6 (six) hours as needed for wheezing or shortness of breath. 6.7 g 0  . amLODipine (NORVASC) 10 MG tablet TAKE ONE TABLET BY MOUTH EVERY DAY 90 tablet 1  . atorvastatin (LIPITOR) 40 MG tablet TAKE ONE TABLET BY MOUTH EVERY DAY 90 tablet 0  . butalbital-acetaminophen-caffeine (FIORICET) 50-325-40 MG tablet Take 1 tablet by mouth every 6 (six) hours as needed for headache. 20 tablet 0  . cholecalciferol (VITAMIN D3) 25 MCG (1000 UNIT) tablet Take 1,000 Units by mouth daily.    . ferrous sulfate 325 (65 FE) MG tablet Take 325 mg by mouth daily.    . fluticasone (FLONASE) 50 MCG/ACT nasal spray PLACE 2 SPRAYS INTO BOTH NOSTRILS EVERY DAY 16 g 1  . pantoprazole (PROTONIX) 40 MG tablet Take 1 tablet (40 mg total) by mouth daily. 90 tablet 0  . aspirin 81 MG EC tablet TAKE ONE TABLET BY MOUTH EVERY DAY AT 6 IN THE MORNING (Patient not taking: No sig reported) 90 tablet 1  . benzonatate (TESSALON PERLES) 100 MG capsule Take 1 capsule (100 mg total) by mouth 3 (three) times daily as needed for cough. 20 capsule 0   No facility-administered medications prior to visit.    No Known Allergies  ROS Review of Systems  Constitutional: Negative.   Respiratory: Negative.   Cardiovascular: Negative.   Gastrointestinal: Negative.   Endocrine: Negative.   Neurological:  Negative.   Psychiatric/Behavioral: Negative.       Objective:    Physical Exam HENT:     Head: Normocephalic and atraumatic.  Cardiovascular:     Rate and Rhythm: Normal rate and regular rhythm.     Pulses: Normal pulses.     Heart sounds: Normal heart sounds.  Pulmonary:     Effort: Pulmonary effort is normal.     Breath sounds: Normal breath sounds.  Abdominal:     General: Bowel sounds are normal.     Palpations: Abdomen is soft.  Skin:    General: Skin is warm.  Neurological:     General: No focal deficit present.     Mental Status: She is alert and  oriented to person, place, and time. Mental status is at baseline.  Psychiatric:        Mood and Affect: Mood normal.        Behavior: Behavior normal.        Thought Content: Thought content normal.        Judgment: Judgment normal.     BP 134/87 (BP Location: Right Arm, Patient Position: Sitting, Cuff Size: Large)   Pulse (!) 107   Temp 98.2 F (36.8 C)   Resp 18   Ht 5' 3"  (1.6 m)   Wt 244 lb 3.2 oz (110.8 kg)   LMP 10/01/2016 (Approximate)   SpO2 95%   BMI 43.26 kg/m  Wt Readings from Last 3 Encounters:  05/15/20 244 lb 3.2 oz (110.8 kg)  04/26/20 239 lb 4.8 oz (108.5 kg)  04/18/20 243 lb 11.2 oz (110.5 kg)   Weight loss encouraged  Health Maintenance Due  Topic Date Due  . Hepatitis C Screening  Never done  . COVID-19 Vaccine (1) Never done  . TETANUS/TDAP  Never done    There are no preventive care reminders to display for this patient.  Lab Results  Component Value Date   TSH 1.700 09/03/2017   Lab Results  Component Value Date   WBC 9.6 05/02/2020   HGB 11.8 05/02/2020   HCT 38.2 05/02/2020   MCV 92 05/02/2020   PLT 442 05/02/2020   Lab Results  Component Value Date   NA 146 (H) 05/02/2020   K 3.9 05/02/2020   CO2 23 05/02/2020   GLUCOSE 87 05/02/2020   BUN 10 05/02/2020   CREATININE 0.60 05/02/2020   BILITOT 0.5 05/02/2020   ALKPHOS 127 (H) 05/02/2020   AST 9 05/02/2020   ALT  7 05/02/2020   PROT 7.4 05/02/2020   ALBUMIN 4.4 05/02/2020   CALCIUM 9.5 05/02/2020   ANIONGAP 9 04/08/2020   EGFR 105 05/02/2020   Lab Results  Component Value Date   CHOL 162 06/16/2019   Lab Results  Component Value Date   HDL 53 06/16/2019   Lab Results  Component Value Date   LDLCALC 86 06/16/2019   Lab Results  Component Value Date   TRIG 131 06/16/2019   Lab Results  Component Value Date   CHOLHDL 3.1 06/16/2019   Lab Results  Component Value Date   HGBA1C 5.2 05/09/2019      Assessment & Plan:   1. Cystocele with prolapse -She will follow up at [facility]rogynecology office. - Ambulatory referral to Urogynecology  2. Hiatal hernia -She will follow up with Gastroenterology. - Ambulatory referral to Gastroenterology  3. Heart rate fast - Her heart rate was elevated, was advised to increase water intake and to notify clinic or go to the ED for worsening symptoms.     Follow-up: Return in about 29 days (around 06/13/2020), or if symptoms worsen or fail to improve.    Donia Yokum Jerold Coombe, NP

## 2020-06-01 ENCOUNTER — Other Ambulatory Visit: Payer: Self-pay

## 2020-06-05 ENCOUNTER — Encounter: Payer: Self-pay | Admitting: *Deleted

## 2020-06-06 ENCOUNTER — Other Ambulatory Visit (INDEPENDENT_AMBULATORY_CARE_PROVIDER_SITE_OTHER): Payer: Self-pay | Admitting: Vascular Surgery

## 2020-06-06 DIAGNOSIS — K551 Chronic vascular disorders of intestine: Secondary | ICD-10-CM

## 2020-06-06 DIAGNOSIS — I6522 Occlusion and stenosis of left carotid artery: Secondary | ICD-10-CM

## 2020-06-07 ENCOUNTER — Encounter (INDEPENDENT_AMBULATORY_CARE_PROVIDER_SITE_OTHER): Payer: Medicaid Other

## 2020-06-07 ENCOUNTER — Ambulatory Visit (INDEPENDENT_AMBULATORY_CARE_PROVIDER_SITE_OTHER): Payer: Medicaid Other | Admitting: Nurse Practitioner

## 2020-06-13 ENCOUNTER — Other Ambulatory Visit: Payer: Self-pay

## 2020-06-13 ENCOUNTER — Ambulatory Visit: Payer: Medicaid Other | Admitting: Gerontology

## 2020-06-13 ENCOUNTER — Encounter: Payer: Self-pay | Admitting: Gerontology

## 2020-06-13 VITALS — BP 128/90 | HR 90 | Temp 96.8°F | Resp 16 | Ht 63.0 in | Wt 243.8 lb

## 2020-06-13 DIAGNOSIS — Z8379 Family history of other diseases of the digestive system: Secondary | ICD-10-CM

## 2020-06-13 DIAGNOSIS — E1169 Type 2 diabetes mellitus with other specified complication: Secondary | ICD-10-CM

## 2020-06-13 DIAGNOSIS — R000 Tachycardia, unspecified: Secondary | ICD-10-CM

## 2020-06-13 MED ORDER — PANTOPRAZOLE SODIUM 40 MG PO TBEC
40.0000 mg | DELAYED_RELEASE_TABLET | Freq: Every day | ORAL | 0 refills | Status: DC
  Filled 2020-06-13: qty 90, 90d supply, fill #0

## 2020-06-13 MED ORDER — ATORVASTATIN CALCIUM 40 MG PO TABS
ORAL_TABLET | Freq: Every day | ORAL | 0 refills | Status: DC
Start: 2020-06-13 — End: 2020-12-25
  Filled 2020-06-13: qty 90, 90d supply, fill #0

## 2020-06-13 NOTE — Patient Instructions (Signed)

## 2020-06-13 NOTE — Progress Notes (Signed)
Established Patient Office Visit  Subjective:  Patient ID: Kimberly Mcdonald, female    DOB: [birth year]  Age: 55 y.o. MRN: [id number]  CC:  Chief Complaint  Patient presents with  . Follow-up  . Hypertension    HPI Kimberly Mcdonald  is a 55 y/o female with history of Allergy, CVA, Hyperlipidemia, Hypertension who presents for routine follow up visit for elevated heart rate and medication refill. She states that she has increased her water intake. She denies chest pain, palpitation and light headedness. She states that she's yet to follow up with Urogynecology for her Cystocele and Surgery for Hernia repair. Overall, she states that she's doing well and offers no further complaint.  Past Medical History:  Diagnosis Date  . Allergy    seasonal  . Anemia due to blood loss 05/08/2019  . Arthritis    knees and back  . Cerebrovascular accident (CVA) ([place])   . HTN (hypertension)   . Hyperlipidemia   . Hypertension   . Stroke ([place])   . Substance abuse ([place])    Marijuana smoker x35 yrs    Past Surgical History:  Procedure Laterality Date  . ABDOMINAL SURGERY    . COLONOSCOPY WITH PROPOFOL N/A 04/05/2020   Procedure: COLONOSCOPY WITH PROPOFOL;  Surgeon: Toledo, Benay Pike, MD;  Location: [facility] END[facility]COPY;  Service: Gastroenterology;  Laterality: N/A;  . ENDARTERECTOMY Left 05/13/2019   Procedure: ENDARTERECTOMY CAROTID;  Surgeon: Katha Cabal, MD;  Location: [facility] ORS;  Service: Vascular;  Laterality: Left;  . ESOPHAGOGASTRODUODENOSCOPY (EGD) WITH PROPOFOL N/A 04/05/2020   Procedure: ESOPHAGOGASTRODUODENOSCOPY (EGD) WITH PROPOFOL;  Surgeon: Toledo, Benay Pike, MD;  Location: [facility] END[facility];  Service: Gastroenterology;  Laterality: N/A;  . TUBAL LIGATION      Family History  Problem Relation Age of Onset  . Diabetes Mother   . Hypertension Mother   . Cancer Father        leukemia  . Alcohol abuse Sister   . Breast cancer Neg Hx     Social History   Socioeconomic  History  . Marital status: Single    Spouse name: Not on file  . Number of children: 3  . Years of education: Not on file  . Highest education level: Not on file  Occupational History  . Occupation: unemployed  Tobacco Use  . Smoking status: Former Smoker    Packs/day: 1.00    Years: 38.00    Pack years: 38.00    Types: Cigarettes    Quit date: 04/03/2020    Years since quitting: 0.1  . Smokeless tobacco: Never Used  Vaping Use  . Vaping Use: Never used  Substance and Sexual Activity  . Alcohol use: Not Currently    Comment: have not been drinking since surgery 04/2019  . Drug use: Yes    Frequency: 3.0 times per week    Types: Marijuana    Comment: unsure exactly of use  . Sexual activity: Not Currently    Birth control/protection: None  Other Topics Concern  . Not on file  Social History Narrative   ** Merged History Encounter **       Right handed Drinks caffeine One story home   Social Determinants of Health   Financial Resource Strain: Not on file  Food Insecurity: No Food Insecurity  . Worried About Charity fundraiser in the Last Year: Never true  . Ran Out of Food in the Last Year: Never true  Transportation Needs: No Transportation Needs  .  Lack of Transportation (Medical): No  . Lack of Transportation (Non-Medical): No  Physical Activity: Unknown  . Days of Exercise per Week: 0 days  . Minutes of Exercise per Session: Not on file  Stress: Not on file  Social Connections: Not on file  Intimate Partner Violence: Unknown  . Fear of Current or Ex-Partner: No  . Emotionally Abused: Not on file  . Physically Abused: Not on file  . Sexually Abused: Not on file    Outpatient Medications Prior to Visit  Medication Sig Dispense Refill  . albuterol (VENTOLIN HFA) 108 (90 Base) MCG/ACT inhaler Inhale 2 puffs into the lungs every 6 (six) hours as needed for wheezing or shortness of breath. 6.7 g 0  . amLODipine (NORVASC) 10 MG tablet TAKE ONE TABLET BY MOUTH  EVERY DAY 90 tablet 1  . butalbital-acetaminophen-caffeine (FIORICET) 50-325-40 MG tablet Take 1 tablet by mouth every 6 (six) hours as needed for headache. 20 tablet 0  . cholecalciferol (VITAMIN D3) 25 MCG (1000 UNIT) tablet Take 1,000 Units by mouth daily.    . ferrous sulfate 325 (65 FE) MG tablet Take 325 mg by mouth daily.    . fluticasone (FLONASE) 50 MCG/ACT nasal spray PLACE 2 SPRAYS INTO BOTH NOSTRILS EVERY DAY 16 g 1  . atorvastatin (LIPITOR) 40 MG tablet TAKE ONE TABLET BY MOUTH EVERY DAY 90 tablet 0  . pantoprazole (PROTONIX) 40 MG tablet Take 1 tablet (40 mg total) by mouth daily. 90 tablet 0  . aspirin 81 MG EC tablet TAKE ONE TABLET BY MOUTH EVERY DAY AT 6 IN THE MORNING 90 tablet 1   No facility-administered medications prior to visit.    No Known Allergies  ROS Review of Systems  Constitutional: Negative.   Eyes: Negative.   Respiratory: Negative.   Cardiovascular: Negative.   Skin: Negative.   Neurological: Negative.   Psychiatric/Behavioral: Negative.       Objective:    Physical Exam HENT:     Head: Normocephalic and atraumatic.  Eyes:     Extraocular Movements: Extraocular movements intact.     Conjunctiva/sclera: Conjunctivae normal.     Pupils: Pupils are equal, round, and reactive to light.  Cardiovascular:     Rate and Rhythm: Normal rate and regular rhythm.     Pulses: Normal pulses.     Heart sounds: Normal heart sounds.  Pulmonary:     Effort: Pulmonary effort is normal.     Breath sounds: Normal breath sounds.  Skin:    General: Skin is warm.  Neurological:     General: No focal deficit present.     Mental Status: She is alert and oriented to person, place, and time. Mental status is at baseline.  Psychiatric:        Mood and Affect: Mood normal.        Behavior: Behavior normal.        Thought Content: Thought content normal.        Judgment: Judgment normal.     BP 128/90 (BP Location: Right Arm, Patient Position: Sitting, Cuff  Size: Large)   Pulse 90   Temp (!) 96.8 F (36 C)   Resp 16   Ht 5' 3" (1.6 m)   Wt 243 lb 12.8 oz (110.6 kg)   LMP 10/01/2016 (Approximate)   SpO2 97%   BMI 43.19 kg/m  Wt Readings from Last 3 Encounters:  06/13/20 243 lb 12.8 oz (110.6 kg)  05/15/20 244 lb 3.2 oz (110.8 kg)  04/26/20   239 lb 4.8 oz (108.5 kg)   Weight loss was encouraged  Health Maintenance Due  Topic Date Due  . COVID-19 Vaccine (1) Never done  . Hepatitis C Screening  Never done  . TETANUS/TDAP  Never done  . Zoster Vaccines- Shingrix (1 of 2) Never done    There are no preventive care reminders to display for this patient.  Lab Results  Component Value Date   TSH 1.700 09/03/2017   Lab Results  Component Value Date   WBC 9.6 05/02/2020   HGB 11.8 05/02/2020   HCT 38.2 05/02/2020   MCV 92 05/02/2020   PLT 442 05/02/2020   Lab Results  Component Value Date   NA 146 (H) 05/02/2020   K 3.9 05/02/2020   CO2 23 05/02/2020   GLUCOSE 87 05/02/2020   BUN 10 05/02/2020   CREATININE 0.60 05/02/2020   BILITOT 0.5 05/02/2020   ALKPHOS 127 (H) 05/02/2020   AST 9 05/02/2020   ALT 7 05/02/2020   PROT 7.4 05/02/2020   ALBUMIN 4.4 05/02/2020   CALCIUM 9.5 05/02/2020   ANIONGAP 9 04/08/2020   EGFR 105 05/02/2020   Lab Results  Component Value Date   CHOL 162 06/16/2019   Lab Results  Component Value Date   HDL 53 06/16/2019   Lab Results  Component Value Date   LDLCALC 86 06/16/2019   Lab Results  Component Value Date   TRIG 131 06/16/2019   Lab Results  Component Value Date   CHOLHDL 3.1 06/16/2019   Lab Results  Component Value Date   HGBA1C 5.2 05/09/2019      Assessment & Plan:    1. Hyperlipidemia associated with type 2 diabetes mellitus ([place]) - She will continue on current medication, low fat/ cholesterol diet and exercise as tolerated. - atorvastatin (LIPITOR) 40 MG tablet; TAKE ONE TABLET BY MOUTH EVERY DAY  Dispense: 90 tablet; Refill: 0  2. Family history of  GERD - Her acid reflux is under control and will continue on current medication. -Avoid spicy, fatty and fried food -Avoid sodas and sour juices -Avoid heavy meals -Avoid eating 4 hours before bedtime -Elevate head of bed at night - pantoprazole (PROTONIX) 40 MG tablet; Take 1 tablet (40 mg total) by mouth daily.  Dispense: 90 tablet; Refill: 0  3. Heart rate fast - Her heart rate is within normal limits. She was encouraged to increase her water intake.    Follow-up: Return in about 6 weeks (around 07/25/2020), or if symptoms worsen or fail to improve.    Isiaha Greenup Jerold Coombe, NP

## 2020-07-25 ENCOUNTER — Ambulatory Visit: Payer: Medicaid Other | Admitting: Gerontology

## 2020-08-23 ENCOUNTER — Ambulatory Visit: Payer: Medicaid Other

## 2020-08-30 MED FILL — Amlodipine Besylate Tab 10 MG (Base Equivalent): ORAL | 90 days supply | Qty: 90 | Fill #0 | Status: AC

## 2020-08-31 ENCOUNTER — Other Ambulatory Visit: Payer: Self-pay

## 2020-09-21 ENCOUNTER — Emergency Department: Payer: 59

## 2020-09-21 ENCOUNTER — Other Ambulatory Visit: Payer: Self-pay

## 2020-09-21 ENCOUNTER — Encounter: Payer: Self-pay | Admitting: Emergency Medicine

## 2020-09-21 ENCOUNTER — Observation Stay
Admission: EM | Admit: 2020-09-21 | Discharge: 2020-09-22 | Disposition: A | Discharge status: Home / Self Care | Payer: 59 | Attending: Internal Medicine | Admitting: Internal Medicine

## 2020-09-21 DIAGNOSIS — Z8673 Personal history of transient ischemic attack (TIA), and cerebral infarction without residual deficits: Secondary | ICD-10-CM | POA: Insufficient documentation

## 2020-09-21 DIAGNOSIS — K3189 Other diseases of stomach and duodenum: Secondary | ICD-10-CM | POA: Diagnosis not present

## 2020-09-21 DIAGNOSIS — K922 Gastrointestinal hemorrhage, unspecified: Secondary | ICD-10-CM | POA: Diagnosis not present

## 2020-09-21 DIAGNOSIS — Z87891 Personal history of nicotine dependence: Secondary | ICD-10-CM | POA: Insufficient documentation

## 2020-09-21 DIAGNOSIS — K625 Hemorrhage of anus and rectum: Secondary | ICD-10-CM | POA: Diagnosis present

## 2020-09-21 DIAGNOSIS — E785 Hyperlipidemia, unspecified: Secondary | ICD-10-CM

## 2020-09-21 DIAGNOSIS — K921 Melena: Secondary | ICD-10-CM | POA: Diagnosis not present

## 2020-09-21 DIAGNOSIS — I10 Essential (primary) hypertension: Secondary | ICD-10-CM | POA: Diagnosis not present

## 2020-09-21 DIAGNOSIS — R000 Tachycardia, unspecified: Secondary | ICD-10-CM | POA: Diagnosis present

## 2020-09-21 DIAGNOSIS — Z20822 Contact with and (suspected) exposure to covid-19: Secondary | ICD-10-CM | POA: Diagnosis not present

## 2020-09-21 DIAGNOSIS — D72829 Elevated white blood cell count, unspecified: Secondary | ICD-10-CM | POA: Diagnosis present

## 2020-09-21 DIAGNOSIS — Z79899 Other long term (current) drug therapy: Secondary | ICD-10-CM | POA: Diagnosis not present

## 2020-09-21 DIAGNOSIS — Z720 Tobacco use: Secondary | ICD-10-CM | POA: Diagnosis present

## 2020-09-21 DIAGNOSIS — Z8379 Family history of other diseases of the digestive system: Secondary | ICD-10-CM

## 2020-09-21 DIAGNOSIS — K449 Diaphragmatic hernia without obstruction or gangrene: Secondary | ICD-10-CM | POA: Diagnosis not present

## 2020-09-21 DIAGNOSIS — D729 Disorder of white blood cells, unspecified: Secondary | ICD-10-CM | POA: Insufficient documentation

## 2020-09-21 DIAGNOSIS — I639 Cerebral infarction, unspecified: Secondary | ICD-10-CM | POA: Diagnosis present

## 2020-09-21 DIAGNOSIS — K760 Fatty (change of) liver, not elsewhere classified: Secondary | ICD-10-CM | POA: Diagnosis present

## 2020-09-21 DIAGNOSIS — D649 Anemia, unspecified: Secondary | ICD-10-CM | POA: Diagnosis present

## 2020-09-21 DIAGNOSIS — E66813 Obesity, class 3: Secondary | ICD-10-CM | POA: Diagnosis present

## 2020-09-21 LAB — COMPREHENSIVE METABOLIC PANEL
ALT: 10 U/L (ref 0–44)
AST: 16 U/L (ref 15–41)
Albumin: 3.4 g/dL — ABNORMAL LOW (ref 3.5–5.0)
Alkaline Phosphatase: 88 U/L (ref 38–126)
Anion gap: 6 (ref 5–15)
BUN: 30 mg/dL — ABNORMAL HIGH (ref 6–20)
CO2: 23 mmol/L (ref 22–32)
Calcium: 9.1 mg/dL (ref 8.9–10.3)
Chloride: 110 mmol/L (ref 98–111)
Creatinine, Ser: 0.52 mg/dL (ref 0.44–1.00)
GFR, Estimated: >60 mL/min (ref >60)
Glucose, Bld: 133 mg/dL — ABNORMAL HIGH (ref 70–99)
Potassium: 4.5 mmol/L (ref 3.5–5.1)
Sodium: 139 mmol/L (ref 135–145)
Total Bilirubin: 1 mg/dL (ref 0.3–1.2)
Total Protein: 7.2 g/dL (ref 6.5–8.1)

## 2020-09-21 LAB — CBC
HCT: 34.8 % — ABNORMAL LOW (ref 36.0–46.0)
Hemoglobin: 11.1 g/dL — ABNORMAL LOW (ref 12.0–15.0)
MCH: 28.8 pg (ref 26.0–34.0)
MCHC: 31.9 g/dL (ref 30.0–36.0)
MCV: 90.2 fL (ref 80.0–100.0)
Platelets: 368 10*3/uL (ref 150–400)
RBC: 3.86 MIL/uL — ABNORMAL LOW (ref 3.87–5.11)
RDW: 15.1 % (ref 11.5–15.5)
WBC: 11.5 10*3/uL — ABNORMAL HIGH (ref 4.0–10.5)
nRBC: 0 % (ref 0.0–0.2)

## 2020-09-21 LAB — TYPE AND SCREEN
ABO/RH(D): O POS
Antibody Screen: NEGATIVE

## 2020-09-21 LAB — RESP PANEL BY RT-PCR (FLU A&B, COVID) ARPGX2
Influenza A by PCR: NEGATIVE
Influenza B by PCR: NEGATIVE
SARS Coronavirus 2 by RT PCR: NEGATIVE

## 2020-09-21 LAB — HEMOGLOBIN AND HEMATOCRIT, BLOOD
HCT: 34.5 % — ABNORMAL LOW (ref 36.0–46.0)
Hemoglobin: 10.4 g/dL — ABNORMAL LOW (ref 12.0–15.0)

## 2020-09-21 LAB — MAGNESIUM: Magnesium: 1.9 mg/dL (ref 1.7–2.4)

## 2020-09-21 MED ORDER — ACETAMINOPHEN 650 MG RE SUPP: 650.0000 mg | Freq: Four times a day (QID) | RECTAL | Status: DC | PRN

## 2020-09-21 MED ORDER — ATORVASTATIN CALCIUM 20 MG PO TABS
40.0000 mg | ORAL_TABLET | Freq: Every day | ORAL | Status: DC
  Administered 2020-09-21: 23:00:00 40 mg via ORAL
  Filled 2020-09-21: qty 2

## 2020-09-21 MED ORDER — ONDANSETRON HCL 4 MG PO TABS
4.0000 mg | ORAL_TABLET | Freq: Four times a day (QID) | ORAL | Status: DC | PRN
Start: 2020-09-21 — End: 2020-09-22

## 2020-09-21 MED ORDER — LACTATED RINGERS IV BOLUS: 1000.0000 mL | Freq: Once | INTRAVENOUS | Status: DC

## 2020-09-21 MED ORDER — PANTOPRAZOLE SODIUM 40 MG IV SOLR: 40.0000 mg | Freq: Two times a day (BID) | INTRAVENOUS | Status: DC

## 2020-09-21 MED ORDER — PANTOPRAZOLE INFUSION (NEW) - SIMPLE MED
8.0000 mg/h | INTRAVENOUS | Status: DC
  Administered 2020-09-21 – 2020-09-22 (×2): 8 mg/h via INTRAVENOUS
  Filled 2020-09-21 (×3): qty 100

## 2020-09-21 MED ORDER — ALBUTEROL SULFATE (2.5 MG/3ML) 0.083% IN NEBU: 2.5000 mg | INHALATION_SOLUTION | Freq: Four times a day (QID) | RESPIRATORY_TRACT | Status: DC | PRN

## 2020-09-21 MED ORDER — ACETAMINOPHEN 325 MG PO TABS: 650.0000 mg | ORAL_TABLET | Freq: Four times a day (QID) | ORAL | Status: DC | PRN

## 2020-09-21 MED ORDER — FLUTICASONE PROPIONATE 50 MCG/ACT NA SUSP
2.0000 | Freq: Every day | NASAL | Status: DC | PRN
  Filled 2020-09-21: qty 16

## 2020-09-21 MED ORDER — SODIUM CHLORIDE 0.9 % IV BOLUS
1000.0000 mL | Freq: Once | INTRAVENOUS | Status: AC
  Administered 2020-09-21: 1000 mL via INTRAVENOUS

## 2020-09-21 MED ORDER — ONDANSETRON HCL 4 MG/2ML IJ SOLN
4.0000 mg | Freq: Four times a day (QID) | INTRAMUSCULAR | Status: DC | PRN
Start: 2020-09-21 — End: 2020-09-22

## 2020-09-21 MED ORDER — ONDANSETRON HCL 4 MG/2ML IJ SOLN
4.0000 mg | Freq: Once | INTRAMUSCULAR | Status: AC
  Administered 2020-09-21: 4 mg via INTRAVENOUS
  Filled 2020-09-21: qty 2

## 2020-09-21 MED ORDER — FERROUS SULFATE 325 (65 FE) MG PO TABS
325.0000 mg | ORAL_TABLET | Freq: Every day | ORAL | Status: DC
  Administered 2020-09-21: 19:00:00 325 mg via ORAL
  Filled 2020-09-21 (×2): qty 1

## 2020-09-21 MED ORDER — BUTALBITAL-APAP-CAFFEINE 50-325-40 MG PO TABS: 1.0000 | ORAL_TABLET | Freq: Four times a day (QID) | ORAL | Status: DC | PRN

## 2020-09-21 MED ORDER — IOHEXOL 350 MG/ML SOLN
100.0000 mL | Freq: Once | INTRAVENOUS | Status: AC | PRN
  Administered 2020-09-21: 100 mL via INTRAVENOUS
  Filled 2020-09-21: qty 100

## 2020-09-21 MED ORDER — PANTOPRAZOLE INFUSION (NEW) - SIMPLE MED
8.0000 mg/h | INTRAVENOUS | Status: DC
  Administered 2020-09-21: 8 mg/h via INTRAVENOUS
  Filled 2020-09-21: qty 80

## 2020-09-21 MED ORDER — PANTOPRAZOLE 80MG IVPB - SIMPLE MED
80.0000 mg | Freq: Once | INTRAVENOUS | Status: AC
  Administered 2020-09-21: 80 mg via INTRAVENOUS
  Filled 2020-09-21: qty 80

## 2020-09-21 MED ORDER — LACTATED RINGERS IV SOLN: INTRAVENOUS | Status: AC

## 2020-09-21 NOTE — ED Triage Notes (Signed)
Pt reports that she has several blood transfusions for rectal bleeding. This am she woke up and had dark tarry stools. She reports this how they always start out.

## 2020-09-21 NOTE — ED Provider Notes (Signed)
[facility] Emergency Department Provider Note  _________________________[facility]___________________   Event Date/Time   First MD Initiated Contact with Patient 09/21/20 1135     (approximate)  I have reviewed the triage vital signs and the nursing notes.   HISTORY  Chief Complaint Rectal Bleeding   HPI Kimberly Mcdonald is a 55 y.o. female with past medical history of superior mesenteric artery stenosis, arthritis, HTN, HDL, CVA, and blood loss anemia secondary to some GI bleeding and iron deficiency anemia having been admitted earlier this year in March who presents for assessment of approximately 1 day of dark stools.  Patient states she does not take any blood thinners and has been over a month and she is taking iron.  She nurses little nausea today but has not had any vomiting, abdominal pain, chest pain, cough, shortness of breath, back pain, painful urination, headache, earache, sore throat or lightheadedness.  No other acute concerns at this time.  She states this is exactly what happened last time she was admitted and got a blood transfusion.  Did not take her Protonix today.         Past Medical History:  Diagnosis Date   Allergy    seasonal   Anemia due to blood loss 05/08/2019   Arthritis    knees and back   Cerebrovascular accident (CVA) (HCC)    HTN (hypertension)    Hyperlipidemia    Hypertension    Stroke [facility](HCC)    Substance abuse (HCC)    Marijuana smoker x35 yrs    Patient Active Problem List   Diagnosis Date Noted   Heart rate fast 05/15/2020   Acute upper GI bleed 04/05/2020   Acute upper GI bleeding 04/04/2020   Obesity, Class III, BMI 40-49.9 (morbid obesity) (HCC) 04/04/2020   Bilateral knee pain 03/28/2020   Sinusitis 02/29/2020   Carotid stenosis 06/07/2019   Cerebrovascular accident (CVA) (HCC)    Right arm numbness 05/08/2019   GIB (gastrointestinal bleeding) 05/08/2019   Symptomatic anemia 05/08/2019   Tobacco abuse  05/08/2019   Hypokalemia 05/08/2019   Hypertension 09/03/2017   Arthritis 09/03/2017   Healthcare maintenance 09/03/2017   Low back pain 09/03/2017    Past Surgical History:  Procedure Laterality Date   ABDOMINAL SURGERY     COLONOSCOPY WITH PROPOFOL N/A 04/05/2020   Procedure: COLONOSCOPY WITH PROPOFOL;  Surgeon: Toledo, Boykin Nearingeodoro K, MD;  Location: [facility] ENDOSCOPY;  Service: Gastroenterology;  Laterality: N/A;   ENDARTERECTOMY Left 05/13/2019   Procedure: ENDARTERECTOMY CAROTID;  Surgeon: Renford DillsSchnier, Gregory G, MD;  Location: [facility] ORS;  Service: Vascular;  Laterality: Left;   ESOPHAGOGASTRODUODENOSCOPY (EGD) WITH PROPOFOL N/A 04/05/2020   Procedure: ESOPHAGOGASTRODUODENOSCOPY (EGD) WITH PROPOFOL;  Surgeon: Toledo, Boykin Nearingeodoro K, MD;  Location: [facility] ENDOSCOPY;  Service: Gastroenterology;  Laterality: N/A;   TUBAL LIGATION      Prior to Admission medications   Medication Sig Start Date End Date Taking? Authorizing Provider  albuterol (VENTOLIN HFA) 108 (90 Base) MCG/ACT inhaler Inhale 2 puffs into the lungs every 6 (six) hours as needed for wheezing or shortness of breath. 04/26/20   Iloabachie, Chioma E, NP  amLODipine (NORVASC) 10 MG tablet TAKE ONE TABLET BY MOUTH EVERY DAY 02/29/20 02/28/21  Iloabachie, Chioma E, NP  atorvastatin (LIPITOR) 40 MG tablet TAKE ONE TABLET BY MOUTH EVERY DAY 06/13/20 05/28/21  Iloabachie, Chioma E, NP  butalbital-acetaminophen-caffeine (FIORICET) 50-325-40 MG tablet Take 1 tablet by mouth every 6 (six) hours as needed for headache. 04/08/20 04/08/21  Page Spiroodgers, Caitlin  J, PA  cholecalciferol (VITAMIN D3) 25 MCG (1000 UNIT) tablet Take 1,000 Units by mouth daily.    [provider]  ferrous sulfate 325 (65 FE) MG tablet Take 325 mg by mouth daily.    [provider]  fluticasone (FLONASE) 50 MCG/ACT nasal spray PLACE 2 SPRAYS INTO BOTH NOSTRILS EVERY DAY 02/29/20 02/28/21  Iloabachie, Chioma E, NP  pantoprazole (PROTONIX) 40 MG tablet Take 1 tablet (40 mg total)  by mouth daily. 06/13/20 09/11/20  Iloabachie, Chioma E, NP    Allergies Patient has no known allergies.  Family History  Problem Relation Age of Onset   Diabetes Mother    Hypertension Mother    Cancer Father        leukemia   Alcohol abuse Sister    Breast cancer Neg Hx     Social History Social History   Tobacco Use   Smoking status: Former    Packs/day: 1.00    Years: 38.00    Pack years: 38.00    Types: Cigarettes    Quit date: 04/03/2020    Years since quitting: 0.4   Smokeless tobacco: Never  Vaping Use   Vaping Use: Never used  Substance Use Topics   Alcohol use: Not Currently    Comment: have not been drinking since surgery 04/2019   Drug use: Yes    Frequency: 3.0 times per week    Types: Marijuana    Comment: unsure exactly of use    Review of Systems  Review of Systems  Constitutional:  Negative for chills and fever.  HENT:  Negative for sore throat.   Eyes:  Negative for pain.  Respiratory:  Negative for cough and stridor.   Cardiovascular:  Negative for chest pain.  Gastrointestinal:  Positive for melena and nausea. Negative for abdominal pain and vomiting.  Genitourinary:  Negative for dysuria.  Musculoskeletal:  Negative for myalgias.  Skin:  Negative for rash.  Neurological:  Negative for seizures, loss of consciousness and headaches.  Psychiatric/Behavioral:  Negative for suicidal ideas.   All other systems reviewed and are negative.    ____________________________________________   PHYSICAL EXAM:  VITAL SIGNS: ED Triage Vitals  Enc Vitals Group     BP 09/21/20 1126 (!) 147/111     Pulse Rate 09/21/20 1126 (!) 114     Resp 09/21/20 1126 20     Temp 09/21/20 1126 97.7 F (36.5 C)     Temp Source 09/21/20 1126 Oral     SpO2 09/21/20 1126 96 %     Weight 09/21/20 1127 230 lb (104.3 kg)     Height 09/21/20 1127 5\' 3"  (1.6 m)     Head Circumference --      Peak Flow --      Pain Score 09/21/20 1127 0     Pain Loc --      Pain Edu?  --      Excl. in GC? --    Vitals:   09/21/20 1406 09/21/20 1430  BP: (!) 158/70 (!) 135/101  Pulse: 90 88  Resp: 17 18  Temp:    SpO2: 100% 99%   Physical Exam Vitals and nursing note reviewed.  Constitutional:      General: She is not in acute distress.    Appearance: She is well-developed.  HENT:     Head: Normocephalic and atraumatic.     Right Ear: External ear normal.     Left Ear: External ear normal.     Nose:  Nose normal.  Eyes:     Conjunctiva/sclera: Conjunctivae normal.  Cardiovascular:     Rate and Rhythm: Regular rhythm. Tachycardia present.     Heart sounds: No murmur heard. Pulmonary:     Effort: Pulmonary effort is normal. No respiratory distress.     Breath sounds: Normal breath sounds.  Abdominal:     Palpations: Abdomen is soft.     Tenderness: There is no abdominal tenderness.  Musculoskeletal:     Cervical back: Neck supple.  Skin:    General: Skin is warm and dry.  Neurological:     Mental Status: She is alert and oriented to person, place, and time.  Psychiatric:        Mood and Affect: Mood normal.    On rectal exam there is no active bleeding but there is some black stool Hemoccult positive.  No significant internal hemorrhoids.  No fissures. ____________________________________________   LABS (all labs ordered are listed, but only abnormal results are displayed)  Labs Reviewed  COMPREHENSIVE METABOLIC PANEL - Abnormal; Notable for the following components:      Result Value   Glucose, Bld 133 (*)    BUN 30 (*)    Albumin 3.4 (*)    All other components within normal limits  CBC - Abnormal; Notable for the following components:   WBC 11.5 (*)    RBC 3.86 (*)    Hemoglobin 11.1 (*)    HCT 34.8 (*)    All other components within normal limits  HEMOGLOBIN AND HEMATOCRIT, BLOOD - Abnormal; Notable for the following components:   Hemoglobin 10.4 (*)    HCT 34.5 (*)    All other components within normal limits  MAGNESIUM  POC OCCULT  BLOOD, ED  TYPE AND SCREEN   ____________________________________________  EKG  ____________________________________________  RADIOLOGY  ED MD interpretation: CT abdomen pelvis shows no evidence of dissection, aneurysm, active GI bleeding, diverticulitis or other clear source of bleeding.  There is again seen no significant stenosis of the SMA unchanged from prior with patent IMA and celiac's.  There is also some narrowing of the renal artery.  Hepatic steatosis is also noted.  No other clear acute abdominopelvic process.  Official radiology report(s): CT Angio Abd/Pel W and/or Wo Contrast  Result Date: 09/21/2020 CLINICAL DATA:  Recurrent GI bleeding. EXAM: CTA ABDOMEN AND PELVIS WITHOUT AND WITH CONTRAST TECHNIQUE: Multidetector CT imaging of the abdomen and pelvis was performed using the standard protocol during bolus administration of intravenous contrast. Multiplanar reconstructed images and MIPs were obtained and reviewed to evaluate the vascular anatomy. CONTRAST:  OMNIPAQUE IOHEXOL 350 MG/ML SOLN COMPARISON:  CT abdomen and pelvis-04/04/2020; chest CT-04/17/2020 FINDINGS: VASCULAR Aorta: There is no significant atherosclerotic plaque within a mildly tortuous but normal caliber abdominal aorta. No abdominal aortic dissection or periaortic stranding. Celiac: Widely patent without hemodynamically significant narrowing. SMA: There is a moderate amount of eccentric noncalcified atherosclerotic plaque involving the origin and proximal aspect of the SMA (image 64, series 5 which results in at least 50% luminal narrowing (axial image 64, series 5; coronal image 63, series 9). The distal tributaries of the SMA appear widely patent without discrete intraluminal filling defect to suggest distal embolism. Renals: Solitary bilaterally; there is a moderate amount of eccentric predominantly calcified atherosclerotic plaque involving the undersurface of the proximal aspect of the left renal artery which  approaches 50% luminal narrowing (coronal image 68, series 9, however this is without associated delayed renal enhancement or asymmetric renal atrophy. The  right renal artery is widely patent without hemodynamically significant narrowing. No vessel irregularity to suggest FMD. IMA: Widely patent without a hemodynamically significant narrowing. Inflow: The bilateral common and external iliac arteries are of normal caliber and widely patent without hemodynamically significant narrowing. The bilateral internal iliac arteries are mildly disease though patent and of normal caliber. Proximal Outflow: The bilateral common and imaged portions of the bilateral deep and superficial femoral arteries are of normal caliber and widely patent without a hemodynamically significant stenosis. Veins: The IVC and pelvic venous systems appear widely patent. Review of the MIP images confirms the above findings. _________________________________________________________ NON-VASCULAR Lower chest: Limited visualization of the lower thorax is negative for focal airspace opacity or pleural effusion. Borderline cardiomegaly.  No pericardial effusion. Hepatobiliary: There is mild decreased attenuation hepatic parenchyma suggestive of hepatic steatosis. Potential mild nodularity hepatic contour. No discrete hepatic lesions. Normal appearance of the gallbladder given underdistention. No radiopaque gallstones. No intra or extrahepatic biliary ductal dilatation. No ascites. Pancreas: Normal appearance of the pancreas. Spleen: Normal appearance of the spleen. Adrenals/Urinary Tract: There is symmetric enhancement of the bilateral kidneys. No evidence of nephrolithiasis. No discrete renal lesions. No urinary obstruction or perinephric stranding. Normal appearance of the bilateral adrenal glands. Normal appearance of the urinary bladder given degree of distention. Stomach/Bowel: There are no discrete areas of intraluminal contrast extravasation to  suggest acute GI bleeding. Redemonstrated large hiatal hernia containing near the entirety of the stomach, similar to chest CT performed 04/17/2020 and without definitive evidence of volvulus. Scattered colonic diverticulosis without discrete area of superimposed acute diverticulitis. Normal appearance of the terminal ileum and the retrocecal appendix. There is mild gaseous distension of several loops of proximal jejunum, nonspecific though similar to abdominal CT performed 04/04/2020 invests likely normal for this patient. No discrete areas of wall thickening. No evidence of enteric obstruction. No pneumoperitoneum, pneumatosis or portal venous gas. Lymphatic: No bulky retroperitoneal, mesenteric, pelvic or inguinal lymphadenopathy. Reproductive: Redemonstrated approximately 3.7 x 2.5 cm partially exophytic fibroid arising from the posterior aspect of the uterus (image 75, series 7) as well as an approximately 2.5 x 1.5 cm left-sided potential broad ligament uterine fibroid (image 72, series 7), unchanged. Dystrophic calcifications again noted involving the anterior aspect of the uterus, potentially a degenerating fibroid. No discrete adnexal lesion. Previously questioned endometrial thickening is not definitely seen on the present examination. No free fluid the pelvic cul-de-sac. Other: Tiny mesenteric fat containing periumbilical hernia. Musculoskeletal: No acute or aggressive osseous abnormalities. Moderate to severe facet degenerative change of the lower lumbar spine bilaterally. IMPRESSION: VASCULAR 1. No evidence of abdominal aortic aneurysm or dissection. 2. Mixed calcified and noncalcified atherosclerotic plaque results in approximately 50% luminal narrowing involving the proximal aspect of the SMA however both the celiac and IMA appear widely patent without hemodynamically significant narrowing. Aortic Atherosclerosis (ICD10-I70.0). 3. Calcified atherosclerotic plaque results in approximately 50% luminal  narrowing of the left renal artery however this finding is without associated delayed renal enhancement or asymmetric renal atrophy. NON-VASCULAR 1. No discrete areas of intraluminal contrast extravasation to suggest acute GI bleeding. 2. Scattered colonic diverticulosis without evidence superimposed acute diverticulitis. 3. Large hiatal hernia containing the majority of the stomach, similar to chest CT performed 04/17/2020 and without evidence of volvulus. 4. Suspected hepatic steatosis with mild nodularity hepatic contour as could be seen in the setting of early cirrhotic change. Correlation with LFTs is advised. 5. Redemonstrated fibroid uterus. Electronically Signed   By: Simonne Come M.D.   On: 09/21/2020  13:42    ____________________________________________   PROCEDURES  Procedure(s) performed (including Critical Care):  Procedures   ____________________________________________   INITIAL IMPRESSION / ASSESSMENT AND PLAN / ED COURSE      Patient presents with above-stated history exam for assessment of some melanotic stools that started today as well as her nausea.  On arrival she is tachycardic with heart rate 114 and hypertensive with BP of 147/111 with otherwise stable vital signs on room air.  Her abdomen is soft and nontender throughout and she has no CVA tenderness but she does have some melanotic stools on rectal exam or Hemoccult positive.  Differential includes upper versus lower GI bleed.  She has no history of peptic ulcer and recent endoscopy in March of this year but does have gastritis noted.  In addition on colonoscopy she had some diverticuli noted but no AVMs.  With possible she has bleeding from her gastritis versus having developed an ulcer or possibly bleeding from the duodenum or diverticuli of the thinks is less likely given by Dr. Lenon Ahmadi.  She has no history of alcohol abuse or varices.  CT abdomen pelvis shows no evidence of dissection, aneurysm, active GI  bleeding, diverticulitis or other clear source of bleeding.  There is again seen no significant stenosis of the SMA unchanged from prior with patent IMA and celiac's.  There is also some narrowing of the renal artery.  Hepatic steatosis is also noted.  No other clear acute abdominopelvic process.   CMP remarkable for a BUN of 30 without any other significant electrolyte or metabolic derangements.  CBC shows WBC count of 11.5 and hemoglobin of 11.1 with normal platelets.  Magnesium is within normal limits.  We are hemoglobin shows downtrend to 10.4 after 1 L IV fluids.  I discussed with on-call gastroenterologist Dr. Timothy Lasso who recommended admission with plan to see the patient tomorrow.  Discussed at length with the patient and she initially strongly wished to go home I was able to convince her to stay, and concern given her tachycardia and ongoing melanotic stools while emergency room for high risk GI bleeding.  I will admit to hospital service for further evaluation and management.      ____________________________________________   FINAL CLINICAL IMPRESSION(S) / ED DIAGNOSES  Final diagnoses:  Gastrointestinal hemorrhage, unspecified gastrointestinal hemorrhage type    Medications  pantoprozole (PROTONIX) 80 mg /NS 100 mL infusion (8 mg/hr Intravenous New Bag/Given 09/21/20 1248)  pantoprazole (PROTONIX) injection 40 mg (has no administration in time range)  ondansetron (ZOFRAN) injection 4 mg (4 mg Intravenous Given 09/21/20 1223)  pantoprazole (PROTONIX) 80 mg /NS 100 mL IVPB (0 mg Intravenous Stopped 09/21/20 1246)  sodium chloride 0.9 % bolus 1,000 mL (0 mLs Intravenous Stopped 09/21/20 1401)  iohexol (OMNIPAQUE) 350 MG/ML injection 100 mL (100 mLs Intravenous Contrast Given 09/21/20 1257)     ED Discharge Orders     None        Note:  This document was prepared using Dragon voice recognition software and may include unintentional dictation errors.    Gilles Chiquito,  MD 09/21/20 [phone]

## 2020-09-21 NOTE — Consult Note (Signed)
[facility] Gastroenterology Inpatient Consultation   Patient ID: Kimberly Mcdonald is a 55 y.o. female.  Requesting Provider: Antoine Primas, MD; emergency medicine  Date of Admission: 09/21/2020  Date of Consult: 09/21/20   Reason for Consultation: melena   Patient's Chief Complaint:   Chief Complaint  Patient presents with   Rectal Bleeding    55 year old Caucasian female with history of chronic anemia, hypertension, hyperlipidemia, CVA who presents to the hospital with melena.  Gastroenterology was consulted for melena and concern for upper GI bleeding.  Patient initially came in tachycardic with heart rate in the 110s, but otherwise hemodynamically stable.  She has noted dark bowel movements today but has been off her iron supplementation for 1 month.  She is on daily PPI.  She denies any other NSAID anticoagulant or antiplatelet use.  She denies any belly pain, but did have some nausea without vomiting earlier today.  No hematochezia coffee-ground emesis or hematemesis.  She denies any dysphagia or odynophagia.  Appetite and weight have been stable.  Hemoglobin 11.1 on presentation but decreased to 10.4 after IV fluids.  She has baseline hemoglobin around 11.8.  Currently resting comfortably with no other acute GI complaints.  EGD and colonoscopy as below   Denies NSAIDs, Anti-plt agents, and anticoagulants Denies family history of gastrointestinal disease and malignancy Previous Endoscopies: March 2022; EGD and Colonoscopy -Camerons erosions with HH; Schatski's ring present; negative h pylori; colon with tics and hemorrhoids  Past Medical History:  Diagnosis Date   Allergy    seasonal   Anemia due to blood loss 05/08/2019   Arthritis    knees and back   Cerebrovascular accident (CVA) (HCC)    HTN (hypertension)    Hyperlipidemia    Hypertension    Stroke (HCC)    Substance abuse (HCC)    Marijuana smoker x35 yrs    Past Surgical History:  Procedure Laterality Date    ABDOMINAL SURGERY     COLONOSCOPY WITH PROPOFOL N/A 04/05/2020   Procedure: COLONOSCOPY WITH PROPOFOL;  Surgeon: Toledo, Boykin Nearing, MD;  Location: [facility] ENDOSCOPY;  Service: Gastroenterology;  Laterality: N/A;   ENDARTERECTOMY Left 05/13/2019   Procedure: ENDARTERECTOMY CAROTID;  Surgeon: Renford Dills, MD;  Location: [facility] ORS;  Service: Vascular;  Laterality: Left;   ESOPHAGOGASTRODUODENOSCOPY (EGD) WITH PROPOFOL N/A 04/05/2020   Procedure: ESOPHAGOGASTRODUODENOSCOPY (EGD) WITH PROPOFOL;  Surgeon: Toledo, Boykin Nearing, MD;  Location: [facility] ENDOSCOPY;  Service: Gastroenterology;  Laterality: N/A;   TUBAL LIGATION      No Known Allergies  Family History  Problem Relation Age of Onset   Diabetes Mother    Hypertension Mother    Cancer Father        leukemia   Alcohol abuse Sister    Breast cancer Neg Hx     Social History   Tobacco Use   Smoking status: Former    Packs/day: 1.00    Years: 38.00    Pack years: 38.00    Types: Cigarettes    Quit date: 04/03/2020    Years since quitting: 0.4   Smokeless tobacco: Never  Vaping Use   Vaping Use: Never used  Substance Use Topics   Alcohol use: Not Currently    Comment: have not been drinking since surgery 04/2019   Drug use: Yes    Frequency: 3.0 times per week    Types: Marijuana    Comment: unsure exactly of use     Pertinent GI related history and allergies were reviewed with the patient  Review of Systems  Constitutional:  Negative for activity change, appetite change, fatigue and fever.  HENT:  Negative for trouble swallowing and voice change.   Respiratory:  Negative for shortness of breath and wheezing.   Cardiovascular:  Negative for chest pain and palpitations.  Gastrointestinal:  Positive for blood in stool (melena) and nausea. Negative for abdominal distention, abdominal pain, anal bleeding, constipation, diarrhea, rectal pain and vomiting.  Musculoskeletal:  Negative for arthralgias and myalgias.  Skin:   Negative for color change and pallor.  Neurological:  Negative for dizziness, syncope and weakness.  Psychiatric/Behavioral:  Negative for confusion.   All other systems reviewed and are negative.   Medications Home Medications No current facility-administered medications on file prior to encounter.   Current Outpatient Medications on File Prior to Encounter  Medication Sig Dispense Refill   albuterol (VENTOLIN HFA) 108 (90 Base) MCG/ACT inhaler Inhale 2 puffs into the lungs every 6 (six) hours as needed for wheezing or shortness of breath. 6.7 g 0   amLODipine (NORVASC) 10 MG tablet TAKE ONE TABLET BY MOUTH EVERY DAY 90 tablet 1   atorvastatin (LIPITOR) 40 MG tablet TAKE ONE TABLET BY MOUTH EVERY DAY 90 tablet 0   butalbital-acetaminophen-caffeine (FIORICET) 50-325-40 MG tablet Take 1 tablet by mouth every 6 (six) hours as needed for headache. 20 tablet 0   cholecalciferol (VITAMIN D3) 25 MCG (1000 UNIT) tablet Take 1,000 Units by mouth daily.     ferrous sulfate 325 (65 FE) MG tablet Take 325 mg by mouth daily.     fluticasone (FLONASE) 50 MCG/ACT nasal spray PLACE 2 SPRAYS INTO BOTH NOSTRILS EVERY DAY 16 g 1   pantoprazole (PROTONIX) 40 MG tablet Take 1 tablet (40 mg total) by mouth daily. 90 tablet 0   Pertinent GI related medications were reviewed with the patient  Inpatient Medications  Current Facility-Administered Medications:    acetaminophen (TYLENOL) tablet 650 mg, 650 mg, Oral, Q6H PRN **OR** acetaminophen (TYLENOL) suppository 650 mg, 650 mg, Rectal, Q6H PRN, Cox, Amy N, DO   albuterol (PROVENTIL) (2.5 MG/3ML) 0.083% nebulizer solution 2.5 mg, 2.5 mg, Nebulization, Q6H PRN, Cox, Amy N, DO   atorvastatin (LIPITOR) tablet 40 mg, 40 mg, Oral, QHS, Cox, Amy N, DO   ferrous sulfate tablet 325 mg, 325 mg, Oral, Daily, Cox, Amy N, DO   fluticasone (FLONASE) 50 MCG/ACT nasal spray 2 spray, 2 spray, Each Nare, Daily PRN, Cox, Amy N, DO   ondansetron (ZOFRAN) tablet 4 mg, 4 mg, Oral,  Q6H PRN **OR** ondansetron (ZOFRAN) injection 4 mg, 4 mg, Intravenous, Q6H PRN, Cox, Amy N, DO   [START ON 09/25/2020] pantoprazole (PROTONIX) injection 40 mg, 40 mg, Intravenous, Q12H, Cox, Amy N, DO   pantoprozole (PROTONIX) 80 mg /NS 100 mL infusion, 8 mg/hr, Intravenous, Continuous, Cox, Amy N, DO, Last Rate: 10 mL/hr at 09/21/20 1248, 8 mg/hr at 09/21/20 1248  Current Outpatient Medications:    albuterol (VENTOLIN HFA) 108 (90 Base) MCG/ACT inhaler, Inhale 2 puffs into the lungs every 6 (six) hours as needed for wheezing or shortness of breath., Disp: 6.7 g, Rfl: 0   amLODipine (NORVASC) 10 MG tablet, TAKE ONE TABLET BY MOUTH EVERY DAY, Disp: 90 tablet, Rfl: 1   atorvastatin (LIPITOR) 40 MG tablet, TAKE ONE TABLET BY MOUTH EVERY DAY, Disp: 90 tablet, Rfl: 0   butalbital-acetaminophen-caffeine (FIORICET) 50-325-40 MG tablet, Take 1 tablet by mouth every 6 (six) hours as needed for headache., Disp: 20 tablet, Rfl: 0   cholecalciferol (VITAMIN  D3) 25 MCG (1000 UNIT) tablet, Take 1,000 Units by mouth daily., Disp: , Rfl:    ferrous sulfate 325 (65 FE) MG tablet, Take 325 mg by mouth daily., Disp: , Rfl:    fluticasone (FLONASE) 50 MCG/ACT nasal spray, PLACE 2 SPRAYS INTO BOTH NOSTRILS EVERY DAY, Disp: 16 g, Rfl: 1   pantoprazole (PROTONIX) 40 MG tablet, Take 1 tablet (40 mg total) by mouth daily., Disp: 90 tablet, Rfl: 0  pantoprazole 8 mg/hr (09/21/20 1248)    acetaminophen **OR** acetaminophen, albuterol, fluticasone, ondansetron **OR** ondansetron (ZOFRAN) IV   Objective   Vitals:   09/21/20 1230 09/21/20 1406 09/21/20 1430 09/21/20 1540  BP: (!) 118/94 (!) 158/70 (!) 135/101 (!) 141/105  Pulse: (!) 105 90 88 95  Resp: 13 17 18 18   Temp:      TempSrc:      SpO2: 98% 100% 99% 96%  Weight:      Height:         Physical Exam Vitals reviewed.  Constitutional:      General: She is not in acute distress.    Appearance: Normal appearance. She is obese. She is not ill-appearing,  toxic-appearing or diaphoretic.  HENT:     Head: Normocephalic and atraumatic.     Nose: Nose normal.  Eyes:     General: No scleral icterus.    Extraocular Movements: Extraocular movements intact.  Cardiovascular:     Rate and Rhythm: Normal rate and regular rhythm.     Heart sounds: Normal heart sounds. No murmur heard.   No friction rub. No gallop.  Pulmonary:     Effort: Pulmonary effort is normal. No respiratory distress.     Breath sounds: Normal breath sounds. No wheezing, rhonchi or rales.  Abdominal:     General: Bowel sounds are normal. There is no distension.     Palpations: Abdomen is soft.     Tenderness: There is no abdominal tenderness. There is no guarding or rebound.  Musculoskeletal:     Cervical back: Neck supple.     Right lower leg: No edema.     Left lower leg: No edema.  Skin:    General: Skin is warm and dry.     Coloration: Skin is not jaundiced or pale.  Neurological:     General: No focal deficit present.     Mental Status: She is alert and oriented to person, place, and time. Mental status is at baseline.  Psychiatric:        Mood and Affect: Mood normal.        Behavior: Behavior normal.        Thought Content: Thought content normal.        Judgment: Judgment normal.    Laboratory Data Recent Labs  Lab 09/21/20 1130 09/21/20 1503  WBC 11.5*  --   HGB 11.1* 10.4*  HCT 34.8* 34.5*  PLT 368  --    Recent Labs  Lab 09/21/20 1130  NA 139  K 4.5  CL 110  CO2 23  BUN 30*  CALCIUM 9.1  PROT 7.2  BILITOT 1.0  ALKPHOS 88  ALT 10  AST 16  GLUCOSE 133*       Imaging studies: CT Angio Abd/Pel W and/or Wo Contrast  Result Date: 09/21/2020 CLINICAL DATA:  Recurrent GI bleeding. EXAM: CTA ABDOMEN AND PELVIS WITHOUT AND WITH CONTRAST TECHNIQUE: Multidetector CT imaging of the abdomen and pelvis was performed using the standard protocol during bolus administration of intravenous contrast. Multiplanar  reconstructed images and MIPs were  obtained and reviewed to evaluate the vascular anatomy. CONTRAST:  OMNIPAQUE IOHEXOL 350 MG/ML SOLN COMPARISON:  CT abdomen and pelvis-04/04/2020; chest CT-04/17/2020 FINDINGS: VASCULAR Aorta: There is no significant atherosclerotic plaque within a mildly tortuous but normal caliber abdominal aorta. No abdominal aortic dissection or periaortic stranding. Celiac: Widely patent without hemodynamically significant narrowing. SMA: There is a moderate amount of eccentric noncalcified atherosclerotic plaque involving the origin and proximal aspect of the SMA (image 64, series 5 which results in at least 50% luminal narrowing (axial image 64, series 5; coronal image 63, series 9). The distal tributaries of the SMA appear widely patent without discrete intraluminal filling defect to suggest distal embolism. Renals: Solitary bilaterally; there is a moderate amount of eccentric predominantly calcified atherosclerotic plaque involving the undersurface of the proximal aspect of the left renal artery which approaches 50% luminal narrowing (coronal image 68, series 9, however this is without associated delayed renal enhancement or asymmetric renal atrophy. The right renal artery is widely patent without hemodynamically significant narrowing. No vessel irregularity to suggest FMD. IMA: Widely patent without a hemodynamically significant narrowing. Inflow: The bilateral common and external iliac arteries are of normal caliber and widely patent without hemodynamically significant narrowing. The bilateral internal iliac arteries are mildly disease though patent and of normal caliber. Proximal Outflow: The bilateral common and imaged portions of the bilateral deep and superficial femoral arteries are of normal caliber and widely patent without a hemodynamically significant stenosis. Veins: The IVC and pelvic venous systems appear widely patent. Review of the MIP images confirms the above findings.  _________________________________________________________ NON-VASCULAR Lower chest: Limited visualization of the lower thorax is negative for focal airspace opacity or pleural effusion. Borderline cardiomegaly.  No pericardial effusion. Hepatobiliary: There is mild decreased attenuation hepatic parenchyma suggestive of hepatic steatosis. Potential mild nodularity hepatic contour. No discrete hepatic lesions. Normal appearance of the gallbladder given underdistention. No radiopaque gallstones. No intra or extrahepatic biliary ductal dilatation. No ascites. Pancreas: Normal appearance of the pancreas. Spleen: Normal appearance of the spleen. Adrenals/Urinary Tract: There is symmetric enhancement of the bilateral kidneys. No evidence of nephrolithiasis. No discrete renal lesions. No urinary obstruction or perinephric stranding. Normal appearance of the bilateral adrenal glands. Normal appearance of the urinary bladder given degree of distention. Stomach/Bowel: There are no discrete areas of intraluminal contrast extravasation to suggest acute GI bleeding. Redemonstrated large hiatal hernia containing near the entirety of the stomach, similar to chest CT performed 04/17/2020 and without definitive evidence of volvulus. Scattered colonic diverticulosis without discrete area of superimposed acute diverticulitis. Normal appearance of the terminal ileum and the retrocecal appendix. There is mild gaseous distension of several loops of proximal jejunum, nonspecific though similar to abdominal CT performed 04/04/2020 invests likely normal for this patient. No discrete areas of wall thickening. No evidence of enteric obstruction. No pneumoperitoneum, pneumatosis or portal venous gas. Lymphatic: No bulky retroperitoneal, mesenteric, pelvic or inguinal lymphadenopathy. Reproductive: Redemonstrated approximately 3.7 x 2.5 cm partially exophytic fibroid arising from the posterior aspect of the uterus (image 75, series 7) as well as  an approximately 2.5 x 1.5 cm left-sided potential broad ligament uterine fibroid (image 72, series 7), unchanged. Dystrophic calcifications again noted involving the anterior aspect of the uterus, potentially a degenerating fibroid. No discrete adnexal lesion. Previously questioned endometrial thickening is not definitely seen on the present examination. No free fluid the pelvic cul-de-sac. Other: Tiny mesenteric fat containing periumbilical hernia. Musculoskeletal: No acute or aggressive osseous abnormalities. Moderate to  severe facet degenerative change of the lower lumbar spine bilaterally. IMPRESSION: VASCULAR 1. No evidence of abdominal aortic aneurysm or dissection. 2. Mixed calcified and noncalcified atherosclerotic plaque results in approximately 50% luminal narrowing involving the proximal aspect of the SMA however both the celiac and IMA appear widely patent without hemodynamically significant narrowing. Aortic Atherosclerosis (ICD10-I70.0). 3. Calcified atherosclerotic plaque results in approximately 50% luminal narrowing of the left renal artery however this finding is without associated delayed renal enhancement or asymmetric renal atrophy. NON-VASCULAR 1. No discrete areas of intraluminal contrast extravasation to suggest acute GI bleeding. 2. Scattered colonic diverticulosis without evidence superimposed acute diverticulitis. 3. Large hiatal hernia containing the majority of the stomach, similar to chest CT performed 04/17/2020 and without evidence of volvulus. 4. Suspected hepatic steatosis with mild nodularity hepatic contour as could be seen in the setting of early cirrhotic change. Correlation with LFTs is advised. 5. Redemonstrated fibroid uterus. Electronically Signed   By: Simonne ComeJohn  Watts M.D.   On: 09/21/2020 13:42    Assessment:   #Suspected upper GI bleeding  -History of Cameron's erosions  -Tachycardic on presentation.  BUN 30 / creatinine 0.52  - melena # obesity # HTN  # Nodular  contour to liver on imaging  - outpatient w/u for liver disease. Does not have history  - no signs of portal htn on last egd 03/2020  - lfts wnl; plt nml  # h/o cva  - no anti plt therapy  Plan:  Pending endoscopy unit availability we will plan for EGD with possible intervention tomorrow Continue Protonix 40 mg IV twice daily Monitor H&H with transfusion and resuscitation as per primary team N.p.o. at midnight.  CBC BMP and INR in the morning Okay for clears up until midnight Monitor for signs of further GI bleeding Would hold iron supplementation until after procedure Avoid NSAIDs and DVT prophylaxis Outpatient work-up for potential liver disease Supportive care as per primary team  Case discussed with hospitalist team  Esophagogastroduodenoscopy with possible biopsy, control of bleeding, polypectomy, and interventions as necessary has been discussed with the patient/patient representative. Informed consent was obtained from the patient/patient representative after explaining the indication, nature, and risks of the procedure including but not limited to death, bleeding, perforation, missed neoplasm/lesions, cardiorespiratory compromise, and reaction to medications. Opportunity for questions was given and appropriate answers were provided. Patient/patient representative has verbalized understanding is amenable to undergoing the procedure.  I personally performed the service.  Management of other medical comorbidities as per primary team  Thank you for allowing us to participate in this patient's care. Please don't hesitate to call if any questions or concerns arise.   Jaynie CollinsSteven Michael Jannae Fagerstrom, DO [facility]  Portions of the record may have been created with voice recognition software. Occasional wrong-word or 'sound-a-like' substitutions may have occurred due to the inherent limitations of voice recognition software.  Read the chart carefully and recognize, using context,  where substitutions may have occurred.

## 2020-09-21 NOTE — ED Notes (Signed)
Pt ambulatory back to ED treatment room, steady gait. Continuous cardiac monitoring resumed. Stretcher locked in low position, call light personal belongings in reach.

## 2020-09-21 NOTE — ED Notes (Signed)
Pt ambulatory to restroom with steady gait.

## 2020-09-21 NOTE — H&P (Addendum)
History and Physical   Kimberly Mcdonald YDX:[id number] DOB: 23-Jun-1965 DOA: 09/21/2020  PCP: System, Provider Not In  Outpatient Specialists: Dr. Norma Fredrickson  Patient coming from: home from EMS  I have personally briefly reviewed patient's old medical records in [facility] EMR.  Chief Concern: Melena  HPI: Kimberly Mcdonald is a 55 y.o. female with medical history significant for obesity, hepatic steatosis, uterine fibroid, history of GI bleed, osteoarthritis of bilateral knee, presents emergency department for chief concerns of melena stool.  At bedside, she is able to tell me her name, age of 55, current calendar year, and current location.   She saw black, sticky stool, it had an abnormal smell, not like a bowel movement. She reports the episode was at 3 AM today and she has had at least 4 bowels at  home and one bowel movements here in the ED.   She endorses frequent urination compared to baseline as she has a prolapse bladder. She denies dysuria. She states she has not had anything today. She last ate two pieces of ice and 1 sip of Pepsi prior to ED presentation. Her last meal was on evening of 9/8 before bed time.   She denies chest pain, shortness of breath, syncope.   She denies BC powder, goody powder. She denies nsaid use including ibuprofen, motrin, naproxen. Several years ago, she took ibuprofen like candy and she had endoscopy and colonoscopy and was told she had bleeding hernia and advised to stop nsaid use. She states she has not had a single ibuprofen since.   She reports she has not taken her iron tablets for about one month. She states the stool while taking iron tablets is different than the stool today.   Social history: She lives with her daughter. She endorses etoh use, she drinks once per week, and her last drink was Wood, two days ago. She endorses tobacco use, smokes cigarettes 1 cigarette - 3 cigarettes per day. She smokes THC everyday. She denies other  recreational drug use. She is disabled and formerly worked in [employer].   Vaccination history: She is vaccinated for covid19 with two doses of Moderna  ROS: Constitutional: no weight change, no fever ENT/Mouth: no sore throat, no rhinorrhea Eyes: no eye pain, no vision changes Cardiovascular: no chest pain, no dyspnea,  no edema, no palpitations Respiratory: no cough, no sputum, no wheezing Gastrointestinal: no nausea, no vomiting, no diarrhea, no constipation, + melena  Genitourinary: no urinary incontinence, no dysuria, no hematuria Musculoskeletal: no arthralgias, no myalgias Skin: no skin lesions, no pruritus, Neuro: + weakness, no loss of consciousness, no syncope Psych: no anxiety, no depression, + decrease appetite Heme/Lymph: no bruising, no bleeding  ED Course: Discussed with ED provider, patient requiring hospitalization for chief concern of suspected GI bleed.   Vitals in the emergency department is remarkable for temperature of 97.7, respiration rate of 20, respiration rate of 125, initial blood pressure 135/110, SPO2 of 98% on room air.  Labs in the emergency department was remarkable for sodium 139, potassium 4.5, chloride 110, bicarb 23, BUN 30, serum creatinine of 0.52, nonfasting blood glucose 133, WBC 11.5, hemoglobin 11.1, decreased to 10.4, platelets is 368.  Assessment/Plan  Principal Problem:   Acute upper GI bleeding Active Problems:   Hypertension   Symptomatic anemia   Tobacco abuse   Cerebrovascular accident (CVA) (HCC)   Obesity, Class III, BMI 40-49.9 (morbid obesity) (HCC)   Heart rate fast   Melena   Hyperlipidemia   Hepatic  steatosis   Leukocytosis   # Dark melena stool-suspect secondary to acute upper GI bleed - Protonix gtt. with bolus - CBC in the a.m. - GI has been consulted, Dr. Timothy Lasso will see the patient - Clear liquids at this time, n.p.o. effective midnight - Possible endoscopy in the a.m.  # Hyperlipidemia-atorvastatin 40 mg  nightly resumed  # Leukocytosis-I suspect this is reactive in setting of GI loss  # GERD-PPI as above  # History of headaches-Home medications Fioricet 1 tablet, p.o., every 6 hours as needed for headache resumed  # Hepatic steatosis-with CT read of early cirrhosis - I suspect this is secondary to patient's BMI of 40.7 - Patient would benefit from weight loss # Obese abdomen-recommend outpatient follow-up for PCP guidance on healthy diet and exercise  Chart reviewed.   DVT prophylaxis: ted house  Code Status: full code Diet: clear diet now, npo after midnight Family Communication: no  Disposition Plan: pending clinical course  Consults called: Gastroenterology, Dr. 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 yrs   Past Surgical History:  Procedure Laterality Date   ABDOMINAL SURGERY     COLONOSCOPY WITH PROPOFOL N/A 04/05/2020   Procedure: COLONOSCOPY WITH PROPOFOL;  Surgeon: Toledo, Boykin Nearing, MD;  Location: [facility] ENDOSCOPY;  Service: Gastroenterology;  Laterality: N/A;   ENDARTERECTOMY Left 05/13/2019   Procedure: ENDARTERECTOMY CAROTID;  Surgeon: Renford Dills, MD;  Location: [facility] ORS;  Service: Vascular;  Laterality: Left;   ESOPHAGOGASTRODUODENOSCOPY (EGD) WITH PROPOFOL N/A 04/05/2020   Procedure: ESOPHAGOGASTRODUODENOSCOPY (EGD) WITH PROPOFOL;  Surgeon: Toledo, Boykin Nearing, MD;  Location: [facility] ENDOSCOPY;  Service: Gastroenterology;  Laterality: N/A;   TUBAL LIGATION     Social History:  reports that she quit smoking about 5 months ago. Her smoking use included cigarettes. She has a 38.00 pack-year smoking history. She has never used smokeless tobacco. She reports  that she does not currently use alcohol. She reports current drug use. Frequency: 3.00 times per week. Drug: Marijuana.  No Known Allergies Family History  Problem Relation Age of Onset   Diabetes Mother    Hypertension Mother    Cancer Father        leukemia   Alcohol abuse Sister    Breast cancer Neg Hx    Family history: Family history reviewed and not pertinent  Prior to Admission medications   Medication Sig Start Date End Date Taking? Authorizing Provider  albuterol (VENTOLIN HFA) 108 (90 Base) MCG/ACT inhaler Inhale 2 puffs into the lungs every 6 (six) hours as needed for wheezing or shortness of breath. 04/26/20   Iloabachie, Chioma E, NP  amLODipine (NORVASC) 10 MG tablet TAKE ONE TABLET BY MOUTH EVERY DAY 02/29/20 02/28/21  Iloabachie, Chioma E, NP  atorvastatin (LIPITOR) 40 MG tablet TAKE ONE TABLET BY MOUTH EVERY DAY 06/13/20 05/28/21  Iloabachie, Chioma E, NP  butalbital-acetaminophen-caffeine (FIORICET) 50-325-40 MG tablet Take 1 tablet by mouth every 6 (six) hours as needed for headache. 04/08/20 04/08/21  Lucy Chris, PA  cholecalciferol (VITAMIN D3) 25 MCG (1000 UNIT) tablet Take 1,000 Units by mouth daily.    [provider]  ferrous sulfate 325 (65 FE) MG tablet Take 325 mg by mouth daily.    [provider]  fluticasone (FLONASE) 50 MCG/ACT nasal spray PLACE 2 SPRAYS INTO BOTH NOSTRILS EVERY DAY 02/29/20 02/28/21  Iloabachie, Chioma E, NP  pantoprazole (PROTONIX) 40 MG tablet Take 1 tablet (40 mg total) by mouth daily. 06/13/20 09/11/20  Rolm Gala, NP   Physical Exam: Vitals:   09/21/20 1540 09/21/20 1700 09/21/20 1731 09/21/20 2018  BP: (!) 141/105 (!) 150/92 (!) 105/92 (!) 155/86  Pulse: 95 93 99 97  Resp: 18  17 19   Temp:   97.8 F (36.6 C) 98.3 F (36.8 C)  TempSrc:      SpO2: 96% 95% 98% 95%  Weight:      Height:       Constitutional: appears older than chronological age, NAD, calm, comfortable Eyes: PERRL, lids and conjunctivae  normal ENMT: Mucous membranes are moist. Posterior pharynx clear of any exudate or lesions. Age-appropriate dentition. Hearing appropriate Neck: normal, supple, no masses, no thyromegaly Respiratory: clear to auscultation bilaterally, no wheezing, no crackles. Normal respiratory effort. No accessory muscle use.  Cardiovascular: Regular rate and rhythm, no murmurs / rubs / gallops. No extremity edema. 2+ pedal pulses. No carotid bruits.  Abdomen: Obese abdomen, no tenderness, no masses palpated, no hepatosplenomegaly. Bowel sounds positive.  Musculoskeletal: no clubbing / cyanosis. No joint deformity upper and lower extremities. Good ROM, no contractures, no atrophy. Normal muscle tone.  Skin: no rashes, lesions, ulcers. No induration Neurologic: Sensation intact. Strength 5/5 in all 4.  Psychiatric: Normal judgment and insight. Alert and oriented x 3. Normal mood.   EKG: independently reviewed, showing sinus rhythm that is irregular, rate of 91, QTc 432  Chest x-ray on Admission: I personally reviewed and I agree with radiologist reading as below.  CT Angio Abd/Pel W and/or Wo Contrast  Result Date: 09/21/2020 CLINICAL DATA:  Recurrent GI bleeding. EXAM: CTA ABDOMEN AND PELVIS WITHOUT AND WITH CONTRAST TECHNIQUE: Multidetector CT imaging of the abdomen and pelvis was performed using the standard protocol during bolus administration of intravenous contrast. Multiplanar reconstructed images and MIPs were obtained and reviewed to evaluate the vascular anatomy. CONTRAST:  11/21/2020 OMNIPAQUE IOHEXOL 350 MG/ML SOLN COMPARISON:  CT abdomen and pelvis-04/04/2020; chest CT-04/17/2020 FINDINGS: VASCULAR Aorta: There is no significant atherosclerotic plaque within a mildly tortuous but normal caliber abdominal aorta. No abdominal aortic dissection or periaortic stranding. Celiac: Widely patent without hemodynamically significant narrowing. SMA: There is a moderate amount of eccentric noncalcified atherosclerotic  plaque involving the origin and proximal aspect of the SMA (image 64, series 5 which results in at least 50% luminal narrowing (axial image 64, series 5; coronal image 63, series 9). The distal tributaries of the SMA appear widely patent without discrete intraluminal filling defect to suggest distal embolism. Renals: Solitary bilaterally; there is a moderate amount of eccentric predominantly calcified atherosclerotic plaque involving the undersurface of the proximal aspect of the left renal artery which approaches 50% luminal narrowing (coronal image 68, series 9, however this is without associated delayed renal enhancement or asymmetric renal atrophy. The right renal artery is widely patent without hemodynamically significant narrowing. No vessel irregularity to suggest FMD. IMA: Widely patent without a hemodynamically significant narrowing. Inflow: The bilateral common and external iliac arteries are of normal caliber and widely patent without hemodynamically significant narrowing. The bilateral internal iliac arteries are mildly disease though patent and of normal caliber. Proximal Outflow: The bilateral common and imaged portions of the bilateral  deep and superficial femoral arteries are of normal caliber and widely patent without a hemodynamically significant stenosis. Veins: The IVC and pelvic venous systems appear widely patent. Review of the MIP images confirms the above findings. _________________________________________________________ NON-VASCULAR Lower chest: Limited visualization of the lower thorax is negative for focal airspace opacity or pleural effusion. Borderline cardiomegaly.  No pericardial effusion. Hepatobiliary: There is mild decreased attenuation hepatic parenchyma suggestive of hepatic steatosis. Potential mild nodularity hepatic contour. No discrete hepatic lesions. Normal appearance of the gallbladder given underdistention. No radiopaque gallstones. No intra or extrahepatic biliary ductal  dilatation. No ascites. Pancreas: Normal appearance of the pancreas. Spleen: Normal appearance of the spleen. Adrenals/Urinary Tract: There is symmetric enhancement of the bilateral kidneys. No evidence of nephrolithiasis. No discrete renal lesions. No urinary obstruction or perinephric stranding. Normal appearance of the bilateral adrenal glands. Normal appearance of the urinary bladder given degree of distention. Stomach/Bowel: There are no discrete areas of intraluminal contrast extravasation to suggest acute GI bleeding. Redemonstrated large hiatal hernia containing near the entirety of the stomach, similar to chest CT performed 04/17/2020 and without definitive evidence of volvulus. Scattered colonic diverticulosis without discrete area of superimposed acute diverticulitis. Normal appearance of the terminal ileum and the retrocecal appendix. There is mild gaseous distension of several loops of proximal jejunum, nonspecific though similar to abdominal CT performed 04/04/2020 invests likely normal for this patient. No discrete areas of wall thickening. No evidence of enteric obstruction. No pneumoperitoneum, pneumatosis or portal venous gas. Lymphatic: No bulky retroperitoneal, mesenteric, pelvic or inguinal lymphadenopathy. Reproductive: Redemonstrated approximately 3.7 x 2.5 cm partially exophytic fibroid arising from the posterior aspect of the uterus (image 75, series 7) as well as an approximately 2.5 x 1.5 cm left-sided potential broad ligament uterine fibroid (image 72, series 7), unchanged. Dystrophic calcifications again noted involving the anterior aspect of the uterus, potentially a degenerating fibroid. No discrete adnexal lesion. Previously questioned endometrial thickening is not definitely seen on the present examination. No free fluid the pelvic cul-de-sac. Other: Tiny mesenteric fat containing periumbilical hernia. Musculoskeletal: No acute or aggressive osseous abnormalities. Moderate to severe  facet degenerative change of the lower lumbar spine bilaterally. IMPRESSION: VASCULAR 1. No evidence of abdominal aortic aneurysm or dissection. 2. Mixed calcified and noncalcified atherosclerotic plaque results in approximately 50% luminal narrowing involving the proximal aspect of the SMA however both the celiac and IMA appear widely patent without hemodynamically significant narrowing. Aortic Atherosclerosis (ICD10-I70.0). 3. Calcified atherosclerotic plaque results in approximately 50% luminal narrowing of the left renal artery however this finding is without associated delayed renal enhancement or asymmetric renal atrophy. NON-VASCULAR 1. No discrete areas of intraluminal contrast extravasation to suggest acute GI bleeding. 2. Scattered colonic diverticulosis without evidence superimposed acute diverticulitis. 3. Large hiatal hernia containing the majority of the stomach, similar to chest CT performed 04/17/2020 and without evidence of volvulus. 4. Suspected hepatic steatosis with mild nodularity hepatic contour as could be seen in the setting of early cirrhotic change. Correlation with LFTs is advised. 5. Redemonstrated fibroid uterus. Electronically Signed   By: Simonne Come M.D.   On: 09/21/2020 13:42    Labs on Admission: I have personally reviewed following labs  CBC: Recent Labs  Lab 09/21/20 1130 09/21/20 1503  WBC 11.5*  --   HGB 11.1* 10.4*  HCT 34.8* 34.5*  MCV 90.2  --   PLT 368  --    Basic Metabolic Panel: Recent Labs  Lab 09/21/20 1130  NA 139  K 4.5  CL  110  CO2 23  GLUCOSE 133*  BUN 30*  CREATININE 0.52  CALCIUM 9.1  MG 1.9   GFR: Estimated Creatinine Clearance: 90.7 mL/min (by C-G formula based on SCr of 0.52 mg/dL).  Liver Function Tests: Recent Labs  Lab 09/21/20 1130  AST 16  ALT 10  ALKPHOS 88  BILITOT 1.0  PROT 7.2  ALBUMIN 3.4*   Urine analysis:    Component Value Date/Time   COLORURINE YELLOW (A) 04/04/2020 1459   APPEARANCEUR HAZY (A)  04/04/2020 1459   LABSPEC 1.023 04/04/2020 1459   PHURINE 6.0 04/04/2020 1459   GLUCOSEU NEGATIVE 04/04/2020 1459   HGBUR NEGATIVE 04/04/2020 1459   BILIRUBINUR NEGATIVE 04/04/2020 1459   KETONESUR NEGATIVE 04/04/2020 1459   PROTEINUR NEGATIVE 04/04/2020 1459   NITRITE NEGATIVE 04/04/2020 1459   LEUKOCYTESUR NEGATIVE 04/04/2020 1459   Dr. Sedalia Mutaox [facility]  If 7PM-7AM, please contact overnight-coverage provider If 7AM-7PM, please contact day coverage provider [email or web address]  09/21/2020, 9:36 PM

## 2020-09-21 NOTE — ED Notes (Signed)
Unsuccessful IV attempt x2 in left antecubital, no bleeding from either site - gauze bandages applied. 2nd RN at bedside to attempt IV access.

## 2020-09-21 NOTE — ED Notes (Signed)
Chaperoned ED provider for rectal exam. 

## 2020-09-22 ENCOUNTER — Encounter
Admission: EM | Disposition: A | Discharge status: Home / Self Care | Payer: Self-pay | Source: Home / Self Care | Attending: Emergency Medicine

## 2020-09-22 ENCOUNTER — Observation Stay: Payer: 59 | Admitting: Certified Registered"

## 2020-09-22 DIAGNOSIS — K922 Gastrointestinal hemorrhage, unspecified: Secondary | ICD-10-CM | POA: Diagnosis not present

## 2020-09-22 DIAGNOSIS — I10 Essential (primary) hypertension: Secondary | ICD-10-CM | POA: Diagnosis not present

## 2020-09-22 DIAGNOSIS — K760 Fatty (change of) liver, not elsewhere classified: Secondary | ICD-10-CM

## 2020-09-22 HISTORY — PX: ESOPHAGOGASTRODUODENOSCOPY (EGD) WITH PROPOFOL: SHX5813

## 2020-09-22 LAB — CBC
HCT: 28.3 % — ABNORMAL LOW (ref 36.0–46.0)
Hemoglobin: 9 g/dL — ABNORMAL LOW (ref 12.0–15.0)
MCH: 28.9 pg (ref 26.0–34.0)
MCHC: 31.8 g/dL (ref 30.0–36.0)
MCV: 91 fL (ref 80.0–100.0)
Platelets: 289 10*3/uL (ref 150–400)
RBC: 3.11 MIL/uL — ABNORMAL LOW (ref 3.87–5.11)
RDW: 15.1 % (ref 11.5–15.5)
WBC: 8.6 10*3/uL (ref 4.0–10.5)
nRBC: 0 % (ref 0.0–0.2)

## 2020-09-22 LAB — BASIC METABOLIC PANEL
Anion gap: 4 — ABNORMAL LOW (ref 5–15)
BUN: 16 mg/dL (ref 6–20)
CO2: 23 mmol/L (ref 22–32)
Calcium: 8.4 mg/dL — ABNORMAL LOW (ref 8.9–10.3)
Chloride: 114 mmol/L — ABNORMAL HIGH (ref 98–111)
Creatinine, Ser: 0.53 mg/dL (ref 0.44–1.00)
GFR, Estimated: >60 mL/min (ref >60)
Glucose, Bld: 101 mg/dL — ABNORMAL HIGH (ref 70–99)
Potassium: 3.5 mmol/L (ref 3.5–5.1)
Sodium: 141 mmol/L (ref 135–145)

## 2020-09-22 LAB — HIV ANTIBODY (ROUTINE TESTING W REFLEX): HIV Screen 4th Generation wRfx: NONREACTIVE

## 2020-09-22 LAB — PROTIME-INR
INR: 1 (ref 0.8–1.2)
Prothrombin Time: 13.6 seconds (ref 11.4–15.2)

## 2020-09-22 SURGERY — ESOPHAGOGASTRODUODENOSCOPY (EGD) WITH PROPOFOL
Anesthesia: General

## 2020-09-22 MED ORDER — PROPOFOL 500 MG/50ML IV EMUL
INTRAVENOUS | Status: AC
  Filled 2020-09-22: qty 50

## 2020-09-22 MED ORDER — FENTANYL CITRATE (PF) 100 MCG/2ML IJ SOLN
INTRAMUSCULAR | Status: DC | PRN
  Administered 2020-09-22: 50 ug via INTRAVENOUS

## 2020-09-22 MED ORDER — PANTOPRAZOLE SODIUM 40 MG PO TBEC
40.0000 mg | DELAYED_RELEASE_TABLET | Freq: Every day | ORAL | 0 refills | Status: DC
  Filled 2020-09-22: qty 30, 30d supply, fill #0

## 2020-09-22 MED ORDER — PROPOFOL 10 MG/ML IV BOLUS
INTRAVENOUS | Status: DC | PRN
  Administered 2020-09-22: 50 mg via INTRAVENOUS

## 2020-09-22 MED ORDER — SODIUM CHLORIDE 0.9 % IV SOLN: INTRAVENOUS | Status: DC | PRN

## 2020-09-22 MED ORDER — FENTANYL CITRATE (PF) 100 MCG/2ML IJ SOLN
INTRAMUSCULAR | Status: AC
  Filled 2020-09-22: qty 2

## 2020-09-22 MED ORDER — LIDOCAINE HCL (PF) 2 % IJ SOLN
INTRAMUSCULAR | Status: AC
  Filled 2020-09-22: qty 5

## 2020-09-22 MED ORDER — GLYCOPYRROLATE 0.2 MG/ML IJ SOLN
INTRAMUSCULAR | Status: AC
  Filled 2020-09-22: qty 1

## 2020-09-22 MED ORDER — SODIUM CHLORIDE 0.9 % IV SOLN: INTRAVENOUS | Status: DC

## 2020-09-22 MED ORDER — PROPOFOL 500 MG/50ML IV EMUL
INTRAVENOUS | Status: DC | PRN
  Administered 2020-09-22: 100 ug/kg/min via INTRAVENOUS

## 2020-09-22 MED ORDER — LIDOCAINE HCL (CARDIAC) PF 100 MG/5ML IV SOSY
PREFILLED_SYRINGE | INTRAVENOUS | Status: DC | PRN
  Administered 2020-09-22: 50 mg via INTRAVENOUS

## 2020-09-22 MED ORDER — PROPOFOL 10 MG/ML IV BOLUS
INTRAVENOUS | Status: AC
  Filled 2020-09-22: qty 20

## 2020-09-22 NOTE — Interval H&P Note (Signed)
History and Physical Interval Note: Progress Note from 09/22/20  was reviewed and there was no interval change after seeing and examining the patient.  Written consent was obtained from the patient after discussion of risks, benefits, and alternatives. Patient has consented to proceed with Esophagogastroduodenoscopy with possible intervention   09/22/2020 11:07 AM  Kimberly Mcdonald  has presented today for surgery, with the diagnosis of upper gi bleed.  The various methods of treatment have been discussed with the patient and family. After consideration of risks, benefits and other options for treatment, the patient has consented to  Procedure(s): ESOPHAGOGASTRODUODENOSCOPY (EGD) WITH PROPOFOL (N/A) as a surgical intervention.  The patient's history has been reviewed, patient examined, no change in status, stable for surgery.  I have reviewed the patient's chart and labs.  Questions were answered to the patient's satisfaction.     Jaynie Solecki

## 2020-09-22 NOTE — Anesthesia Preprocedure Evaluation (Addendum)
Anesthesia Evaluation  Patient identified by MRN, date of birth, ID band Patient awake    Reviewed: Allergy & Precautions, H&P , NPO status , reviewed documented beta blocker date and time   Airway Mallampati: III  TM Distance: >3 FB Neck ROM: full    Dental  (+) Caps, Poor Dentition   Pulmonary Current Smoker and Patient abstained from smoking., former smoker,    Pulmonary exam normal        Cardiovascular Exercise Tolerance: Poor hypertension, Normal cardiovascular exam  3/22: ECHO 1. Left ventricular ejection fraction, by estimation, is 60 to 65%. The  left ventricle has normal function. The left ventricle has no regional  wall motion abnormalities. Left ventricular diastolic parameters are  consistent with Grade I diastolic  dysfunction (impaired relaxation).  2. Right ventricular systolic function is normal. The right ventricular  size is normal.  3. No valve vegetation noted.   Neuro/Psych CVA (R arm numbness), Residual Symptoms    GI/Hepatic GERD  Medicated,Hepatic steatosis Acute upper GI bleeding  History of Cameron's erosions   Endo/Other  Morbid obesity  Renal/GU      Musculoskeletal  (+) Arthritis ,   Abdominal (+) + obese,   Peds  Hematology  (+) Blood dyscrasia, anemia ,   Anesthesia Other Findings Past Medical History: No date: Allergy     Comment:  seasonal 05/08/2019: Anemia due to blood loss No date: Arthritis     Comment:  knees and back No date: Cerebrovascular accident (CVA) (HCC) No date: HTN (hypertension) No date: Hyperlipidemia No date: Hypertension No date: Substance abuse (HCC)     Comment:  Marijuana smoker 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 yrs  Past Surgical History: 05/13/2019: ENDARTERECTOMY; Left     Comment:  Procedure: ENDARTERECTOMY CAROTID;  Surgeon: Renford Dills, MD;  Location: [facility] ORS;  Service: Vascular;                Laterality: Left; No date: TUBAL LIGATION  BMI     Body Mass Index: 41.01 kg/m      Reproductive/Obstetrics                          Anesthesia Physical  Anesthesia Plan  ASA: III  Anesthesia Plan: General   Post-op Pain Management:    Induction: Intravenous  PONV Risk Score and Plan: Treatment may vary due to age or medical condition and TIVA  Airway Management Planned: Nasal Cannula and Natural Airway  Additional Equipment:   Intra-op Plan:   Post-operative Plan:   Informed Consent: I have reviewed the patients History and Physical, chart, labs and discussed the procedure including the risks, benefits and alternatives for the proposed anesthesia with the patient or authorized representative who has indicated his/her understanding and acceptance.     Dental Advisory Given and Dental advisory given  Plan Discussed with: CRNA and Anesthesiologist  Anesthesia Plan Comments:         Anesthesia Quick Evaluation

## 2020-09-22 NOTE — Progress Notes (Signed)
Pt seen after procedure. Updated on findings of cameron's erosions. Has had recent colonoscopy. Verbalized understanding. Continue twice daily ppi by mouth. Avoid nsaids, tobacco products. Can follow up in office with Dr. Norma Fredrickson (seen in March) or myself. No further GI intervention planned at this time.  GI to sign off. Available as needed. Please do not hesitate to call regarding questions or concerns.  Jaynie Mathies, DO [facility]- Gastroenterology

## 2020-09-22 NOTE — Progress Notes (Signed)
Patient ID: Kimberly RoughAmanda Mcdonald Westervelt, female   DOB: Oct 05, 1965, 55 y.o.   MRN: [id number]  PROGRESS NOTE    Kimberly Mcdonald Goya  NWG:[id number] DOB: Oct 05, 1965 DOA: 09/21/2020 PCP: System, Provider Not In   Brief Narrative:  55 year old female with history of obesity, hepatic steatosis, uterine fibroid, GI bleed, osteoarthritis presented with black stools.  On presentation, hemoglobin was 11.1 and subsequently 10.4.  She was started on IV Protonix.  GI was consulted.  Assessment & Plan:   Possible upper GI bleeding presenting with black stools Acute blood loss anemia -CT angio abdomen/pelvis was negative for any acute GI bleeding but showed colonic diverticulosis without acute diverticulitis; large hiatal hernia and suspected hepatic steatosis -Currently on IV Protonix twice a day.  GI following and planning for EGD today.  Follow further GI recommendations.  Continue n.p.o. for now. -Hemoglobin 11.1 on presentation.  Hemoglobin 9 today.  Monitor H&H  Leukocytosis -Possibly reactive.  Resolved  Dyslipidemia -Continue statin  GERD -Continue PPI as above  Hepatic steatosis -Outpatient follow-up with GI  Morbid obesity -Outpatient follow-up    DVT prophylaxis: SCDs Code Status: Full Family Communication: None at bedside Disposition Plan: Status is: Observation  The patient will require care spanning > 2 midnights and should be moved to inpatient because: Inpatient level of care appropriate due to severity of illness  Dispo: The patient is from: Home              Anticipated d/c is to: Home              Patient currently is not medically stable to d/c.   Difficult to place patient No  Consultants: GI  Procedures: None  Antimicrobials: None   Subjective: Patient seen and examined at bedside.  Denies any black or bloody stools or vomiting since admission.  Denies worsening abdominal pain.  Objective: Vitals:   09/21/20 1731 09/21/20 2018 09/22/20 0455 09/22/20 0730   BP: (!) 105/92 (!) 155/86 131/84 134/82  Pulse: 99 97 87 86  Resp: 17 19 19 17   Temp: 97.8 F (36.6 C) 98.3 F (36.8 C) 98 F (36.7 C) 98.1 F (36.7 C)  TempSrc:      SpO2: 98% 95% 96% 93%  Weight:      Height:        Intake/Output Summary (Last 24 hours) at 09/22/2020 1025 Last data filed at 09/22/2020 0200 Gross per 24 hour  Intake 820 ml  Output --  Net 820 ml   Filed Weights   09/21/20 1127  Weight: 104.3 kg    Examination:  General exam: Appears calm and comfortable.  Currently on room air. Respiratory system: Bilateral decreased breath sounds at bases Cardiovascular system: S1 & S2 heard, Rate controlled Gastrointestinal system: Abdomen is morbidly obese, nondistended, soft and nontender. Normal bowel sounds heard. Extremities: No cyanosis, clubbing, edema  Central nervous system: Alert and oriented. No focal neurological deficits. Moving extremities Skin: No rashes, lesions or ulcers Psychiatry: Judgement and insight appear normal. Mood & affect appropriate.     Data Reviewed: I have personally reviewed following labs and imaging studies  CBC: Recent Labs  Lab 09/21/20 1130 09/21/20 1503 09/22/20 0430  WBC 11.5*  --  8.6  HGB 11.1* 10.4* 9.0*  HCT 34.8* 34.5* 28.3*  MCV 90.2  --  91.0  PLT 368  --  289   Basic Metabolic Panel: Recent Labs  Lab 09/21/20 1130 09/22/20 0430  NA 139 141  K 4.5 3.5  CL 110 114*  CO2 23 23  GLUCOSE 133* 101*  BUN 30* 16  CREATININE 0.52 0.53  CALCIUM 9.1 8.4*  MG 1.9  --    GFR: Estimated Creatinine Clearance: 90.7 mL/min (by C-G formula based on SCr of 0.53 mg/dL). Liver Function Tests: Recent Labs  Lab 09/21/20 1130  AST 16  ALT 10  ALKPHOS 88  BILITOT 1.0  PROT 7.2  ALBUMIN 3.4*   No results for input(s): LIPASE, AMYLASE in the last 168 hours. No results for input(s): AMMONIA in the last 168 hours. Coagulation Profile: Recent Labs  Lab 09/22/20 0430  INR 1.0   Cardiac Enzymes: No results  for input(s): CKTOTAL, CKMB, CKMBINDEX, TROPONINI in the last 168 hours. BNP (last 3 results) No results for input(s): PROBNP in the last 8760 hours. HbA1C: No results for input(s): HGBA1C in the last 72 hours. CBG: No results for input(s): GLUCAP in the last 168 hours. Lipid Profile: No results for input(s): CHOL, HDL, LDLCALC, TRIG, CHOLHDL, LDLDIRECT in the last 72 hours. Thyroid Function Tests: No results for input(s): TSH, T4TOTAL, FREET4, T3FREE, THYROIDAB in the last 72 hours. Anemia Panel: No results for input(s): VITAMINB12, FOLATE, FERRITIN, TIBC, IRON, RETICCTPCT in the last 72 hours. Sepsis Labs: No results for input(s): PROCALCITON, LATICACIDVEN in the last 168 hours.  Recent Results (from the past 240 hour(s))  Resp Panel by RT-PCR (Flu A&B, Covid) Nasopharyngeal Swab     Status: None   Collection Time: 09/21/20  4:15 PM   Specimen: Nasopharyngeal Swab; Nasopharyngeal(NP) swabs in vial transport medium  Result Value Ref Range Status   SARS Coronavirus 2 by RT PCR NEGATIVE NEGATIVE Final    Comment: (NOTE) SARS-CoV-2 target nucleic acids are NOT DETECTED.  The SARS-CoV-2 RNA is generally detectable in upper respiratory specimens during the acute phase of infection. The lowest concentration of SARS-CoV-2 viral copies this assay can detect is 138 copies/mL. A negative result does not preclude SARS-Cov-2 infection and should not be used as the sole basis for treatment or other patient management decisions. A negative result may occur with  improper specimen collection/handling, submission of specimen other than nasopharyngeal swab, presence of viral mutation(s) within the areas targeted by this assay, and inadequate number of viral copies(<138 copies/mL). A negative result must be combined with clinical observations, patient history, and epidemiological information. The expected result is Negative.  Fact Sheet for Patients:   BloggerCourse.com  Fact Sheet for Healthcare Providers:  SeriousBroker.it  This test is no t yet approved or cleared by the [place] FDA and  has been authorized for detection and/or diagnosis of SARS-CoV-2 by FDA under an Emergency Use Authorization (EUA). This EUA will remain  in effect (meaning this test can be used) for the duration of the COVID-19 declaration under Section 564(b)(1) of the Act, 21 U.S.C.section 360bbb-3(b)(1), unless the authorization is terminated  or revoked sooner.       Influenza A by PCR NEGATIVE NEGATIVE Final   Influenza B by PCR NEGATIVE NEGATIVE Final    Comment: (NOTE) The Xpert Xpress SARS-CoV-2/FLU/RSV plus assay is intended as an aid in the diagnosis of influenza from Nasopharyngeal swab specimens and should not be used as a sole basis for treatment. Nasal washings and aspirates are unacceptable for Xpert Xpress SARS-CoV-2/FLU/RSV testing.  Fact Sheet for Patients: BloggerCourse.com  Fact Sheet for Healthcare Providers: SeriousBroker.it  This test is not yet approved or cleared by the [place] FDA and has been authorized for detection and/or diagnosis of SARS-CoV-2 by FDA under an  Emergency Use Authorization (EUA). This EUA will remain in effect (meaning this test can be used) for the duration of the COVID-19 declaration under Section 564(b)(1) of the Act, 21 U.S.C. section 360bbb-3(b)(1), unless the authorization is terminated or revoked.  Performed at [facility] [address] [place], [place] [address]          Radiology Studies: CT Angio Abd/Pel W and/or Wo Contrast  Result Date: 09/21/2020 CLINICAL DATA:  Recurrent GI bleeding. EXAM: CTA ABDOMEN AND PELVIS WITHOUT AND WITH CONTRAST TECHNIQUE: Multidetector CT imaging of the abdomen and pelvis was performed using the standard protocol during bolus  administration of intravenous contrast. Multiplanar reconstructed images and MIPs were obtained and reviewed to evaluate the vascular anatomy. CONTRAST:  OMNIPAQUE IOHEXOL 350 MG/ML SOLN COMPARISON:  CT abdomen and pelvis-04/04/2020; chest CT-04/17/2020 FINDINGS: VASCULAR Aorta: There is no significant atherosclerotic plaque within a mildly tortuous but normal caliber abdominal aorta. No abdominal aortic dissection or periaortic stranding. Celiac: Widely patent without hemodynamically significant narrowing. SMA: There is a moderate amount of eccentric noncalcified atherosclerotic plaque involving the origin and proximal aspect of the SMA (image 64, series 5 which results in at least 50% luminal narrowing (axial image 64, series 5; coronal image 63, series 9). The distal tributaries of the SMA appear widely patent without discrete intraluminal filling defect to suggest distal embolism. Renals: Solitary bilaterally; there is a moderate amount of eccentric predominantly calcified atherosclerotic plaque involving the undersurface of the proximal aspect of the left renal artery which approaches 50% luminal narrowing (coronal image 68, series 9, however this is without associated delayed renal enhancement or asymmetric renal atrophy. The right renal artery is widely patent without hemodynamically significant narrowing. No vessel irregularity to suggest FMD. IMA: Widely patent without a hemodynamically significant narrowing. Inflow: The bilateral common and external iliac arteries are of normal caliber and widely patent without hemodynamically significant narrowing. The bilateral internal iliac arteries are mildly disease though patent and of normal caliber. Proximal Outflow: The bilateral common and imaged portions of the bilateral deep and superficial femoral arteries are of normal caliber and widely patent without a hemodynamically significant stenosis. Veins: The IVC and pelvic venous systems appear widely  patent. Review of the MIP images confirms the above findings. _________________________________________________________ NON-VASCULAR Lower chest: Limited visualization of the lower thorax is negative for focal airspace opacity or pleural effusion. Borderline cardiomegaly.  No pericardial effusion. Hepatobiliary: There is mild decreased attenuation hepatic parenchyma suggestive of hepatic steatosis. Potential mild nodularity hepatic contour. No discrete hepatic lesions. Normal appearance of the gallbladder given underdistention. No radiopaque gallstones. No intra or extrahepatic biliary ductal dilatation. No ascites. Pancreas: Normal appearance of the pancreas. Spleen: Normal appearance of the spleen. Adrenals/Urinary Tract: There is symmetric enhancement of the bilateral kidneys. No evidence of nephrolithiasis. No discrete renal lesions. No urinary obstruction or perinephric stranding. Normal appearance of the bilateral adrenal glands. Normal appearance of the urinary bladder given degree of distention. Stomach/Bowel: There are no discrete areas of intraluminal contrast extravasation to suggest acute GI bleeding. Redemonstrated large hiatal hernia containing near the entirety of the stomach, similar to chest CT performed 04/17/2020 and without definitive evidence of volvulus. Scattered colonic diverticulosis without discrete area of superimposed acute diverticulitis. Normal appearance of the terminal ileum and the retrocecal appendix. There is mild gaseous distension of several loops of proximal jejunum, nonspecific though similar to abdominal CT performed 04/04/2020 invests likely normal for this patient. No discrete areas of wall thickening. No evidence of enteric obstruction.  No pneumoperitoneum, pneumatosis or portal venous gas. Lymphatic: No bulky retroperitoneal, mesenteric, pelvic or inguinal lymphadenopathy. Reproductive: Redemonstrated approximately 3.7 x 2.5 cm partially exophytic fibroid arising from the  posterior aspect of the uterus (image 75, series 7) as well as an approximately 2.5 x 1.5 cm left-sided potential broad ligament uterine fibroid (image 72, series 7), unchanged. Dystrophic calcifications again noted involving the anterior aspect of the uterus, potentially a degenerating fibroid. No discrete adnexal lesion. Previously questioned endometrial thickening is not definitely seen on the present examination. No free fluid the pelvic cul-de-sac. Other: Tiny mesenteric fat containing periumbilical hernia. Musculoskeletal: No acute or aggressive osseous abnormalities. Moderate to severe facet degenerative change of the lower lumbar spine bilaterally. IMPRESSION: VASCULAR 1. No evidence of abdominal aortic aneurysm or dissection. 2. Mixed calcified and noncalcified atherosclerotic plaque results in approximately 50% luminal narrowing involving the proximal aspect of the SMA however both the celiac and IMA appear widely patent without hemodynamically significant narrowing. Aortic Atherosclerosis (ICD10-I70.0). 3. Calcified atherosclerotic plaque results in approximately 50% luminal narrowing of the left renal artery however this finding is without associated delayed renal enhancement or asymmetric renal atrophy. NON-VASCULAR 1. No discrete areas of intraluminal contrast extravasation to suggest acute GI bleeding. 2. Scattered colonic diverticulosis without evidence superimposed acute diverticulitis. 3. Large hiatal hernia containing the majority of the stomach, similar to chest CT performed 04/17/2020 and without evidence of volvulus. 4. Suspected hepatic steatosis with mild nodularity hepatic contour as could be seen in the setting of early cirrhotic change. Correlation with LFTs is advised. 5. Redemonstrated fibroid uterus. Electronically Signed   By: Simonne Come M.D.   On: 09/21/2020 13:42        Scheduled Meds:  atorvastatin  40 mg Oral QHS   [START ON 09/25/2020] pantoprazole  40 mg Intravenous Q12H    Continuous Infusions:  sodium chloride 20 mL/hr at 09/22/20 1007   pantoprazole 8 mg/hr (09/22/20 0731)          Glade Lloyd, MD [facility] 09/22/2020, 10:25 AM

## 2020-09-22 NOTE — H&P (View-Only) (Signed)
[facility] Inpatient Follow-up/Progress Note   Patient ID: Kimberly Mcdonald is a 55 y.o. female.  Overnight Events / Subjective Findings NAEON. Pt with hgb decrease to 9. Otherwise VSS. Denies abdominal pain, nausea, vomiting. No further dark stools  Npo since midnight.  No other acute gi complaints.  Review of Systems  Constitutional:  Negative for activity change, appetite change, fatigue, fever and unexpected weight change.  HENT:  Negative for trouble swallowing and voice change.   Respiratory:  Negative for shortness of breath and wheezing.   Cardiovascular:  Negative for chest pain and palpitations.  Gastrointestinal:  Negative for abdominal distention, abdominal pain, anal bleeding, blood in stool, constipation, diarrhea, nausea, rectal pain and vomiting.  Musculoskeletal:  Negative for arthralgias and myalgias.  Skin:  Negative for color change and pallor.  Neurological:  Negative for dizziness, syncope and weakness.  Psychiatric/Behavioral:  Negative for confusion.   All other systems reviewed and are negative.   Medications  Current Facility-Administered Medications:    0.9 %  sodium chloride infusion, , Intravenous, Continuous, Aadin Gaut Michael, DO, Last Rate: 20 mL/hr at 09/22/20 1007, New Bag at 09/22/20 1007   acetaminophen (TYLENOL) tablet 650 mg, 650 mg, Oral, Q6H PRN **OR** acetaminophen (TYLENOL) suppository 650 mg, 650 mg, Rectal, Q6H PRN, Cox, Amy N, DO   albuterol (PROVENTIL) (2.5 MG/3ML) 0.083% nebulizer solution 2.5 mg, 2.5 mg, Nebulization, Q6H PRN, Cox, Amy N, DO   atorvastatin (LIPITOR) tablet 40 mg, 40 mg, Oral, QHS, Cox, Amy N, DO, 40 mg at 09/21/20 2302   butalbital-acetaminophen-caffeine (FIORICET) 50-325-40 MG per tablet 1 tablet, 1 tablet, Oral, Q6H PRN, Cox, Amy N, DO   fluticasone (FLONASE) 50 MCG/ACT nasal spray 2 spray, 2 spray, Each Nare, Daily PRN, Cox, Amy N, DO   ondansetron (ZOFRAN) tablet 4 mg, 4 mg, Oral, Q6H PRN  **OR** ondansetron (ZOFRAN) injection 4 mg, 4 mg, Intravenous, Q6H PRN, Cox, Amy N, DO   [START ON 09/25/2020] pantoprazole (PROTONIX) injection 40 mg, 40 mg, Intravenous, Q12H, Cox, Amy N, DO   pantoprozole (PROTONIX) 80 mg /NS 100 mL infusion, 8 mg/hr, Intravenous, Continuous, Cox, Amy N, DO, Last Rate: 10 mL/hr at 09/22/20 0731, 8 mg/hr at 09/22/20 0731  sodium chloride 20 mL/hr at 09/22/20 1007   pantoprazole 8 mg/hr (09/22/20 0731)    acetaminophen **OR** acetaminophen, albuterol, butalbital-acetaminophen-caffeine, fluticasone, ondansetron **OR** ondansetron (ZOFRAN) IV   Objective    Vitals:   09/21/20 1731 09/21/20 2018 09/22/20 0455 09/22/20 0730  BP: (!) 105/92 (!) 155/86 131/84 134/82  Pulse: 99 97 87 86  Resp: 17 19 19 17  Temp: 97.8 F (36.6 C) 98.3 F (36.8 C) 98 F (36.7 C) 98.1 F (36.7 C)  TempSrc:      SpO2: 98% 95% 96% 93%  Weight:      Height:         Physical Exam Vitals reviewed.  Constitutional:      General: She is not in acute distress.    Appearance: Normal appearance. She is obese. She is not ill-appearing, toxic-appearing or diaphoretic.  HENT:     Head: Normocephalic and atraumatic.     Nose: Nose normal.     Mouth/Throat:     Mouth: Mucous membranes are moist.     Pharynx: Oropharynx is clear.  Eyes:     General: No scleral icterus.    Extraocular Movements: Extraocular movements intact.  Cardiovascular:     Rate and Rhythm: Normal rate and regular rhythm.       Heart sounds: Normal heart sounds. No murmur heard.   No friction rub. No gallop.  Pulmonary:     Effort: Pulmonary effort is normal. No respiratory distress.     Breath sounds: Normal breath sounds. No wheezing, rhonchi or rales.  Abdominal:     General: Bowel sounds are normal. There is no distension.     Palpations: Abdomen is soft.     Tenderness: There is no abdominal tenderness. There is no guarding or rebound.  Musculoskeletal:     Cervical back: Neck supple.     Right  lower leg: No edema.     Left lower leg: No edema.  Skin:    General: Skin is warm and dry.     Coloration: Skin is not jaundiced or pale.  Neurological:     General: No focal deficit present.     Mental Status: She is alert and oriented to person, place, and time. Mental status is at baseline.  Psychiatric:        Mood and Affect: Mood normal.        Behavior: Behavior normal.        Thought Content: Thought content normal.        Judgment: Judgment normal.     Laboratory Data Recent Labs  Lab 09/21/20 1130 09/21/20 1503 09/22/20 0430  WBC 11.5*  --  8.6  HGB 11.1* 10.4* 9.0*  HCT 34.8* 34.5* 28.3*  PLT 368  --  289   Recent Labs  Lab 09/21/20 1130 09/22/20 0430  NA 139 141  K 4.5 3.5  CL 110 114*  CO2 23 23  BUN 30* 16  CREATININE 0.52 0.53  CALCIUM 9.1 8.4*  PROT 7.2  --   BILITOT 1.0  --   ALKPHOS 88  --   ALT 10  --   AST 16  --   GLUCOSE 133* 101*   Recent Labs  Lab 09/22/20 0430  INR 1.0      Imaging Studies: CT Angio Abd/Pel W and/or Wo Contrast  Result Date: 09/21/2020 CLINICAL DATA:  Recurrent GI bleeding. EXAM: CTA ABDOMEN AND PELVIS WITHOUT AND WITH CONTRAST TECHNIQUE: Multidetector CT imaging of the abdomen and pelvis was performed using the standard protocol during bolus administration of intravenous contrast. Multiplanar reconstructed images and MIPs were obtained and reviewed to evaluate the vascular anatomy. CONTRAST:  OMNIPAQUE IOHEXOL 350 MG/ML SOLN COMPARISON:  CT abdomen and pelvis-04/04/2020; chest CT-04/17/2020 FINDINGS: VASCULAR Aorta: There is no significant atherosclerotic plaque within a mildly tortuous but normal caliber abdominal aorta. No abdominal aortic dissection or periaortic stranding. Celiac: Widely patent without hemodynamically significant narrowing. SMA: There is a moderate amount of eccentric noncalcified atherosclerotic plaque involving the origin and proximal aspect of the SMA (image 64, series 5 which results in at  least 50% luminal narrowing (axial image 64, series 5; coronal image 63, series 9). The distal tributaries of the SMA appear widely patent without discrete intraluminal filling defect to suggest distal embolism. Renals: Solitary bilaterally; there is a moderate amount of eccentric predominantly calcified atherosclerotic plaque involving the undersurface of the proximal aspect of the left renal artery which approaches 50% luminal narrowing (coronal image 68, series 9, however this is without associated delayed renal enhancement or asymmetric renal atrophy. The right renal artery is widely patent without hemodynamically significant narrowing. No vessel irregularity to suggest FMD. IMA: Widely patent without a hemodynamically significant narrowing. Inflow: The bilateral common and external iliac arteries are of normal caliber and widely patent without  hemodynamically significant narrowing. The bilateral internal iliac arteries are mildly disease though patent and of normal caliber. Proximal Outflow: The bilateral common and imaged portions of the bilateral deep and superficial femoral arteries are of normal caliber and widely patent without a hemodynamically significant stenosis. Veins: The IVC and pelvic venous systems appear widely patent. Review of the MIP images confirms the above findings. _________________________________________________________ NON-VASCULAR Lower chest: Limited visualization of the lower thorax is negative for focal airspace opacity or pleural effusion. Borderline cardiomegaly.  No pericardial effusion. Hepatobiliary: There is mild decreased attenuation hepatic parenchyma suggestive of hepatic steatosis. Potential mild nodularity hepatic contour. No discrete hepatic lesions. Normal appearance of the gallbladder given underdistention. No radiopaque gallstones. No intra or extrahepatic biliary ductal dilatation. No ascites. Pancreas: Normal appearance of the pancreas. Spleen: Normal appearance of  the spleen. Adrenals/Urinary Tract: There is symmetric enhancement of the bilateral kidneys. No evidence of nephrolithiasis. No discrete renal lesions. No urinary obstruction or perinephric stranding. Normal appearance of the bilateral adrenal glands. Normal appearance of the urinary bladder given degree of distention. Stomach/Bowel: There are no discrete areas of intraluminal contrast extravasation to suggest acute GI bleeding. Redemonstrated large hiatal hernia containing near the entirety of the stomach, similar to chest CT performed 04/17/2020 and without definitive evidence of volvulus. Scattered colonic diverticulosis without discrete area of superimposed acute diverticulitis. Normal appearance of the terminal ileum and the retrocecal appendix. There is mild gaseous distension of several loops of proximal jejunum, nonspecific though similar to abdominal CT performed 04/04/2020 invests likely normal for this patient. No discrete areas of wall thickening. No evidence of enteric obstruction. No pneumoperitoneum, pneumatosis or portal venous gas. Lymphatic: No bulky retroperitoneal, mesenteric, pelvic or inguinal lymphadenopathy. Reproductive: Redemonstrated approximately 3.7 x 2.5 cm partially exophytic fibroid arising from the posterior aspect of the uterus (image 75, series 7) as well as an approximately 2.5 x 1.5 cm left-sided potential broad ligament uterine fibroid (image 72, series 7), unchanged. Dystrophic calcifications again noted involving the anterior aspect of the uterus, potentially a degenerating fibroid. No discrete adnexal lesion. Previously questioned endometrial thickening is not definitely seen on the present examination. No free fluid the pelvic cul-de-sac. Other: Tiny mesenteric fat containing periumbilical hernia. Musculoskeletal: No acute or aggressive osseous abnormalities. Moderate to severe facet degenerative change of the lower lumbar spine bilaterally. IMPRESSION: VASCULAR 1. No  evidence of abdominal aortic aneurysm or dissection. 2. Mixed calcified and noncalcified atherosclerotic plaque results in approximately 50% luminal narrowing involving the proximal aspect of the SMA however both the celiac and IMA appear widely patent without hemodynamically significant narrowing. Aortic Atherosclerosis (ICD10-I70.0). 3. Calcified atherosclerotic plaque results in approximately 50% luminal narrowing of the left renal artery however this finding is without associated delayed renal enhancement or asymmetric renal atrophy. NON-VASCULAR 1. No discrete areas of intraluminal contrast extravasation to suggest acute GI bleeding. 2. Scattered colonic diverticulosis without evidence superimposed acute diverticulitis. 3. Large hiatal hernia containing the majority of the stomach, similar to chest CT performed 04/17/2020 and without evidence of volvulus. 4. Suspected hepatic steatosis with mild nodularity hepatic contour as could be seen in the setting of early cirrhotic change. Correlation with LFTs is advised. 5. Redemonstrated fibroid uterus. Electronically Signed   By: Simonne Come M.D.   On: 09/21/2020 13:42    Assessment:   #Suspected upper GI bleeding             -History of Cameron's erosions             -Tachycardic  on presentation- resolved.  BUN 30 / creatinine 0.52             - melena  -hgb descended to 9 on 09/22/20 # obesity # HTN   # Nodular contour to liver on imaging             - outpatient w/u for liver disease. Does not have history             - no signs of portal htn on last egd 03/2020             - lfts wnl; plt nml   # h/o cva             - no anti plt therapy  Plan:  EGD today. Npo since midnight Protonix 40 mg iv bid Monitor H&H with transfusion and resuscitation as per primary team This morning's labs were reviewed Monitor for signs of further gi bleeding Avoid NSAIDs and DVT prophylaxis Outpatient work-up for potential liver disease Supportive care as per  primary team  Further recommendations pending endoscopy  Esophagogastroduodenoscopy with possible biopsy, control of bleeding, polypectomy, and interventions as necessary has been discussed with the patient/patient representative. Informed consent was obtained from the patient/patient representative after explaining the indication, nature, and risks of the procedure including but not limited to death, bleeding, perforation, missed neoplasm/lesions, cardiorespiratory compromise, and reaction to medications. Opportunity for questions was given and appropriate answers were provided. Patient/patient representative has verbalized understanding is amenable to undergoing the procedure.  I personally performed the service.  Management of other medical comorbidities as per primary team  Thank you for allowing [place] to participate in this patient's care. Please don't hesitate to call if any questions or concerns arise.   Jaynie Jake, DO [facility]  Portions of the record may have been created with voice recognition software. Occasional wrong-word or 'sound-a-like' substitutions may have occurred due to the inherent limitations of voice recognition software.  Read the chart carefully and recognize, using context, where substitutions may have occurred.

## 2020-09-22 NOTE — Op Note (Signed)
[facility] Patient Name: Nichole Neyer Procedure Date: 09/22/2020 10:52 AM MRN: [id number] Account #: [id number] Date of Birth: 1965/12/14 Admit Type: Inpatient Age: 55 Room: [facility] ENDO ROOM 4 Gender: Female Note Status: Finalized Instrument Name: Upper Endoscope [id number] Procedure:             Upper GI endoscopy Indications:           Melena Providers:             Annamaria Helling DO, DO Medicines:             Monitored Anesthesia Care Complications:         No immediate complications. Estimated blood loss:                         Minimal. Procedure:             Pre-Anesthesia Assessment:                        - Prior to the procedure, a History and Physical was                         performed, and patient medications and allergies were                         reviewed. The patient is competent. The risks and                         benefits of the procedure and the sedation options and                         risks were discussed with the patient. All questions                         were answered and informed consent was obtained.                         Patient identification and proposed procedure were                         verified by the physician, the nurse, the anesthetist                         and the technician in the endoscopy suite. Mental                         Status Examination: alert and oriented. Airway                         Examination: normal oropharyngeal airway and neck                         mobility. Respiratory Examination: clear to                         auscultation. CV Examination: RRR, no murmurs, no S3                         or S4. Prophylactic Antibiotics: The patient does not  require prophylactic antibiotics. Prior                         Anticoagulants: The patient has taken no previous                         anticoagulant or antiplatelet agents. ASA Grade                          Assessment: III - A patient with severe systemic                         disease. After reviewing the risks and benefits, the                         patient was deemed in satisfactory condition to                         undergo the procedure. The anesthesia plan was to use                         monitored anesthesia care (MAC). Immediately prior to                         administration of medications, the patient was                         re-assessed for adequacy to receive sedatives. The                         heart rate, respiratory rate, oxygen saturations,                         blood pressure, adequacy of pulmonary ventilation, and                         response to care were monitored throughout the                         procedure. The physical status of the patient was                         re-assessed after the procedure.                        After obtaining informed consent, the endoscope was                         passed under direct vision. Throughout the procedure,                         the patient's blood pressure, pulse, and oxygen                         saturations were monitored continuously. The Endoscope                         was introduced through the mouth, and advanced to the  second part of duodenum. The upper GI endoscopy was                         accomplished without difficulty. The patient tolerated                         the procedure well. Findings:      The duodenal bulb, first portion of the duodenum and second portion of       the duodenum were normal. Estimated blood loss: none.      A large hiatal hernia was present. Estimated blood loss: none.      A few localized small erosions with no bleeding and no stigmata of       recent bleeding were found at the in the hiatal hernia consistent with       cameron's erosions. Estimated blood loss: none.      The exam of the stomach was otherwise normal.       Normal mucosa was found in the entire examined stomach. Biopsies were       taken with a cold forceps for Helicobacter pylori testing. Estimated       blood loss was minimal.      Esophagogastric landmarks were identified: the Z-line was found at 32 cm       and the gastroesophageal junction was found at 32 cm from the incisors.      The esophagus was tortuous, but exam of the esophagus was otherwise       normal. Impression:            - Normal duodenal bulb, first portion of the duodenum                         and second portion of the duodenum.                        - Large hiatal hernia.                        - Erosive gastropathy with no bleeding and no stigmata                         of recent bleeding.                        - Normal mucosa was found in the entire stomach.                         Biopsied.                        - Esophagogastric landmarks identified. Recommendation:        - Return patient to hospital ward for possible                         discharge same day.                        - Resume previous diet.                        - Continue present medications.                        -  Follow an antireflux regimen indefinitely.                        - Use Protonix (pantoprazole) 40 mg PO BID                         indefinitely.                        - No aspirin, ibuprofen, naproxen, or other                         non-steroidal anti-inflammatory drugs indefinitely as                         possible.                        - Await pathology results. Procedure Code(s):     --- Professional ---                        [id number], Esophagogastroduodenoscopy, flexible,                         transoral; with biopsy, single or multiple Diagnosis Code(s):     --- Professional ---                        K44.9, Diaphragmatic hernia without obstruction or                         gangrene                        K31.89, Other diseases of stomach and duodenum                         K92.1, Melena (includes Hematochezia) CPT copyright 2019 [employer]. All rights reserved. The codes documented in this report are preliminary and upon coder review may  be revised to meet current compliance requirements. Attending Participation:      I personally performed the entire procedure. Volney American, DO Annamaria Helling DO, DO 09/22/2020 11:32:12 AM This report has been signed electronically. Number of Addenda: 0 Note Initiated On: 09/22/2020 10:52 AM Estimated Blood Loss:  Estimated blood loss was minimal.      [facility]

## 2020-09-22 NOTE — Transfer of Care (Signed)
Immediate Anesthesia Transfer of Care Note  Patient: [name]  Procedure(s) Performed: ESOPHAGOGASTRODUODENOSCOPY (EGD) WITH PROPOFOL  Patient Location: PACU  Anesthesia Type:General  Level of Consciousness: awake and patient cooperative  Airway & Oxygen Therapy: Patient Spontanous Breathing and Patient connected to nasal cannula oxygen  Post-op Assessment: Report given to RN and Post -op Vital signs reviewed and stable  Post vital signs: Reviewed and stable  Last Vitals:  Vitals Value Taken Time  BP    Temp    Pulse 87 09/22/20 1135  Resp 16 09/22/20 1135  SpO2 100 % 09/22/20 1135  Vitals shown include unvalidated device data.  Last Pain:  Vitals:   09/21/20 2015  TempSrc:   PainSc: 0-No pain         Complications: No notable events documented.

## 2020-09-22 NOTE — Progress Notes (Signed)
[facility]   Patient ID: Kimberly Mcdonald is a 55 y.o. female.  Overnight Events / Subjective Findings NAEON. Pt with hgb decrease to 9. Otherwise VSS. Denies abdominal pain, nausea, vomiting. No further dark stools  Npo since midnight.  No other acute gi complaints.  Review of Systems  Constitutional:  Negative for activity change, appetite change, fatigue, fever and unexpected weight change.  HENT:  Negative for trouble swallowing and voice change.   Respiratory:  Negative for shortness of breath and wheezing.   Cardiovascular:  Negative for chest pain and palpitations.  Gastrointestinal:  Negative for abdominal distention, abdominal pain, anal bleeding, blood in stool, constipation, diarrhea, nausea, rectal pain and vomiting.  Musculoskeletal:  Negative for arthralgias and myalgias.  Skin:  Negative for color change and pallor.  Neurological:  Negative for dizziness, syncope and weakness.  Psychiatric/Behavioral:  Negative for confusion.   All other systems reviewed and are negative.   Medications  Current Facility-Administered Medications:    0.9 %  sodium chloride infusion, , Intravenous, Continuous, Jaynie CollinsRusso, Casady Voshell Michael, DO, Last Rate: 20 mL/hr at 09/22/20 1007, New Bag at 09/22/20 1007   acetaminophen (TYLENOL) tablet 650 mg, 650 mg, Oral, Q6H PRN **OR** acetaminophen (TYLENOL) suppository 650 mg, 650 mg, Rectal, Q6H PRN, Cox, Amy N, DO   albuterol (PROVENTIL) (2.5 MG/3ML) 0.083% nebulizer solution 2.5 mg, 2.5 mg, Nebulization, Q6H PRN, Cox, Amy N, DO   atorvastatin (LIPITOR) tablet 40 mg, 40 mg, Oral, QHS, Cox, Amy N, DO, 40 mg at 09/21/20 2302   butalbital-acetaminophen-caffeine (FIORICET) 50-325-40 MG per tablet 1 tablet, 1 tablet, Oral, Q6H PRN, Cox, Amy N, DO   fluticasone (FLONASE) 50 MCG/ACT nasal spray 2 spray, 2 spray, Each Nare, Daily PRN, Cox, Amy N, DO   ondansetron (ZOFRAN) tablet 4 mg, 4 mg, Oral, Q6H PRN  **OR** ondansetron (ZOFRAN) injection 4 mg, 4 mg, Intravenous, Q6H PRN, Cox, Amy N, DO   [START ON 09/25/2020] pantoprazole (PROTONIX) injection 40 mg, 40 mg, Intravenous, Q12H, Cox, Amy N, DO   pantoprozole (PROTONIX) 80 mg /NS 100 mL infusion, 8 mg/hr, Intravenous, Continuous, Cox, Amy N, DO, Last Rate: 10 mL/hr at 09/22/20 0731, 8 mg/hr at 09/22/20 0731  sodium chloride 20 mL/hr at 09/22/20 1007   pantoprazole 8 mg/hr (09/22/20 0731)    acetaminophen **OR** acetaminophen, albuterol, butalbital-acetaminophen-caffeine, fluticasone, ondansetron **OR** ondansetron (ZOFRAN) IV   Objective    Vitals:   09/21/20 1731 09/21/20 2018 09/22/20 0455 09/22/20 0730  BP: (!) 105/92 (!) 155/86 131/84 134/82  Pulse: 99 97 87 86  Resp: 17 19 19 17   Temp: 97.8 F (36.6 C) 98.3 F (36.8 C) 98 F (36.7 C) 98.1 F (36.7 C)  TempSrc:      SpO2: 98% 95% 96% 93%  Weight:      Height:         Physical Exam Vitals reviewed.  Constitutional:      General: She is not in acute distress.    Appearance: Normal appearance. She is obese. She is not ill-appearing, toxic-appearing or diaphoretic.  HENT:     Head: Normocephalic and atraumatic.     Nose: Nose normal.     Mouth/Throat:     Mouth: Mucous membranes are moist.     Pharynx: Oropharynx is clear.  Eyes:     General: No scleral icterus.    Extraocular Movements: Extraocular movements intact.  Cardiovascular:     Rate and Rhythm: Normal rate and regular rhythm.  Heart sounds: Normal heart sounds. No murmur heard.   No friction rub. No gallop.  Pulmonary:     Effort: Pulmonary effort is normal. No respiratory distress.     Breath sounds: Normal breath sounds. No wheezing, rhonchi or rales.  Abdominal:     General: Bowel sounds are normal. There is no distension.     Palpations: Abdomen is soft.     Tenderness: There is no abdominal tenderness. There is no guarding or rebound.  Musculoskeletal:     Cervical back: Neck supple.     Right  lower leg: No edema.     Left lower leg: No edema.  Skin:    General: Skin is warm and dry.     Coloration: Skin is not jaundiced or pale.  Neurological:     General: No focal deficit present.     Mental Status: She is alert and oriented to person, place, and time. Mental status is at baseline.  Psychiatric:        Mood and Affect: Mood normal.        Behavior: Behavior normal.        Thought Content: Thought content normal.        Judgment: Judgment normal.     Laboratory Data Recent Labs  Lab 09/21/20 1130 09/21/20 1503 09/22/20 0430  WBC 11.5*  --  8.6  HGB 11.1* 10.4* 9.0*  HCT 34.8* 34.5* 28.3*  PLT 368  --  289   Recent Labs  Lab 09/21/20 1130 09/22/20 0430  NA 139 141  K 4.5 3.5  CL 110 114*  CO2 23 23  BUN 30* 16  CREATININE 0.52 0.53  CALCIUM 9.1 8.4*  PROT 7.2  --   BILITOT 1.0  --   ALKPHOS 88  --   ALT 10  --   AST 16  --   GLUCOSE 133* 101*   Recent Labs  Lab 09/22/20 0430  INR 1.0      Imaging Studies: CT Angio Abd/Pel W and/or Wo Contrast  Result Date: 09/21/2020 CLINICAL DATA:  Recurrent GI bleeding. EXAM: CTA ABDOMEN AND PELVIS WITHOUT AND WITH CONTRAST TECHNIQUE: Multidetector CT imaging of the abdomen and pelvis was performed using the standard protocol during bolus administration of intravenous contrast. Multiplanar reconstructed images and MIPs were obtained and reviewed to evaluate the vascular anatomy. CONTRAST:  OMNIPAQUE IOHEXOL 350 MG/ML SOLN COMPARISON:  CT abdomen and pelvis-04/04/2020; chest CT-04/17/2020 FINDINGS: VASCULAR Aorta: There is no significant atherosclerotic plaque within a mildly tortuous but normal caliber abdominal aorta. No abdominal aortic dissection or periaortic stranding. Celiac: Widely patent without hemodynamically significant narrowing. SMA: There is a moderate amount of eccentric noncalcified atherosclerotic plaque involving the origin and proximal aspect of the SMA (image 64, series 5 which results in at  least 50% luminal narrowing (axial image 64, series 5; coronal image 63, series 9). The distal tributaries of the SMA appear widely patent without discrete intraluminal filling defect to suggest distal embolism. Renals: Solitary bilaterally; there is a moderate amount of eccentric predominantly calcified atherosclerotic plaque involving the undersurface of the proximal aspect of the left renal artery which approaches 50% luminal narrowing (coronal image 68, series 9, however this is without associated delayed renal enhancement or asymmetric renal atrophy. The right renal artery is widely patent without hemodynamically significant narrowing. No vessel irregularity to suggest FMD. IMA: Widely patent without a hemodynamically significant narrowing. Inflow: The bilateral common and external iliac arteries are of normal caliber and widely patent without  hemodynamically significant narrowing. The bilateral internal iliac arteries are mildly disease though patent and of normal caliber. Proximal Outflow: The bilateral common and imaged portions of the bilateral deep and superficial femoral arteries are of normal caliber and widely patent without a hemodynamically significant stenosis. Veins: The IVC and pelvic venous systems appear widely patent. Review of the MIP images confirms the above findings. _________________________________________________________ NON-VASCULAR Lower chest: Limited visualization of the lower thorax is negative for focal airspace opacity or pleural effusion. Borderline cardiomegaly.  No pericardial effusion. Hepatobiliary: There is mild decreased attenuation hepatic parenchyma suggestive of hepatic steatosis. Potential mild nodularity hepatic contour. No discrete hepatic lesions. Normal appearance of the gallbladder given underdistention. No radiopaque gallstones. No intra or extrahepatic biliary ductal dilatation. No ascites. Pancreas: Normal appearance of the pancreas. Spleen: Normal appearance of  the spleen. Adrenals/Urinary Tract: There is symmetric enhancement of the bilateral kidneys. No evidence of nephrolithiasis. No discrete renal lesions. No urinary obstruction or perinephric stranding. Normal appearance of the bilateral adrenal glands. Normal appearance of the urinary bladder given degree of distention. Stomach/Bowel: There are no discrete areas of intraluminal contrast extravasation to suggest acute GI bleeding. Redemonstrated large hiatal hernia containing near the entirety of the stomach, similar to chest CT performed 04/17/2020 and without definitive evidence of volvulus. Scattered colonic diverticulosis without discrete area of superimposed acute diverticulitis. Normal appearance of the terminal ileum and the retrocecal appendix. There is mild gaseous distension of several loops of proximal jejunum, nonspecific though similar to abdominal CT performed 04/04/2020 invests likely normal for this patient. No discrete areas of wall thickening. No evidence of enteric obstruction. No pneumoperitoneum, pneumatosis or portal venous gas. Lymphatic: No bulky retroperitoneal, mesenteric, pelvic or inguinal lymphadenopathy. Reproductive: Redemonstrated approximately 3.7 x 2.5 cm partially exophytic fibroid arising from the posterior aspect of the uterus (image 75, series 7) as well as an approximately 2.5 x 1.5 cm left-sided potential broad ligament uterine fibroid (image 72, series 7), unchanged. Dystrophic calcifications again noted involving the anterior aspect of the uterus, potentially a degenerating fibroid. No discrete adnexal lesion. Previously questioned endometrial thickening is not definitely seen on the present examination. No free fluid the pelvic cul-de-sac. Other: Tiny mesenteric fat containing periumbilical hernia. Musculoskeletal: No acute or aggressive osseous abnormalities. Moderate to severe facet degenerative change of the lower lumbar spine bilaterally. IMPRESSION: VASCULAR 1. No  evidence of abdominal aortic aneurysm or dissection. 2. Mixed calcified and noncalcified atherosclerotic plaque results in approximately 50% luminal narrowing involving the proximal aspect of the SMA however both the celiac and IMA appear widely patent without hemodynamically significant narrowing. Aortic Atherosclerosis (ICD10-I70.0). 3. Calcified atherosclerotic plaque results in approximately 50% luminal narrowing of the left renal artery however this finding is without associated delayed renal enhancement or asymmetric renal atrophy. NON-VASCULAR 1. No discrete areas of intraluminal contrast extravasation to suggest acute GI bleeding. 2. Scattered colonic diverticulosis without evidence superimposed acute diverticulitis. 3. Large hiatal hernia containing the majority of the stomach, similar to chest CT performed 04/17/2020 and without evidence of volvulus. 4. Suspected hepatic steatosis with mild nodularity hepatic contour as could be seen in the setting of early cirrhotic change. Correlation with LFTs is advised. 5. Redemonstrated fibroid uterus. Electronically Signed   By: Simonne Come M.D.   On: 09/21/2020 13:42    Assessment:   #Suspected upper GI bleeding             -History of Cameron's erosions             -Tachycardic  on presentation- resolved.  BUN 30 / creatinine 0.52             - melena  -hgb descended to 9 on 09/22/20 # obesity # HTN   # Nodular contour to liver on imaging             - outpatient w/u for liver disease. Does not have history             - no signs of portal htn on last egd 03/2020             - lfts wnl; plt nml   # h/o cva             - no anti plt therapy  Plan:  EGD today. Npo since midnight Protonix 40 mg iv bid Monitor H&H with transfusion and resuscitation as per primary team This morning's labs were reviewed Monitor for signs of further gi bleeding Avoid NSAIDs and DVT prophylaxis Outpatient work-up for potential liver disease Supportive care as per  primary team  Further recommendations pending endoscopy  Esophagogastroduodenoscopy with possible biopsy, control of bleeding, polypectomy, and interventions as necessary has been discussed with the patient/patient representative. Informed consent was obtained from the patient/patient representative after explaining the indication, nature, and risks of the procedure including but not limited to death, bleeding, perforation, missed neoplasm/lesions, cardiorespiratory compromise, and reaction to medications. Opportunity for questions was given and appropriate answers were provided. Patient/patient representative has verbalized understanding is amenable to undergoing the procedure.  I personally performed the service.  Management of other medical comorbidities as per primary team  Thank you for allowing [place] to participate in this patient's care. Please don't hesitate to call if any questions or concerns arise.   Jaynie Jake, DO [facility]  Portions of the record may have been created with voice recognition software. Occasional wrong-word or 'sound-a-like' substitutions may have occurred due to the inherent limitations of voice recognition software.  Read the chart carefully and recognize, using context, where substitutions may have occurred.

## 2020-09-22 NOTE — Anesthesia Postprocedure Evaluation (Signed)
Anesthesia Post Note  Patient: Kimberly Mcdonald  Procedure(s) Performed: ESOPHAGOGASTRODUODENOSCOPY (EGD) WITH PROPOFOL  Patient location during evaluation: PACU Anesthesia Type: General Level of consciousness: awake and alert Pain management: pain level controlled Vital Signs Assessment: post-procedure vital signs reviewed and stable Respiratory status: spontaneous breathing, nonlabored ventilation and respiratory function stable Cardiovascular status: blood pressure returned to baseline and stable Postop Assessment: no apparent nausea or vomiting Anesthetic complications: no   No notable events documented.   Last Vitals:  Vitals:   09/22/20 1135 09/22/20 1146  BP: 129/89 125/73  Pulse: 87 93  Resp: 16 20  Temp: (!) 36.3 C   SpO2: 100% 93%    Last Pain:  Vitals:   09/22/20 1146  TempSrc:   PainSc: 0-No pain                 Foye Deer

## 2020-09-22 NOTE — Discharge Summary (Signed)
Physician Discharge Summary  Kimberly Mcdonald WVP:[id number] DOB: June 26, 1965 DOA: 09/21/2020  PCP: System, Provider Not In  Admit date: 09/21/2020 Discharge date: 09/22/2020  Admitted From: Home Disposition: Home  Recommendations for Outpatient Follow-up:  Follow up with PCP in 1 week with repeat CBC/BMP Outpatient follow-up with GI Follow up in ED if symptoms worsen or new appear   Home Health: No Equipment/Devices: None  Discharge Condition: Stable CODE STATUS: Full Diet recommendation: Heart healthy  Brief/Interim Summary: 55 year old female with history of obesity, hepatic steatosis, uterine fibroid, GI bleed, osteoarthritis presented with black stools.  On presentation, hemoglobin was 11.1 and subsequently 10.4.  She was started on IV Protonix.  GI was consulted.  Patient underwent EGD on 09/22/2020.  GI has cleared the patient for discharge on Protonix 40 mg daily.  Outpatient follow-up with PCP and GI.    Discharge Diagnoses:   Possible upper GI bleeding presenting with black stools Acute blood loss anemia -CT angio abdomen/pelvis was negative for any acute GI bleeding but showed colonic diverticulosis without acute diverticulitis; large hiatal hernia and suspected hepatic steatosis -Treated with IV Protonix twice a day.   -Hemoglobin 11.1 on presentation.  Hemoglobin 9 today.  No melena since admission -EGD showed large hiatal hernia along with erosive gastropathy with no bleeding and no stigmata of recent bleeding.  GI is recommending Protonix 40 mg p.o. daily and to avoid aspirin, ibuprofen, naproxen or other nonsteroidal anti-inflammatory drugs indefinitely as possible.  GI has cleared the patient for discharge.  Discharge patient home today.  Outpatient follow-up with PCP and GI.  Repeat CBC to be followed up by PCP.  leukocytosis -Possibly reactive.  Resolved  Dyslipidemia -Continue statin  GERD -Continue PPI as above  Hepatic steatosis -Outpatient follow-up  with GI  Morbid obesity -Outpatient follow-up    Discharge Instructions   Allergies as of 09/22/2020   No Known Allergies      Medication List     TAKE these medications    albuterol 108 (90 Base) MCG/ACT inhaler Commonly known as: VENTOLIN HFA Inhale 2 puffs into the lungs every 6 (six) hours as needed for wheezing or shortness of breath.   amLODipine 10 MG tablet Commonly known as: NORVASC TAKE ONE TABLET BY MOUTH EVERY DAY   atorvastatin 40 MG tablet Commonly known as: LIPITOR TAKE ONE TABLET BY MOUTH EVERY DAY   butalbital-acetaminophen-caffeine 50-325-40 MG tablet Commonly known as: FIORICET Take 1 tablet by mouth every 6 (six) hours as needed for headache.   cholecalciferol 25 MCG (1000 UNIT) tablet Commonly known as: VITAMIN D3 Take 1,000 Units by mouth daily.   ferrous sulfate 325 (65 FE) MG tablet Take 325 mg by mouth daily.   fluticasone 50 MCG/ACT nasal spray Commonly known as: FLONASE PLACE 2 SPRAYS INTO BOTH NOSTRILS EVERY DAY   pantoprazole 40 MG tablet Commonly known as: Protonix Take 1 tablet (40 mg total) by mouth daily.         Follow-up Information     PCP. Schedule an appointment as soon as possible for a visit in 1 week(s).   Why: with repeat cbc/bmp        Jaynie Mungin, DO. Schedule an appointment as soon as possible for a visit in 1 week(s).   Contact information: [address] [facility] [place] [address] [phone]                No Known Allergies  Consultations: GI   Procedures/Studies: CT Angio Abd/Pel W and/or  Wo Contrast  Result Date: 09/21/2020 CLINICAL DATA:  Recurrent GI bleeding. EXAM: CTA ABDOMEN AND PELVIS WITHOUT AND WITH CONTRAST TECHNIQUE: Multidetector CT imaging of the abdomen and pelvis was performed using the standard protocol during bolus administration of intravenous contrast. Multiplanar reconstructed images and MIPs were obtained and reviewed to evaluate the  vascular anatomy. CONTRAST:  100mL OMNIPAQUE IOHEXOL 350 MG/ML SOLN COMPARISON:  CT abdomen and pelvis-04/04/2020; chest CT-04/17/2020 FINDINGS: VASCULAR Aorta: There is no significant atherosclerotic plaque within a mildly tortuous but normal caliber abdominal aorta. No abdominal aortic dissection or periaortic stranding. Celiac: Widely patent without hemodynamically significant narrowing. SMA: There is a moderate amount of eccentric noncalcified atherosclerotic plaque involving the origin and proximal aspect of the SMA (image 64, series 5 which results in at least 50% luminal narrowing (axial image 64, series 5; coronal image 63, series 9). The distal tributaries of the SMA appear widely patent without discrete intraluminal filling defect to suggest distal embolism. Renals: Solitary bilaterally; there is a moderate amount of eccentric predominantly calcified atherosclerotic plaque involving the undersurface of the proximal aspect of the left renal artery which approaches 50% luminal narrowing (coronal image 68, series 9, however this is without associated delayed renal enhancement or asymmetric renal atrophy. The right renal artery is widely patent without hemodynamically significant narrowing. No vessel irregularity to suggest FMD. IMA: Widely patent without a hemodynamically significant narrowing. Inflow: The bilateral common and external iliac arteries are of normal caliber and widely patent without hemodynamically significant narrowing. The bilateral internal iliac arteries are mildly disease though patent and of normal caliber. Proximal Outflow: The bilateral common and imaged portions of the bilateral deep and superficial femoral arteries are of normal caliber and widely patent without a hemodynamically significant stenosis. Veins: The IVC and pelvic venous systems appear widely patent. Review of the MIP images confirms the above findings. _________________________________________________________  NON-VASCULAR Lower chest: Limited visualization of the lower thorax is negative for focal airspace opacity or pleural effusion. Borderline cardiomegaly.  No pericardial effusion. Hepatobiliary: There is mild decreased attenuation hepatic parenchyma suggestive of hepatic steatosis. Potential mild nodularity hepatic contour. No discrete hepatic lesions. Normal appearance of the gallbladder given underdistention. No radiopaque gallstones. No intra or extrahepatic biliary ductal dilatation. No ascites. Pancreas: Normal appearance of the pancreas. Spleen: Normal appearance of the spleen. Adrenals/Urinary Tract: There is symmetric enhancement of the bilateral kidneys. No evidence of nephrolithiasis. No discrete renal lesions. No urinary obstruction or perinephric stranding. Normal appearance of the bilateral adrenal glands. Normal appearance of the urinary bladder given degree of distention. Stomach/Bowel: There are no discrete areas of intraluminal contrast extravasation to suggest acute GI bleeding. Redemonstrated large hiatal hernia containing near the entirety of the stomach, similar to chest CT performed 04/17/2020 and without definitive evidence of volvulus. Scattered colonic diverticulosis without discrete area of superimposed acute diverticulitis. Normal appearance of the terminal ileum and the retrocecal appendix. There is mild gaseous distension of several loops of proximal jejunum, nonspecific though similar to abdominal CT performed 04/04/2020 invests likely normal for this patient. No discrete areas of wall thickening. No evidence of enteric obstruction. No pneumoperitoneum, pneumatosis or portal venous gas. Lymphatic: No bulky retroperitoneal, mesenteric, pelvic or inguinal lymphadenopathy. Reproductive: Redemonstrated approximately 3.7 x 2.5 cm partially exophytic fibroid arising from the posterior aspect of the uterus (image 75, series 7) as well as an approximately 2.5 x 1.5 cm left-sided potential broad  ligament uterine fibroid (image 72, series 7), unchanged. Dystrophic calcifications again noted involving the anterior aspect of the  uterus, potentially a degenerating fibroid. No discrete adnexal lesion. Previously questioned endometrial thickening is not definitely seen on the present examination. No free fluid the pelvic cul-de-sac. Other: Tiny mesenteric fat containing periumbilical hernia. Musculoskeletal: No acute or aggressive osseous abnormalities. Moderate to severe facet degenerative change of the lower lumbar spine bilaterally. IMPRESSION: VASCULAR 1. No evidence of abdominal aortic aneurysm or dissection. 2. Mixed calcified and noncalcified atherosclerotic plaque results in approximately 50% luminal narrowing involving the proximal aspect of the SMA however both the celiac and IMA appear widely patent without hemodynamically significant narrowing. Aortic Atherosclerosis (ICD10-I70.0). 3. Calcified atherosclerotic plaque results in approximately 50% luminal narrowing of the left renal artery however this finding is without associated delayed renal enhancement or asymmetric renal atrophy. NON-VASCULAR 1. No discrete areas of intraluminal contrast extravasation to suggest acute GI bleeding. 2. Scattered colonic diverticulosis without evidence superimposed acute diverticulitis. 3. Large hiatal hernia containing the majority of the stomach, similar to chest CT performed 04/17/2020 and without evidence of volvulus. 4. Suspected hepatic steatosis with mild nodularity hepatic contour as could be seen in the setting of early cirrhotic change. Correlation with LFTs is advised. 5. Redemonstrated fibroid uterus. Electronically Signed   By: Simonne Come M.D.   On: 09/21/2020 13:42    EGD Impression:            - Normal duodenal bulb, first portion of the duodenum                         and second portion of the duodenum.                        - Large hiatal hernia.                        - Erosive gastropathy  with no bleeding and no stigmata                         of recent bleeding.                        - Normal mucosa was found in the entire stomach.                         Biopsied.                        - Esophagogastric landmarks identified. Recommendation:        - Return patient to hospital ward for possible                         discharge same day.                        - Resume previous diet.                        - Continue present medications.                        - Follow an antireflux regimen indefinitely.                        - Use Protonix (pantoprazole) 40 mg PO BID  indefinitely.                        - No aspirin, ibuprofen, naproxen, or other                         non-steroidal anti-inflammatory drugs indefinitely as                         possible.  Subjective: Patient seen and examined at bedside.  Denies any black or bloody stools or vomiting since admission.  Denies worsening abdominal pain.  Discharge Exam: Vitals:   09/22/20 1135 09/22/20 1146  BP: 129/89 125/73  Pulse: 87 93  Resp: 16 20  Temp: (!) 97.3 F (36.3 C)   SpO2: 100% 93%   General exam: Appears calm and comfortable.  Currently on room air. Respiratory system: Bilateral decreased breath sounds at bases Cardiovascular system: S1 & S2 heard, Rate controlled Gastrointestinal system: Abdomen is morbidly obese, nondistended, soft and nontender. Normal bowel sounds heard. Extremities: No cyanosis, clubbing, edema  Central nervous system: Alert and oriented. No focal neurological deficits. Moving extremities Skin: No rashes, lesions or ulcers Psychiatry: Judgement and insight appear normal. Mood & affect appropriate.       The results of significant diagnostics from this hospitalization (including imaging, microbiology, ancillary and laboratory) are listed below for reference.     Microbiology: Recent Results (from the past 240 hour(s))  Resp Panel by RT-PCR  (Flu A&B, Covid) Nasopharyngeal Swab     Status: None   Collection Time: 09/21/20  4:15 PM   Specimen: Nasopharyngeal Swab; Nasopharyngeal(NP) swabs in vial transport medium  Result Value Ref Range Status   SARS Coronavirus 2 by RT PCR NEGATIVE NEGATIVE Final    Comment: (NOTE) SARS-CoV-2 target nucleic acids are NOT DETECTED.  The SARS-CoV-2 RNA is generally detectable in upper respiratory specimens during the acute phase of infection. The lowest concentration of SARS-CoV-2 viral copies this assay can detect is 138 copies/mL. A negative result does not preclude SARS-Cov-2 infection and should not be used as the sole basis for treatment or other patient management decisions. A negative result may occur with  improper specimen collection/handling, submission of specimen other than nasopharyngeal swab, presence of viral mutation(s) within the areas targeted by this assay, and inadequate number of viral copies(<138 copies/mL). A negative result must be combined with clinical observations, patient history, and epidemiological information. The expected result is Negative.  Fact Sheet for Patients:  BloggerCourse.com  Fact Sheet for Healthcare Providers:  SeriousBroker.it  This test is no t yet approved or cleared by the [place] FDA and  has been authorized for detection and/or diagnosis of SARS-CoV-2 by FDA under an Emergency Use Authorization (EUA). This EUA will remain  in effect (meaning this test can be used) for the duration of the COVID-19 declaration under Section 564(b)(1) of the Act, 21 U.S.C.section 360bbb-3(b)(1), unless the authorization is terminated  or revoked sooner.       Influenza A by PCR NEGATIVE NEGATIVE Final   Influenza B by PCR NEGATIVE NEGATIVE Final    Comment: (NOTE) The Xpert Xpress SARS-CoV-2/FLU/RSV plus assay is intended as an aid in the diagnosis of influenza from Nasopharyngeal swab specimens  and should not be used as a sole basis for treatment. Nasal washings and aspirates are unacceptable for Xpert Xpress SARS-CoV-2/FLU/RSV testing.  Fact Sheet for Patients: BloggerCourse.com  Fact Sheet for Healthcare Providers:  SeriousBroker.it  This test is not yet approved or cleared by the [place] and has been authorized for detection and/or diagnosis of SARS-CoV-2 by FDA under an Emergency Use Authorization (EUA). This EUA will remain in effect (meaning this test can be used) for the duration of the COVID-19 declaration under Section 564(b)(1) of the Act, 21 U.S.C. section 360bbb-3(b)(1), unless the authorization is terminated or revoked.  Performed at [facility], [address] [place], [place] [address]      Labs: BNP (last 3 results) No results for input(s): BNP in the last 8760 hours. Basic Metabolic Panel: Recent Labs  Lab 09/21/20 1130 09/22/20 0430  NA 139 141  K 4.5 3.5  CL 110 114*  CO2 23 23  GLUCOSE 133* 101*  BUN 30* 16  CREATININE 0.52 0.53  CALCIUM 9.1 8.4*  MG 1.9  --    Liver Function Tests: Recent Labs  Lab 09/21/20 1130  AST 16  ALT 10  ALKPHOS 88  BILITOT 1.0  PROT 7.2  ALBUMIN 3.4*   No results for input(s): LIPASE, AMYLASE in the last 168 hours. No results for input(s): AMMONIA in the last 168 hours. CBC: Recent Labs  Lab 09/21/20 1130 09/21/20 1503 09/22/20 0430  WBC 11.5*  --  8.6  HGB 11.1* 10.4* 9.0*  HCT 34.8* 34.5* 28.3*  MCV 90.2  --  91.0  PLT 368  --  289   Cardiac Enzymes: No results for input(s): CKTOTAL, CKMB, CKMBINDEX, TROPONINI in the last 168 hours. BNP: Invalid input(s): POCBNP CBG: No results for input(s): GLUCAP in the last 168 hours. D-Dimer No results for input(s): DDIMER in the last 72 hours. Hgb A1c No results for input(s): HGBA1C in the last 72 hours. Lipid Profile No results for input(s): CHOL, HDL, LDLCALC, TRIG,  CHOLHDL, LDLDIRECT in the last 72 hours. Thyroid function studies No results for input(s): TSH, T4TOTAL, T3FREE, THYROIDAB in the last 72 hours.  Invalid input(s): FREET3 Anemia work up No results for input(s): VITAMINB12, FOLATE, FERRITIN, TIBC, IRON, RETICCTPCT in the last 72 hours. Urinalysis    Component Value Date/Time   COLORURINE YELLOW (A) 04/04/2020 1459   APPEARANCEUR HAZY (A) 04/04/2020 1459   LABSPEC 1.023 04/04/2020 1459   PHURINE 6.0 04/04/2020 1459   GLUCOSEU NEGATIVE 04/04/2020 1459   HGBUR NEGATIVE 04/04/2020 1459   BILIRUBINUR NEGATIVE 04/04/2020 1459   KETONESUR NEGATIVE 04/04/2020 1459   PROTEINUR NEGATIVE 04/04/2020 1459   NITRITE NEGATIVE 04/04/2020 1459   LEUKOCYTESUR NEGATIVE 04/04/2020 1459   Sepsis Labs Invalid input(s): PROCALCITONIN,  WBC,  LACTICIDVEN Microbiology Recent Results (from the past 240 hour(s))  Resp Panel by RT-PCR (Flu A&B, Covid) Nasopharyngeal Swab     Status: None   Collection Time: 09/21/20  4:15 PM   Specimen: Nasopharyngeal Swab; Nasopharyngeal(NP) swabs in vial transport medium  Result Value Ref Range Status   SARS Coronavirus 2 by RT PCR NEGATIVE NEGATIVE Final    Comment: (NOTE) SARS-CoV-2 target nucleic acids are NOT DETECTED.  The SARS-CoV-2 RNA is generally detectable in upper respiratory specimens during the acute phase of infection. The lowest concentration of SARS-CoV-2 viral copies this assay can detect is 138 copies/mL. A negative result does not preclude SARS-Cov-2 infection and should not be used as the sole basis for treatment or other patient management decisions. A negative result may occur with  improper specimen collection/handling, submission of specimen other than nasopharyngeal swab, presence of viral mutation(s) within the areas targeted by this assay, and inadequate number  of viral copies(<138 copies/mL). A negative result must be combined with clinical observations, patient history, and  epidemiological information. The expected result is Negative.  Fact Sheet for Patients:  BloggerCourse.com  Fact Sheet for Healthcare Providers:  SeriousBroker.it  This test is no t yet approved or cleared by the [place] FDA and  has been authorized for detection and/or diagnosis of SARS-CoV-2 by FDA under an Emergency Use Authorization (EUA). This EUA will remain  in effect (meaning this test can be used) for the duration of the COVID-19 declaration under Section 564(b)(1) of the Act, 21 U.S.C.section 360bbb-3(b)(1), unless the authorization is terminated  or revoked sooner.       Influenza A by PCR NEGATIVE NEGATIVE Final   Influenza B by PCR NEGATIVE NEGATIVE Final    Comment: (NOTE) The Xpert Xpress SARS-CoV-2/FLU/RSV plus assay is intended as an aid in the diagnosis of influenza from Nasopharyngeal swab specimens and should not be used as a sole basis for treatment. Nasal washings and aspirates are unacceptable for Xpert Xpress SARS-CoV-2/FLU/RSV testing.  Fact Sheet for Patients: BloggerCourse.com  Fact Sheet for Healthcare Providers: SeriousBroker.it  This test is not yet approved or cleared by the [place] FDA and has been authorized for detection and/or diagnosis of SARS-CoV-2 by FDA under an Emergency Use Authorization (EUA). This EUA will remain in effect (meaning this test can be used) for the duration of the COVID-19 declaration under Section 564(b)(1) of the Act, 21 U.S.C. section 360bbb-3(b)(1), unless the authorization is terminated or revoked.  Performed at [facility], [address], [place], [place] [address]      Time coordinating discharge: 35 minutes  SIGNED:   Glade Lloyd, MD  [facility] 09/22/2020, 1:25 PM

## 2020-09-24 ENCOUNTER — Other Ambulatory Visit: Payer: Self-pay

## 2020-09-24 ENCOUNTER — Encounter: Payer: Self-pay | Admitting: Gastroenterology

## 2020-09-25 LAB — SURGICAL PATHOLOGY

## 2020-11-01 ENCOUNTER — Other Ambulatory Visit: Payer: Self-pay

## 2020-11-01 ENCOUNTER — Ambulatory Visit
Admission: RE | Admit: 2020-11-01 | Discharge: 2020-11-01 | Disposition: A | Discharge status: Home / Self Care | Payer: 59 | Source: Ambulatory Visit | Attending: Oncology | Admitting: Oncology

## 2020-11-01 DIAGNOSIS — N6310 Unspecified lump in the right breast, unspecified quadrant: Secondary | ICD-10-CM

## 2020-11-01 DIAGNOSIS — R920 Mammographic microcalcification found on diagnostic imaging of breast: Secondary | ICD-10-CM

## 2020-11-01 DIAGNOSIS — N63 Unspecified lump in unspecified breast: Secondary | ICD-10-CM | POA: Diagnosis present

## 2020-11-01 NOTE — Progress Notes (Signed)
Orders put in for recommended  left breast calcifications biopsy . Message sent to Madelynn Done to notify of biopsy date.  Will schedule patient for 6 month follow-up mammogram right breast mass as ordered.

## 2020-11-09 ENCOUNTER — Ambulatory Visit
Admission: RE | Admit: 2020-11-09 | Discharge: 2020-11-09 | Disposition: A | Discharge status: Home / Self Care | Payer: Medicaid Other | Source: Ambulatory Visit | Attending: Oncology | Admitting: Oncology

## 2020-11-09 ENCOUNTER — Other Ambulatory Visit: Payer: Self-pay

## 2020-11-09 ENCOUNTER — Ambulatory Visit
Admission: RE | Admit: 2020-11-09 | Discharge: 2020-11-09 | Disposition: A | Discharge status: Home / Self Care | Payer: Self-pay | Source: Ambulatory Visit | Attending: Oncology | Admitting: Oncology

## 2020-11-09 DIAGNOSIS — R920 Mammographic microcalcification found on diagnostic imaging of breast: Secondary | ICD-10-CM | POA: Insufficient documentation

## 2020-11-09 HISTORY — PX: BREAST BIOPSY: SHX20

## 2020-11-12 ENCOUNTER — Other Ambulatory Visit: Payer: Self-pay

## 2020-11-12 DIAGNOSIS — N6310 Unspecified lump in the right breast, unspecified quadrant: Secondary | ICD-10-CM

## 2020-11-12 LAB — SURGICAL PATHOLOGY

## 2020-11-16 ENCOUNTER — Encounter: Payer: Self-pay | Admitting: Emergency Medicine

## 2020-11-16 ENCOUNTER — Other Ambulatory Visit: Payer: Self-pay

## 2020-11-16 ENCOUNTER — Emergency Department
Admission: EM | Admit: 2020-11-16 | Discharge: 2020-11-16 | Disposition: A | Discharge status: Home / Self Care | Payer: 59 | Attending: Student in an Organized Health Care Education/Training Program | Admitting: Student in an Organized Health Care Education/Training Program

## 2020-11-16 DIAGNOSIS — Z79899 Other long term (current) drug therapy: Secondary | ICD-10-CM | POA: Diagnosis not present

## 2020-11-16 DIAGNOSIS — I10 Essential (primary) hypertension: Secondary | ICD-10-CM | POA: Diagnosis not present

## 2020-11-16 DIAGNOSIS — X58XXXA Exposure to other specified factors, initial encounter: Secondary | ICD-10-CM | POA: Diagnosis not present

## 2020-11-16 DIAGNOSIS — S39012A Strain of muscle, fascia and tendon of lower back, initial encounter: Secondary | ICD-10-CM | POA: Insufficient documentation

## 2020-11-16 DIAGNOSIS — T148XXA Other injury of unspecified body region, initial encounter: Secondary | ICD-10-CM

## 2020-11-16 DIAGNOSIS — S3992XA Unspecified injury of lower back, initial encounter: Secondary | ICD-10-CM | POA: Diagnosis present

## 2020-11-16 DIAGNOSIS — Z87891 Personal history of nicotine dependence: Secondary | ICD-10-CM | POA: Insufficient documentation

## 2020-11-16 LAB — URINALYSIS, ROUTINE W REFLEX MICROSCOPIC
Bilirubin Urine: NEGATIVE
Glucose, UA: NEGATIVE mg/dL
Hgb urine dipstick: NEGATIVE
Ketones, ur: NEGATIVE mg/dL
Leukocytes,Ua: NEGATIVE
Nitrite: NEGATIVE
Protein, ur: NEGATIVE mg/dL
Specific Gravity, Urine: 1.013 (ref 1.005–1.030)
pH: 7 (ref 5.0–8.0)

## 2020-11-16 MED ORDER — HYDROCODONE-ACETAMINOPHEN 5-325 MG PO TABS: 1.0000 | ORAL_TABLET | Freq: Four times a day (QID) | ORAL | 0 refills | Status: DC | PRN

## 2020-11-16 MED ORDER — HYDROCODONE-ACETAMINOPHEN 5-325 MG PO TABS
1.0000 | ORAL_TABLET | Freq: Once | ORAL | Status: AC
  Administered 2020-11-16: 1 via ORAL
  Filled 2020-11-16: qty 1

## 2020-11-16 MED ORDER — METHOCARBAMOL 500 MG PO TABS
1000.0000 mg | ORAL_TABLET | Freq: Once | ORAL | Status: AC
  Administered 2020-11-16: 1000 mg via ORAL
  Filled 2020-11-16: qty 2

## 2020-11-16 MED ORDER — METHOCARBAMOL 500 MG PO TABS: ORAL_TABLET | ORAL | 0 refills | Status: DC

## 2020-11-16 NOTE — Discharge Instructions (Addendum)
Follow-up with your primary care provider if any continued problems or concerns.  You may use ice or heat to your back as needed for discomfort.  Take medication only as directed.  The pain medication hydrocodone-acetaminophen can cause drowsiness in addition to the muscle relaxant methocarbamol.  You may also use the over-the-counter topical lidocaine patches to help control a pinpoint area of pain.  Do not drive or operate machinery while taking the medications as they could cause drowsiness.

## 2020-11-16 NOTE — ED Notes (Signed)
See triage note  presents with right flank /lower back pain  states pain is non radiating and started yesterday

## 2020-11-16 NOTE — ED Triage Notes (Signed)
Pt comes into the ED via POV c/o right side low back pain.  Pt denies any known injury, denies any urinary symptoms, or problems with defecation.  Pt states the pain started yesterday when sitting on the toilet to have a bowel movement.  Pt denies any back history.  Pt states the pain is only there with movement.  Pt in NAD at this time with even and unlabored respirations.

## 2020-11-16 NOTE — ED Provider Notes (Signed)
[facility] Emergency Department Provider Note  ____________________________________________   Event Date/Time   First MD Initiated Contact with Patient 11/16/20 1116     (approximate)  I have reviewed the triage vital signs and the nursing notes.   HISTORY  Chief Complaint Back Pain   HPI Kimberly Mcdonald is a 55 y.o. female presents to the ED with complaint of lower right back pain that began yesterday while she was sitting on the toilet.  She denies any fall or previous back injuries.  She states that pain is increased with range of motion.  She has taken some over-the-counter medication without any relief.  She denies any urinary symptoms, history of kidney stones, incontinence of bowel or bladder.  Patient has continued to be ambulatory without assistance at this time.  She rates her pain as 7 out of 10.      Past Medical History:  Diagnosis Date   Allergy    seasonal   Anemia due to blood loss 05/08/2019   Arthritis    knees and back   Cerebrovascular accident (CVA) (HCC)    HTN (hypertension)    Hyperlipidemia    Hypertension    Stroke [facility])    Substance abuse (HCC)    Marijuana smoker x35 yrs    Patient Active Problem List   Diagnosis Date Noted   Melena 09/21/2020   Hyperlipidemia 09/21/2020   Hepatic steatosis 09/21/2020   Neutrophilic leukocytosis 09/21/2020   Leukocytosis 09/21/2020   Heart rate fast 05/15/2020   Acute upper GI bleed 04/05/2020   Acute upper GI bleeding 04/04/2020   Obesity, Class III, BMI 40-49.9 (morbid obesity) (HCC) 04/04/2020   Bilateral knee pain 03/28/2020   Sinusitis 02/29/2020   Carotid stenosis 06/07/2019   Cerebrovascular accident (CVA) (HCC)    Right arm numbness 05/08/2019   GIB (gastrointestinal bleeding) 05/08/2019   Symptomatic anemia 05/08/2019   Tobacco abuse 05/08/2019   Hypokalemia 05/08/2019   Hypertension 09/03/2017   Arthritis 09/03/2017   Healthcare maintenance 09/03/2017    Low back pain 09/03/2017    Past Surgical History:  Procedure Laterality Date   ABDOMINAL SURGERY     BREAST BIOPSY Left 11/09/2020   stereo bx 11/09/2020-"coil" clip path pending   COLONOSCOPY WITH PROPOFOL N/A 04/05/2020   Procedure: COLONOSCOPY WITH PROPOFOL;  Surgeon: Toledo, Boykin Nearing, MD;  Location: [facility] ENDOSCOPY;  Service: Gastroenterology;  Laterality: N/A;   ENDARTERECTOMY Left 05/13/2019   Procedure: ENDARTERECTOMY CAROTID;  Surgeon: Renford Dills, MD;  Location: [facility] ORS;  Service: Vascular;  Laterality: Left;   ESOPHAGOGASTRODUODENOSCOPY (EGD) WITH PROPOFOL N/A 04/05/2020   Procedure: ESOPHAGOGASTRODUODENOSCOPY (EGD) WITH PROPOFOL;  Surgeon: Toledo, Boykin Nearing, MD;  Location: [facility] ENDOSCOPY;  Service: Gastroenterology;  Laterality: N/A;   ESOPHAGOGASTRODUODENOSCOPY (EGD) WITH PROPOFOL N/A 09/22/2020   Procedure: ESOPHAGOGASTRODUODENOSCOPY (EGD) WITH PROPOFOL;  Surgeon: Jaynie Albea, DO;  Location: [facility] ENDOSCOPY;  Service: Gastroenterology;  Laterality: N/A;   TUBAL LIGATION      Prior to Admission medications   Medication Sig Start Date End Date Taking? Authorizing Provider  HYDROcodone-acetaminophen (NORCO/VICODIN) 5-325 MG tablet Take 1 tablet by mouth every 6 (six) hours as needed for moderate pain. 11/16/20 11/16/21 Yes Kashia Brossard L, PA-C  methocarbamol (ROBAXIN) 500 MG tablet 1 or 2 tablets every 6 hours as needed for muscle spasms. 11/16/20  Yes Bridget Hartshorn L, PA-C  albuterol (VENTOLIN HFA) 108 (90 Base) MCG/ACT inhaler Inhale 2 puffs into the lungs every 6 (six) hours as needed for wheezing  or shortness of breath. 04/26/20   Iloabachie, Chioma E, NP  amLODipine (NORVASC) 10 MG tablet TAKE ONE TABLET BY MOUTH EVERY DAY 02/29/20 02/28/21  Iloabachie, Chioma E, NP  atorvastatin (LIPITOR) 40 MG tablet TAKE ONE TABLET BY MOUTH EVERY DAY 06/13/20 05/28/21  Iloabachie, Chioma E, NP  butalbital-acetaminophen-caffeine (FIORICET) 50-325-40 MG tablet Take 1 tablet by  mouth every 6 (six) hours as needed for headache. 04/08/20 04/08/21  Lucy Chris, PA  cholecalciferol (VITAMIN D3) 25 MCG (1000 UNIT) tablet Take 1,000 Units by mouth daily.    [provider]  ferrous sulfate 325 (65 FE) MG tablet Take 325 mg by mouth daily.    [provider]  fluticasone (FLONASE) 50 MCG/ACT nasal spray PLACE 2 SPRAYS INTO BOTH NOSTRILS EVERY DAY 02/29/20 02/28/21  Iloabachie, Chioma E, NP  pantoprazole (PROTONIX) 40 MG tablet Take 1 tablet (40 mg total) by mouth once daily. 09/22/20 11/09/20  Glade Lloyd, MD    Allergies Patient has no known allergies.  Family History  Problem Relation Age of Onset   Diabetes Mother    Hypertension Mother    Cancer Father        leukemia   Alcohol abuse Sister    Breast cancer Neg Hx     Social History Social History   Tobacco Use   Smoking status: Former    Packs/day: 1.00    Years: 38.00    Pack years: 38.00    Types: Cigarettes    Quit date: 04/03/2020    Years since quitting: 0.6   Smokeless tobacco: Never  Vaping Use   Vaping Use: Never used  Substance Use Topics   Alcohol use: Not Currently    Comment: have not been drinking since surgery 04/2019   Drug use: Yes    Frequency: 3.0 times per week    Types: Marijuana    Comment: unsure exactly of use    Review of Systems Constitutional: No fever/chills Eyes: No visual changes. ENT: No symptoms. Cardiovascular: Denies chest pain. Respiratory: Denies shortness of breath. Gastrointestinal: No abdominal pain.  No nausea, no vomiting.  No diarrhea.  No constipation. Genitourinary: Negative for dysuria.  Negative for stones. Musculoskeletal: Positive for right-sided low back pain. Skin: Negative for rash. Neurological: Negative for headaches, focal weakness or numbness. ____________________________________________   PHYSICAL EXAM:  VITAL SIGNS: ED Triage Vitals  Enc Vitals Group     BP 11/16/20 0835 (!) 145/100     Pulse Rate  11/16/20 0835 88     Resp 11/16/20 0835 18     Temp 11/16/20 0835 97.7 F (36.5 C)     Temp Source 11/16/20 0835 Oral     SpO2 11/16/20 0835 97 %     Weight 11/16/20 0836 229 lb 15 oz (104.3 kg)     Height 11/16/20 0836 5\' 3"  (1.6 m)     Head Circumference --      Peak Flow --      Pain Score 11/16/20 0836 7     Pain Loc --      Pain Edu? --      Excl. in GC? --     Constitutional: Alert and oriented. Well appearing and in no acute distress. Eyes: Conjunctivae are normal.  Head: Atraumatic. Neck: No stridor.   Cardiovascular: Normal rate, regular rhythm. Grossly normal heart sounds.  Good peripheral circulation. Respiratory: Normal respiratory effort.  No retractions. Lungs CTAB. Gastrointestinal: Soft and nontender. No distention.  No CVA tenderness.  Bowel sounds  normoactive x4 quadrants. Musculoskeletal: No point tenderness on palpation of the thoracic or lumbar spine.  There is tenderness to the musculature of the lateral back.  Range of motion is decreased due to spasms.  Patient is able to move lower extremities.  Good muscle strength bilaterally and equal at 5/5. Neurologic:  Normal speech and language.  Reflexes were 2+ bilaterally.  No gross focal neurologic deficits are appreciated. No gait instability. Skin:  Skin is warm, dry and intact. No rash noted. Psychiatric: Mood and affect are normal. Speech and behavior are normal.  ____________________________________________   LABS (all labs ordered are listed, but only abnormal results are displayed)  Labs Reviewed  URINALYSIS, ROUTINE W REFLEX MICROSCOPIC - Abnormal; Notable for the following components:      Result Value   Color, Urine YELLOW (*)    APPearance HAZY (*)    All other components within normal limits   ____________________________________________  PROCEDURES  Procedure(s) performed (including Critical Care):  Procedures   ____________________________________________   INITIAL IMPRESSION /  ASSESSMENT AND PLAN / ED COURSE  As part of my medical decision making, I reviewed the following data within the electronic MEDICAL RECORD NUMBER Notes from prior ED visits and [place] Controlled Substance Database  55 year old female presents to the ED with complaint of right-sided low back pain that began suddenly while she was sitting on the toilet having a bowel movement.  She denies any direct trauma to her back and states that movement increases her pain.  On exam findings were consistent with muscle spasms in the right lumbar area laterally.  Straight leg raises were approximately 70 degrees and increased her pain to the right.  Good muscle strength bilaterally at 5/5.  No point tenderness is noted on the thoracic or lumbar spine.  Patient was given methocarbamol 1000 mg and Norco while in the ED and began feeling much better and was able to ambulate without any assistance.  A prescription for the same was sent to the pharmacy.  She is encouraged to use ice or heat to her back as needed for discomfort.  She will follow-up with her PCP if any continued problems.  If any severe worsening of her symptoms she will return to the emergency department.   ____________________________________________   FINAL CLINICAL IMPRESSION(S) / ED DIAGNOSES  Final diagnoses:  Musculoskeletal strain     ED Discharge Orders          Ordered    HYDROcodone-acetaminophen (NORCO/VICODIN) 5-325 MG tablet  Every 6 hours PRN        11/16/20 1249    methocarbamol (ROBAXIN) 500 MG tablet        11/16/20 1249             Note:  This document was prepared using Dragon voice recognition software and may include unintentional dictation errors.    Tommi Rumps, PA-C 11/16/20 1454    Willy Eddy, MD 11/16/20 1459

## 2020-11-22 ENCOUNTER — Telehealth: Payer: Self-pay | Admitting: Pharmacy Technician

## 2020-11-22 NOTE — Telephone Encounter (Signed)
Patient has [employer] with prescription drug coverage.  No longer meets [facility]'s eligibility criteria.  Patient notified.  Kimberly Mcdonald Care Manager [facility]   [address] [place], [place]  [address]  November 22, 2020    [facility] [address] [place], [place]  [address]  Dear Kimberly Mcdonald:  This is to inform you that you are no longer eligible to receive medication assistance at [facility].  The reason(s) are:    _____Your total gross monthly household income exceeds 250% of the Federal Poverty Level.   _____Tangible assets (savings, checking, stocks/bonds, pension, retirement, etc.) exceeds our limit  _____You are eligible to receive benefits from [facility], [facility] or HIV Medication            Assistance program _____You are eligible to receive benefits from a Medicare Part "D" plan __X__You have prescription insurance with [employer] _____You are not an [facility] resident _____Failure to provide all requested documentation (proof of income information for 2022., and/or Patient Intake Application, DOH Attestation, Contract, etc).    We regret that we are unable to help you at this time.  If your prescription coverage is terminated, please contact [facility], so that we may reassess your eligibility for our program.  If you have questions, we may be contacted at [phone].  Thank you,  [facility]

## 2020-11-28 ENCOUNTER — Other Ambulatory Visit: Payer: Self-pay

## 2020-11-29 ENCOUNTER — Ambulatory Visit: Payer: Medicaid Other | Admitting: Gerontology

## 2020-12-12 ENCOUNTER — Ambulatory Visit: Payer: Medicaid Other | Admitting: Gerontology

## 2020-12-18 ENCOUNTER — Other Ambulatory Visit: Payer: Self-pay | Admitting: Gerontology

## 2020-12-18 ENCOUNTER — Other Ambulatory Visit: Payer: Self-pay | Admitting: Emergency Medicine

## 2020-12-18 ENCOUNTER — Ambulatory Visit: Payer: Medicaid Other | Admitting: Gerontology

## 2020-12-18 DIAGNOSIS — R300 Dysuria: Secondary | ICD-10-CM

## 2020-12-18 NOTE — Progress Notes (Signed)
Established Patient Office Visit  Subjective:  Patient ID: Kimberly Mcdonald, female    DOB: [birth year]  Age: 55 y.o. MRN: [id number]  CC:  Chief Complaint  Patient presents with   Labs Only    HPI Kimberly Mcdonald presents for lab collection.  Past Medical History:  Diagnosis Date   Allergy    seasonal   Anemia due to blood loss 05/08/2019   Arthritis    knees and back   Cerebrovascular accident (CVA) ([place])    HTN (hypertension)    Hyperlipidemia    Hypertension    Stroke [facility])    Substance abuse ([place])    Marijuana smoker x35 yrs    Past Surgical History:  Procedure Laterality Date   ABDOMINAL SURGERY     BREAST BIOPSY Left 11/09/2020   stereo bx 11/09/2020-"coil" clip path pending   COLONOSCOPY WITH PROPOFOL N/A 04/05/2020   Procedure: COLONOSCOPY WITH PROPOFOL;  Surgeon: Toledo, Benay Pike, MD;  Location: [facility] ENDOSCOPY;  Service: Gastroenterology;  Laterality: N/A;   ENDARTERECTOMY Left 05/13/2019   Procedure: ENDARTERECTOMY CAROTID;  Surgeon: Katha Cabal, MD;  Location: [facility] ORS;  Service: Vascular;  Laterality: Left;   ESOPHAGOGASTRODUODENOSCOPY (EGD) WITH PROPOFOL N/A 04/05/2020   Procedure: ESOPHAGOGASTRODUODENOSCOPY (EGD) WITH PROPOFOL;  Surgeon: Toledo, Benay Pike, MD;  Location: [facility] ENDOSCOPY;  Service: Gastroenterology;  Laterality: N/A;   ESOPHAGOGASTRODUODENOSCOPY (EGD) WITH PROPOFOL N/A 09/22/2020   Procedure: ESOPHAGOGASTRODUODENOSCOPY (EGD) WITH PROPOFOL;  Surgeon: Annamaria Helling, DO;  Location[facility] ENDOSCOPY;  Service: Gastroenterology;  Laterality: N/A;   TUBAL LIGATION      Family History  Problem Relation Age of Onset   Diabetes Mother    Hypertension Mother    Cancer Father        leukemia   Alcohol abuse Sister    Breast cancer Neg Hx     Social History   Socioeconomic History   Marital status: Single    Spouse name: Not on file   Number of children: 3   Years of education: Not on file   Highest education  level: Not on file  Occupational History   Occupation: unemployed  Tobacco Use   Smoking status: Former    Packs/day: 1.00    Years: 38.00    Pack years: 38.00    Types: Cigarettes    Quit date: 04/03/2020    Years since quitting: 0.7   Smokeless tobacco: Never  Vaping Use   Vaping Use: Never used  Substance and Sexual Activity   Alcohol use: Not Currently    Comment: have not been drinking since surgery 04/2019   Drug use: Yes    Frequency: 3.0 times per week    Types: Marijuana    Comment: unsure exactly of use   Sexual activity: Not Currently    Birth control/protection: None  Other Topics Concern   Not on file  Social History Narrative   ** Merged History Encounter **       Right handed Drinks caffeine One story home   Social Determinants of Health   Financial Resource Strain: Not on file  Food Insecurity: No Food Insecurity   Worried About Charity fundraiser in the Last Year: Never true   [place] in the Last Year: Never true  Transportation Needs: No Transportation Needs   Lack of Transportation (Medical): No   Lack of Transportation (Non-Medical): No  Physical Activity: Not on file  Stress: Not on file  Social Connections: Not on file  Intimate Partner Violence: Not on file    Outpatient Medications Prior to Visit  Medication Sig Dispense Refill   albuterol (VENTOLIN HFA) 108 (90 Base) MCG/ACT inhaler Inhale 2 puffs into the lungs every 6 (six) hours as needed for wheezing or shortness of breath. 6.7 g 0   amLODipine (NORVASC) 10 MG tablet TAKE ONE TABLET BY MOUTH EVERY DAY 90 tablet 1   atorvastatin (LIPITOR) 40 MG tablet TAKE ONE TABLET BY MOUTH EVERY DAY 90 tablet 0   butalbital-acetaminophen-caffeine (FIORICET) 50-325-40 MG tablet Take 1 tablet by mouth every 6 (six) hours as needed for headache. 20 tablet 0   cholecalciferol (VITAMIN D3) 25 MCG (1000 UNIT) tablet Take 1,000 Units by mouth daily.     ferrous sulfate 325 (65 FE) MG tablet Take 325  mg by mouth daily.     fluticasone (FLONASE) 50 MCG/ACT nasal spray PLACE 2 SPRAYS INTO BOTH NOSTRILS EVERY DAY 16 g 1   HYDROcodone-acetaminophen (NORCO/VICODIN) 5-325 MG tablet Take 1 tablet by mouth every 6 (six) hours as needed for moderate pain. 15 tablet 0   methocarbamol (ROBAXIN) 500 MG tablet 1 or 2 tablets every 6 hours as needed for muscle spasms. 15 tablet 0   pantoprazole (PROTONIX) 40 MG tablet Take 1 tablet (40 mg total) by mouth once daily. 30 tablet 0   No facility-administered medications prior to visit.    No Known Allergies  ROS Review of Systems    Objective:    Physical Exam  LMP 10/01/2016 (Approximate)  Wt Readings from Last 3 Encounters:  11/16/20 229 lb 15 oz (104.3 kg)  09/21/20 230 lb (104.3 kg)  06/13/20 243 lb 12.8 oz (110.6 kg)     Health Maintenance Due  Topic Date Due   COVID-19 Vaccine (1) Never done   Pneumococcal Vaccine 75-59 Years old (1 - PCV) Never done   Hepatitis C Screening  Never done   TETANUS/TDAP  Never done   Zoster Vaccines- Shingrix (1 of 2) Never done   INFLUENZA VACCINE  Never done    There are no preventive care reminders to display for this patient.  Lab Results  Component Value Date   TSH 1.700 09/03/2017   Lab Results  Component Value Date   WBC 8.6 09/22/2020   HGB 9.0 (L) 09/22/2020   HCT 28.3 (L) 09/22/2020   MCV 91.0 09/22/2020   PLT 289 09/22/2020   Lab Results  Component Value Date   NA 141 09/22/2020   K 3.5 09/22/2020   CO2 23 09/22/2020   GLUCOSE 101 (H) 09/22/2020   BUN 16 09/22/2020   CREATININE 0.53 09/22/2020   BILITOT 1.0 09/21/2020   ALKPHOS 88 09/21/2020   AST 16 09/21/2020   ALT 10 09/21/2020   PROT 7.2 09/21/2020   ALBUMIN 3.4 (L) 09/21/2020   CALCIUM 8.4 (L) 09/22/2020   ANIONGAP 4 (L) 09/22/2020   EGFR 105 05/02/2020   Lab Results  Component Value Date   CHOL 162 06/16/2019   Lab Results  Component Value Date   HDL 53 06/16/2019   Lab Results  Component Value Date    LDLCALC 86 06/16/2019   Lab Results  Component Value Date   TRIG 131 06/16/2019   Lab Results  Component Value Date   CHOLHDL 3.1 06/16/2019   Lab Results  Component Value Date   HGBA1C 5.2 05/09/2019      Assessment & Plan:   Problem List Items Addressed This Visit   None Visit Diagnoses  Dysuria    -  Primary   Relevant Orders   UA/M w/rflx Culture, Routine       No orders of the defined types were placed in this encounter.   Follow-up: No follow-ups on file.    Emelynn Rance Jerold Coombe, NP

## 2020-12-18 NOTE — Progress Notes (Deleted)
Patient collected urine specimen for culture per Lanora Manis, NP.

## 2020-12-20 ENCOUNTER — Ambulatory Visit: Payer: Medicaid Other | Admitting: Gerontology

## 2020-12-23 LAB — UA/M W/RFLX CULTURE, ROUTINE
Bilirubin, UA: NEGATIVE
Glucose, UA: NEGATIVE
Ketones, UA: NEGATIVE
Nitrite, UA: NEGATIVE
Protein,UA: NEGATIVE
RBC, UA: NEGATIVE
Specific Gravity, UA: 1.019 (ref 1.005–1.030)
Urobilinogen, Ur: 0.2 mg/dL (ref 0.2–1.0)
pH, UA: 6.5 (ref 5.0–7.5)

## 2020-12-23 LAB — MICROSCOPIC EXAMINATION
Bacteria, UA: NONE SEEN
Casts: NONE SEEN /lpf

## 2020-12-23 LAB — URINE CULTURE, REFLEX

## 2020-12-25 ENCOUNTER — Ambulatory Visit: Payer: Medicaid Other | Admitting: Gerontology

## 2020-12-25 ENCOUNTER — Encounter: Payer: Self-pay | Admitting: Gerontology

## 2020-12-25 ENCOUNTER — Other Ambulatory Visit: Payer: Self-pay

## 2020-12-25 VITALS — BP 139/89 | HR 94 | Temp 97.9°F | Resp 18 | Ht 63.0 in | Wt 239.5 lb

## 2020-12-25 DIAGNOSIS — K449 Diaphragmatic hernia without obstruction or gangrene: Secondary | ICD-10-CM

## 2020-12-25 DIAGNOSIS — E785 Hyperlipidemia, unspecified: Secondary | ICD-10-CM

## 2020-12-25 DIAGNOSIS — E1169 Type 2 diabetes mellitus with other specified complication: Secondary | ICD-10-CM

## 2020-12-25 DIAGNOSIS — Z0000 Encounter for general adult medical examination without abnormal findings: Secondary | ICD-10-CM

## 2020-12-25 DIAGNOSIS — M79644 Pain in right finger(s): Secondary | ICD-10-CM | POA: Insufficient documentation

## 2020-12-25 DIAGNOSIS — R32 Unspecified urinary incontinence: Secondary | ICD-10-CM

## 2020-12-25 DIAGNOSIS — I10 Essential (primary) hypertension: Secondary | ICD-10-CM

## 2020-12-25 DIAGNOSIS — Z8379 Family history of other diseases of the digestive system: Secondary | ICD-10-CM

## 2020-12-25 MED ORDER — PANTOPRAZOLE SODIUM 40 MG PO TBEC
40.0000 mg | DELAYED_RELEASE_TABLET | Freq: Every day | ORAL | 3 refills | Status: DC
  Filled 2020-12-25: qty 30, 30d supply, fill #0

## 2020-12-25 MED ORDER — ATORVASTATIN CALCIUM 40 MG PO TABS
ORAL_TABLET | Freq: Every day | ORAL | 1 refills | Status: DC
  Filled 2020-12-25: qty 90, 90d supply, fill #0

## 2020-12-25 MED ORDER — AMLODIPINE BESYLATE 10 MG PO TABS
ORAL_TABLET | Freq: Every day | ORAL | 1 refills | Status: DC
  Filled 2020-12-25: qty 90, 90d supply, fill #0

## 2020-12-25 NOTE — Progress Notes (Signed)
Established Patient Office Visit  Subjective:  Patient ID: Kimberly Mcdonald, female    DOB: 04/06/1965  Age: 55 y.o. MRN: [id number]  CC:  Chief Complaint  Patient presents with   Follow-up    HPI Breanda Mcdonald is a 55 year old female who has history of allergy, anemia arthritis, hypertension, hyperlipidemia, stroke presents for routine follow-up visit. She was seen at the ED on 11/16/20 for back pain, she states that symptoms has resolved. She had Mammogram and ultrasound done on 11/01/2020 and it showed Mammographically stable appearance of a 15 mm group of coarse heterogeneous calcifications in association with a focal asymmetry in the LEFT upper inner breast at middle depth. While of low suspicion for malignancy, this remains indeterminate. Recommend stereotactic guided biopsy for definitive characterization. 2. Stable probably benign RIGHT breast mass in the RIGHT upper inner breast. Recommend follow-up mammogram with ultrasound as deemed necessary in 6 months. This will establish 1 year of definitive stability. Left Breast stereotactic core needle biopsy was done and it showed Stereotactic-guided biopsy of indeterminate left breast microcalcifications. No apparent complications. She states that she continues experiences stress urinary incontinence, denies dysuria, urinary frequency/urgency, flank/pelvic pain.  She had pelvic/ abdominal  CT scan done  09/21/20 and it showed Large hiatal hernia containing the majority of the stomach, similar to chest CT performed 04/17/2020 and without evidence of volvulus.She denies abdominal pain, but request surgery consult. She c/o non traumatic constant dull pain to her right thumb that started a month ago. She states that the interphalangeal joint does not bend and it's painful. She denies any aggravating or relieving factor.  Overall, she states that she is doing well and offers no further complaint.  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 yrs    Past Surgical History:  Procedure Laterality Date   ABDOMINAL SURGERY     BREAST BIOPSY Left 11/09/2020   stereo bx 11/09/2020-"coil" clip path pending   COLONOSCOPY WITH PROPOFOL N/A 04/05/2020   Procedure: COLONOSCOPY WITH PROPOFOL;  Surgeon: Toledo, Benay Pike, MD;  Location: [facility] ENDOSCOPY;  Service: Gastroenterology;  Laterality: N/A;   ENDARTERECTOMY Left 05/13/2019   Procedure: ENDARTERECTOMY CAROTID;  Surgeon: Katha Cabal, MD;  Location: [facility] ORS;  Service: Vascular;  Laterality: Left;   ESOPHAGOGASTRODUODENOSCOPY (EGD) WITH PROPOFOL N/A 04/05/2020   Procedure: ESOPHAGOGASTRODUODENOSCOPY (EGD) WITH PROPOFOL;  Surgeon: Toledo, Benay Pike, MD;  Location: [facility] ENDOSCOPY;  Service: Gastroenterology;  Laterality: N/A;   ESOPHAGOGASTRODUODENOSCOPY (EGD) WITH PROPOFOL N/A 09/22/2020   Procedure: ESOPHAGOGASTRODUODENOSCOPY (EGD) WITH PROPOFOL;  Surgeon: Annamaria Helling, DO;  Location: [facility] ENDOSCOPY;  Service: Gastroenterology;  Laterality: N/A;   TUBAL LIGATION      Family History  Problem Relation Age of Onset   Diabetes Mother    Hypertension Mother    Cancer Father        leukemia   Alcohol abuse Sister    Breast cancer Neg Hx     Social History   Socioeconomic History   Marital status: Single    Spouse name: Not on file   Number of children: 3   Years of education: Not on file   Highest education  level: Not on file  Occupational History   Occupation: unemployed  Tobacco Use   Smoking status: Some Days    Packs/day: 1.00    Years: 38.00    Pack years: 38.00    Types: Cigarettes    Last attempt to quit: 04/03/2020    Years since quitting: 0.7   Smokeless tobacco: Never   Tobacco comments:     Smokes 3-4 cigarettes per week  Vaping Use   Vaping Use: Never used  Substance and Sexual Activity   Alcohol use: Not Currently    Comment: have not been drinking since surgery 04/2019   Drug use: Yes    Frequency: 3.0 times per week    Types: Marijuana    Comment: last use "last week"   Sexual activity: Not Currently    Birth control/protection: None  Other Topics Concern   Not on file  Social History Narrative   ** Merged History Encounter **       Right handed Drinks caffeine One story home   Social Determinants of Health   Financial Resource Strain: Not on file  Food Insecurity: No Food Insecurity   Worried About Charity fundraiser in the Last Year: Never true   Ran Out of Food in the Last Year: Never true  Transportation Needs: No Transportation Needs   Lack of Transportation (Medical): No   Lack of Transportation (Non-Medical): No  Physical Activity: Not on file  Stress: Not on file  Social Connections: Not on file  Intimate Partner Violence: Not on file    Outpatient Medications Prior to Visit  Medication Sig Dispense Refill   albuterol (VENTOLIN HFA) 108 (90 Base) MCG/ACT inhaler Inhale 2 puffs into the lungs every 6 (six) hours as needed for wheezing or shortness of breath. 6.7 g 0   butalbital-acetaminophen-caffeine (FIORICET) 50-325-40 MG tablet Take 1 tablet by mouth every 6 (six) hours as needed for headache. 20 tablet 0   cholecalciferol (VITAMIN D3) 25 MCG (1000 UNIT) tablet Take 1,000 Units by mouth daily.     ferrous sulfate 325 (65 FE) MG tablet Take 325 mg by mouth daily.     fluticasone (FLONASE) 50 MCG/ACT nasal spray PLACE 2 SPRAYS INTO BOTH NOSTRILS EVERY DAY 16 g 1   amLODipine (NORVASC) 10 MG tablet TAKE ONE TABLET BY MOUTH EVERY DAY 90 tablet 1   atorvastatin (LIPITOR) 40 MG tablet TAKE ONE TABLET BY MOUTH EVERY DAY 90 tablet 0   pantoprazole (PROTONIX) 40 MG tablet Take 1 tablet (40 mg total) by mouth once daily. 30 tablet 0    HYDROcodone-acetaminophen (NORCO/VICODIN) 5-325 MG tablet Take 1 tablet by mouth every 6 (six) hours as needed for moderate pain. 15 tablet 0   methocarbamol (ROBAXIN) 500 MG tablet 1 or 2 tablets every 6 hours as needed for muscle spasms. 15 tablet 0   No facility-administered medications prior to visit.    No Known Allergies  ROS Review of Systems  Constitutional: Negative.   Eyes: Negative.   Respiratory: Negative.    Cardiovascular: Negative.   Gastrointestinal:        Hiatal hernia  Genitourinary: Negative.   Musculoskeletal:        Pain to right thumb  Skin: Negative.   Neurological: Negative.   Psychiatric/Behavioral: Negative.       Objective:    Physical Exam HENT:     Head: Normocephalic and atraumatic.     Mouth/Throat:     Mouth: Mucous membranes are moist.  Eyes:  Extraocular Movements: Extraocular movements intact.     Conjunctiva/sclera: Conjunctivae normal.     Pupils: Pupils are equal, round, and reactive to light.  Cardiovascular:     Rate and Rhythm: Normal rate and regular rhythm.     Pulses: Normal pulses.     Heart sounds: Normal heart sounds.  Pulmonary:     Effort: Pulmonary effort is normal.     Breath sounds: Normal breath sounds.  Musculoskeletal:       Arms:  Skin:    General: Skin is warm.  Neurological:     General: No focal deficit present.     Mental Status: She is alert and oriented to person, place, and time. Mental status is at baseline.  Psychiatric:        Mood and Affect: Mood normal.        Behavior: Behavior normal.        Thought Content: Thought content normal.        Judgment: Judgment normal.    BP 139/89 (BP Location: Right Arm, Patient Position: Sitting, Cuff Size: Large)    Pulse 94    Temp 97.9 F (36.6 C) (Oral)    Resp 18    Ht 5' 3"  (1.6 m)    Wt 239 lb 8 oz (108.6 kg)    LMP 10/01/2016 (Approximate)    SpO2 93%    BMI 42.43 kg/m  Wt Readings from Last 3 Encounters:  12/25/20 239 lb 8 oz (108.6 kg)   11/16/20 229 lb 15 oz (104.3 kg)  09/21/20 230 lb (104.3 kg)   Encouraged weight loss  Health Maintenance Due  Topic Date Due   COVID-19 Vaccine (1) Never done   Pneumococcal Vaccine 16-98 Years old (1 - PCV) Never done   Hepatitis C Screening  Never done   TETANUS/TDAP  Never done   Zoster Vaccines- Shingrix (1 of 2) Never done   INFLUENZA VACCINE  Never done    There are no preventive care reminders to display for this patient.  Lab Results  Component Value Date   TSH 1.700 09/03/2017   Lab Results  Component Value Date   WBC 8.6 09/22/2020   HGB 9.0 (L) 09/22/2020   HCT 28.3 (L) 09/22/2020   MCV 91.0 09/22/2020   PLT 289 09/22/2020   Lab Results  Component Value Date   NA 141 09/22/2020   K 3.5 09/22/2020   CO2 23 09/22/2020   GLUCOSE 101 (H) 09/22/2020   BUN 16 09/22/2020   CREATININE 0.53 09/22/2020   BILITOT 1.0 09/21/2020   ALKPHOS 88 09/21/2020   AST 16 09/21/2020   ALT 10 09/21/2020   PROT 7.2 09/21/2020   ALBUMIN 3.4 (L) 09/21/2020   CALCIUM 8.4 (L) 09/22/2020   ANIONGAP 4 (L) 09/22/2020   EGFR 105 05/02/2020   Lab Results  Component Value Date   CHOL 162 06/16/2019   Lab Results  Component Value Date   HDL 53 06/16/2019   Lab Results  Component Value Date   LDLCALC 86 06/16/2019   Lab Results  Component Value Date   TRIG 131 06/16/2019   Lab Results  Component Value Date   CHOLHDL 3.1 06/16/2019   Lab Results  Component Value Date   HGBA1C 5.2 05/09/2019      Assessment & Plan:   1. Primary hypertension -Her blood pressure is improving, she will continue on current medication, DASH diet and exercise as tolerated. - amLODipine (NORVASC) 10 MG tablet; TAKE ONE TABLET BY MOUTH  EVERY DAY  Dispense: 90 tablet; Refill: 1  2. Hyperlipidemia associated with type 2 diabetes mellitus ([place]) -She will continue on current medication, low-fat low-cholesterol diet and exercise as tolerated. - atorvastatin (LIPITOR) 40 MG tablet; TAKE ONE  TABLET BY MOUTH EVERY DAY  Dispense: 90 tablet; Refill: 1  3. Family history of GERD -She states that her acid reflux is under control, she will continue on current medication. -Avoid spicy, fatty and fried food -Avoid sodas and sour juices -Avoid heavy meals -Avoid eating 4 hours before bedtime - pantoprazole (PROTONIX) 40 MG tablet; Take 1 tablet (40 mg total) by mouth once daily.  Dispense: 30 tablet; Refill: 3  4. Hiatal hernia -She was encouraged to complete the [facility] financial application for - Ambulatory referral to General Surgery.  She was advised to go to the emergency room for worsening symptoms.  5. Urinary incontinence, unspecified type -She was encouraged to complete the [facility] financial application for - Ambulatory referral to Urogynecology  6. Healthcare maintenance -Routine labs will be checked.  w/Diff; Future - Comp Met (CMET); Future - Comp Met (CMET) - CBC w/Diff - Vitamin D (25 hydroxy) - B12 and Folate Panel  7. Thumb pain, right -She was advised to wear hand brace, and will follow-up with Dr. Jefm Bryant will be in January.  She was advised to notify clinic or go to the emergency room for worsening symptoms.     Follow-up: Return in about 2 months (around 02/25/2021), or if symptoms worsen or fail to improve.    Nyles Mitton Jerold Coombe, NP

## 2020-12-25 NOTE — Patient Instructions (Signed)
Kegel Exercises ?Kegel exercises can help strengthen your pelvic floor muscles. The pelvic floor is a group of muscles that support your rectum, small intestine, and bladder. In females, pelvic floor muscles also help support the uterus. These muscles help you control the flow of urine and stool (feces). ?Kegel exercises are painless and simple. They do not require any equipment. Your provider may suggest Kegel exercises to: ?Improve bladder and bowel control. ?Improve sexual response. ?Improve weak pelvic floor muscles after surgery to remove the uterus (hysterectomy) or after pregnancy, in females. ?Improve weak pelvic floor muscles after prostate gland removal or surgery, in males. ?Kegel exercises involve squeezing your pelvic floor muscles. These are the same muscles you squeeze when you try to stop the flow of urine or keep from passing gas. The exercises can be done while sitting, standing, or lying down, but it is best to vary your position. ?Ask your health care provider which exercises are safe for you. Do exercises exactly as told by your health care provider and adjust them as directed. Do not begin these exercises until told by your health care provider. ?Exercises ?How to do Kegel exercises: ?Squeeze your pelvic floor muscles tight. You should feel a tight lift in your rectal area. If you are a female, you should also feel a tightness in your vaginal area. Keep your stomach, buttocks, and legs relaxed. ?Hold the muscles tight for up to 10 seconds. ?Breathe normally. ?Relax your muscles for up to 10 seconds. ?Repeat as told by your health care provider. ?Repeat this exercise daily as told by your health care provider. Continue to do this exercise for at least 4-6 weeks, or for as long as told by your health care provider. ?You may be referred to a physical therapist who can help you learn more about how to do Kegel exercises. ?Depending on your condition, your health care provider may  recommend: ?Varying how long you squeeze your muscles. ?Doing several sets of exercises every day. ?Doing exercises for several weeks. ?Making Kegel exercises a part of your regular exercise routine. ?This information is not intended to replace advice given to you by your health care provider. Make sure you discuss any questions you have with your health care provider. ?Document Revised: 05/10/2020 Document Reviewed: 05/10/2020 ?Elsevier Patient Education ? 2022 Elsevier Inc. ? ?

## 2020-12-26 ENCOUNTER — Other Ambulatory Visit: Payer: Self-pay

## 2020-12-26 ENCOUNTER — Ambulatory Visit: Payer: Medicaid Other | Admitting: Gerontology

## 2020-12-26 ENCOUNTER — Other Ambulatory Visit: Payer: Self-pay | Admitting: Gerontology

## 2020-12-26 DIAGNOSIS — E559 Vitamin D deficiency, unspecified: Secondary | ICD-10-CM

## 2020-12-26 LAB — COMPREHENSIVE METABOLIC PANEL
ALT: 7 IU/L (ref 0–32)
AST: 11 IU/L (ref 0–40)
Albumin/Globulin Ratio: 1.4 (ref 1.2–2.2)
Albumin: 4.4 g/dL (ref 3.8–4.9)
Alkaline Phosphatase: 145 IU/L — ABNORMAL HIGH (ref 44–121)
BUN/Creatinine Ratio: 11 (ref 9–23)
BUN: 7 mg/dL (ref 6–24)
Bilirubin Total: 0.8 mg/dL (ref 0.0–1.2)
CO2: 21 mmol/L (ref 20–29)
Calcium: 9.3 mg/dL (ref 8.7–10.2)
Chloride: 108 mmol/L — ABNORMAL HIGH (ref 96–106)
Creatinine, Ser: 0.64 mg/dL (ref 0.57–1.00)
Globulin, Total: 3.2 g/dL (ref 1.5–4.5)
Glucose: 89 mg/dL (ref 70–99)
Potassium: 4.3 mmol/L (ref 3.5–5.2)
Sodium: 145 mmol/L — ABNORMAL HIGH (ref 134–144)
Total Protein: 7.6 g/dL (ref 6.0–8.5)
eGFR: 104 mL/min/{1.73_m2} (ref >59)

## 2020-12-26 LAB — CBC WITH DIFFERENTIAL/PLATELET
Basophils Absolute: 0 10*3/uL (ref 0.0–0.2)
Basos: 1 %
EOS (ABSOLUTE): 0 10*3/uL (ref 0.0–0.4)
Eos: 1 %
Hematocrit: 38.3 % (ref 34.0–46.6)
Hemoglobin: 12.6 g/dL (ref 11.1–15.9)
Immature Grans (Abs): 0 10*3/uL (ref 0.0–0.1)
Immature Granulocytes: 0 %
Lymphocytes Absolute: 2.1 10*3/uL (ref 0.7–3.1)
Lymphs: 28 %
MCH: 26.6 pg (ref 26.6–33.0)
MCHC: 32.9 g/dL (ref 31.5–35.7)
MCV: 81 fL (ref 79–97)
Monocytes Absolute: 0.7 10*3/uL (ref 0.1–0.9)
Monocytes: 9 %
Neutrophils Absolute: 4.7 10*3/uL (ref 1.4–7.0)
Neutrophils: 61 %
Platelets: 388 10*3/uL (ref 150–450)
RBC: 4.74 x10E6/uL (ref 3.77–5.28)
RDW: 15.4 % (ref 11.7–15.4)
WBC: 7.5 10*3/uL (ref 3.4–10.8)

## 2020-12-26 LAB — VITAMIN D 25 HYDROXY (VIT D DEFICIENCY, FRACTURES): Vit D, 25-Hydroxy: 7.3 ng/mL — ABNORMAL LOW (ref 30.0–100.0)

## 2020-12-26 LAB — B12 AND FOLATE PANEL
Folate: 4.9 ng/mL (ref >3.0)
Vitamin B-12: 619 pg/mL (ref 232–1245)

## 2020-12-26 MED ORDER — VITAMIN D (ERGOCALCIFEROL) 1.25 MG (50000 UNIT) PO CAPS
50000.0000 [IU] | ORAL_CAPSULE | ORAL | 0 refills | Status: DC
Start: 2020-12-26 — End: 2021-02-27
  Filled 2020-12-26: qty 12, 84d supply, fill #0

## 2020-12-27 ENCOUNTER — Other Ambulatory Visit: Payer: Self-pay

## 2020-12-31 ENCOUNTER — Other Ambulatory Visit: Payer: Self-pay

## 2021-01-01 ENCOUNTER — Other Ambulatory Visit: Payer: Self-pay

## 2021-01-03 ENCOUNTER — Other Ambulatory Visit: Payer: Self-pay

## 2021-01-15 ENCOUNTER — Ambulatory Visit: Payer: Medicaid Other | Admitting: Gerontology

## 2021-01-21 ENCOUNTER — Ambulatory Visit: Payer: Self-pay | Admitting: Surgery

## 2021-02-06 ENCOUNTER — Ambulatory Visit: Payer: Medicaid Other | Admitting: Rheumatology

## 2021-02-18 ENCOUNTER — Ambulatory Visit: Payer: Medicaid Other | Admitting: Urology

## 2021-02-18 ENCOUNTER — Encounter: Payer: Self-pay | Admitting: Urology

## 2021-02-27 ENCOUNTER — Ambulatory Visit: Payer: Medicaid Other | Admitting: Gerontology

## 2021-02-27 ENCOUNTER — Encounter: Payer: Self-pay | Admitting: Gerontology

## 2021-02-27 ENCOUNTER — Other Ambulatory Visit: Payer: Self-pay

## 2021-02-27 VITALS — BP 177/99 | HR 87 | Temp 97.6°F | Resp 16 | Ht 63.0 in | Wt 240.1 lb

## 2021-02-27 DIAGNOSIS — K219 Gastro-esophageal reflux disease without esophagitis: Secondary | ICD-10-CM | POA: Insufficient documentation

## 2021-02-27 DIAGNOSIS — E1169 Type 2 diabetes mellitus with other specified complication: Secondary | ICD-10-CM

## 2021-02-27 DIAGNOSIS — E785 Hyperlipidemia, unspecified: Secondary | ICD-10-CM

## 2021-02-27 DIAGNOSIS — I10 Essential (primary) hypertension: Secondary | ICD-10-CM

## 2021-02-27 DIAGNOSIS — R109 Unspecified abdominal pain: Secondary | ICD-10-CM

## 2021-02-27 DIAGNOSIS — R42 Dizziness and giddiness: Secondary | ICD-10-CM | POA: Insufficient documentation

## 2021-02-27 DIAGNOSIS — Z8379 Family history of other diseases of the digestive system: Secondary | ICD-10-CM

## 2021-02-27 HISTORY — DX: Gastro-esophageal reflux disease without esophagitis: K21.9

## 2021-02-27 MED ORDER — ATORVASTATIN CALCIUM 40 MG PO TABS
ORAL_TABLET | Freq: Every day | ORAL | 1 refills | Status: AC
  Filled 2021-02-27: qty 30, 30d supply, fill #0
  Filled 2021-02-27: qty 90, 90d supply, fill #0

## 2021-02-27 MED ORDER — AMLODIPINE BESYLATE 10 MG PO TABS
ORAL_TABLET | Freq: Every day | ORAL | 1 refills | Status: AC
  Filled 2021-02-27: qty 30, 30d supply, fill #0
  Filled 2021-02-27: qty 90, 90d supply, fill #0

## 2021-02-27 MED ORDER — PANTOPRAZOLE SODIUM 40 MG PO TBEC
40.0000 mg | DELAYED_RELEASE_TABLET | Freq: Every day | ORAL | 3 refills | Status: DC
  Filled 2021-02-27 (×2): qty 30, 30d supply, fill #0

## 2021-02-27 NOTE — Progress Notes (Signed)
Established Patient Office Visit  Subjective:  Patient ID: Kimberly Mcdonald, female    DOB: [birth year]  Age: 56 y.o. MRN: [id number]  CC:  Chief Complaint  Patient presents with   Follow-up   Abdominal Pain    Patient c/o abdominal pain x 1 day. Patient states she had a small BM this morning, but feels like she needs to have a large BM.    HPI Sebrina Kessner is a 56 year old female who has history of allergy, anemia arthritis, hypertension, hyperlipidemia, stroke presents for c/o abdominal pain that started 1 day ago. She states that she moved her bowel 2 days ago, but feels bloated and needs to move her bowel. She denies constipation, nausea/vomiting. She c/o worsening acid reflux and has being out of Protonix for 2 months. She also c/o dizziness that has been going on for 2 days. She states that the room spins at night when she changes position from sitting to lying down on her bed. She denies dizzy spells when walking or changing position from a sitting to standing position. Overall, she states that she's doing well, and offers no further complaint.  Past Medical History:  Diagnosis Date   Acid reflux 02/27/2021   Allergy    seasonal   Anemia due to blood loss 05/08/2019   Arthritis    knees and back   Cerebrovascular accident (CVA) ([place])    HTN (hypertension)    Hyperlipidemia    Hypertension    Stroke [facility])    Substance abuse ([place])    Marijuana smoker x35 yrs    Past Surgical History:  Procedure Laterality Date   ABDOMINAL SURGERY     BREAST BIOPSY Left 11/09/2020   stereo bx 11/09/2020-"coil" clip path pending   COLONOSCOPY WITH PROPOFOL N/A 04/05/2020   Procedure: COLONOSCOPY WITH PROPOFOL;  Surgeon: Toledo, Benay Pike, MD;  Location: [facility] ENDOSCOPY;  Service: Gastroenterology;  Laterality: N/A;   ENDARTERECTOMY Left 05/13/2019   Procedure: ENDARTERECTOMY CAROTID;  Surgeon: Katha Cabal, MD;  Location: [facility] ORS;  Service: Vascular;  Laterality: Left;    ESOPHAGOGASTRODUODENOSCOPY (EGD) WITH PROPOFOL N/A 04/05/2020   Procedure: ESOPHAGOGASTRODUODENOSCOPY (EGD) WITH PROPOFOL;  Surgeon: Toledo, Benay Pike, MD;  Location: [facility] ENDOSCOPY;  Service: Gastroenterology;  Laterality: N/A;   ESOPHAGOGASTRODUODENOSCOPY (EGD) WITH PROPOFOL N/A 09/22/2020   Procedure: ESOPHAGOGASTRODUODENOSCOPY (EGD) WITH PROPOFOL;  Surgeon: Annamaria Helling, DO;  Location: [facility] ENDOSCOPY;  Service: Gastroenterology;  Laterality: N/A;   TUBAL LIGATION      Family History  Problem Relation Age of Onset   Diabetes Mother    Hypertension Mother    Cancer Father        leukemia   Alcohol abuse Sister    Breast cancer Neg Hx     Social History   Socioeconomic History   Marital status: Single    Spouse name: Not on file   Number of children: 3   Years of education: Not on file   Highest education level: Not on file  Occupational History   Occupation: unemployed  Tobacco Use   Smoking status: Some Days    Years: 38.00    Types: Cigarettes    Last attempt to quit: 04/03/2020    Years since quitting: 0.9   Smokeless tobacco: Never   Tobacco comments:    Smokes 3-4 cigarettes per week  Vaping Use   Vaping Use: Never used  Substance and Sexual Activity   Alcohol use: Not Currently    Comment: have not been drinking  since surgery 04/2019   Drug use: Yes    Frequency: 3.0 times per week    Types: Marijuana    Comment: last use "last week"   Sexual activity: Not Currently    Birth control/protection: None  Other Topics Concern   Not on file  Social History Narrative   ** Merged History Encounter **       Right handed Drinks caffeine One story home   Social Determinants of Health   Financial Resource Strain: Not on file  Food Insecurity: No Food Insecurity   Worried About Charity fundraiser in the Last Year: Never true   Ran Out of Food in the Last Year: Never true  Transportation Needs: No Transportation Needs   Lack of Transportation  (Medical): No   Lack of Transportation (Non-Medical): No  Physical Activity: Not on file  Stress: Not on file  Social Connections: Not on file  Intimate Partner Violence: Not on file    Outpatient Medications Prior to Visit  Medication Sig Dispense Refill   acetaminophen (TYLENOL) 500 MG tablet Take 1,000 mg by mouth every 6 (six) hours as needed.     albuterol (VENTOLIN HFA) 108 (90 Base) MCG/ACT inhaler Inhale 2 puffs into the lungs every 6 (six) hours as needed for wheezing or shortness of breath. 6.7 g 0   butalbital-acetaminophen-caffeine (FIORICET) 50-325-40 MG tablet Take 1 tablet by mouth every 6 (six) hours as needed for headache. 20 tablet 0   cholecalciferol (VITAMIN D3) 25 MCG (1000 UNIT) tablet Take 1,000 Units by mouth daily.     ferrous sulfate 325 (65 FE) MG tablet Take 325 mg by mouth daily.     fluticasone (FLONASE) 50 MCG/ACT nasal spray PLACE 2 SPRAYS INTO BOTH NOSTRILS EVERY DAY 16 g 1   amLODipine (NORVASC) 10 MG tablet TAKE ONE TABLET BY MOUTH EVERY DAY 90 tablet 1   atorvastatin (LIPITOR) 40 MG tablet TAKE ONE TABLET BY MOUTH EVERY DAY 90 tablet 1   pantoprazole (PROTONIX) 40 MG tablet Take 1 tablet (40 mg total) by mouth once daily. (Patient not taking: Reported on 02/27/2021) 30 tablet 3   Vitamin D, Ergocalciferol, (DRISDOL) 1.25 MG (50000 UNIT) CAPS capsule Take 1 capsule (50,000 Units total) by mouth once every 7 (seven) days. (Patient not taking: Reported on 02/27/2021) 12 capsule 0   No facility-administered medications prior to visit.    No Known Allergies  ROS Review of Systems  Constitutional: Negative.   Respiratory: Negative.    Cardiovascular: Negative.   Gastrointestinal:  Positive for abdominal pain. Negative for constipation.  Neurological:  Positive for dizziness.     Objective:    Physical Exam HENT:     Head: Normocephalic and atraumatic.     Right Ear: Hearing, tympanic membrane, ear canal and external ear normal.     Left Ear:  Hearing, tympanic membrane, ear canal and external ear normal.     Mouth/Throat:     Mouth: Mucous membranes are moist.  Eyes:     Extraocular Movements: Extraocular movements intact.     Conjunctiva/sclera: Conjunctivae normal.     Pupils: Pupils are equal, round, and reactive to light.  Cardiovascular:     Rate and Rhythm: Normal rate and regular rhythm.     Pulses: Normal pulses.     Heart sounds: Normal heart sounds.  Pulmonary:     Effort: Pulmonary effort is normal.     Breath sounds: Normal breath sounds.  Abdominal:     General:  Bowel sounds are normal. There is no distension.     Palpations: Abdomen is soft.     Tenderness: There is no abdominal tenderness.  Neurological:     General: No focal deficit present.     Mental Status: She is alert and oriented to person, place, and time. Mental status is at baseline.    BP (!) 177/99 (BP Location: Right Arm, Patient Position: Sitting, Cuff Size: Large)    Pulse 87    Temp 97.6 F (36.4 C) (Oral)    Resp 16    Ht '5\' 3"'  (1.6 m)    Wt 240 lb 1.6 oz (108.9 kg)    LMP 10/01/2016 (Approximate)    SpO2 94%    BMI 42.53 kg/m  Wt Readings from Last 3 Encounters:  02/27/21 240 lb 1.6 oz (108.9 kg)  12/25/20 239 lb 8 oz (108.6 kg)  11/16/20 229 lb 15 oz (104.3 kg)   Encouraged on weight loss  Health Maintenance Due  Topic Date Due   COVID-19 Vaccine (1) Never done   Hepatitis C Screening  Never done   TETANUS/TDAP  Never done   Zoster Vaccines- Shingrix (1 of 2) Never done   INFLUENZA VACCINE  Never done   URINE MICROALBUMIN  02/28/2021    There are no preventive care reminders to display for this patient.  Lab Results  Component Value Date   TSH 1.700 09/03/2017   Lab Results  Component Value Date   WBC 7.5 12/25/2020   HGB 12.6 12/25/2020   HCT 38.3 12/25/2020   MCV 81 12/25/2020   PLT 388 12/25/2020   Lab Results  Component Value Date   NA 145 (H) 12/25/2020   K 4.3 12/25/2020   CO2 21 12/25/2020   GLUCOSE 89  12/25/2020   BUN 7 12/25/2020   CREATININE 0.64 12/25/2020   BILITOT 0.8 12/25/2020   ALKPHOS 145 (H) 12/25/2020   AST 11 12/25/2020   ALT 7 12/25/2020   PROT 7.6 12/25/2020   ALBUMIN 4.4 12/25/2020   CALCIUM 9.3 12/25/2020   ANIONGAP 4 (L) 09/22/2020   EGFR 104 12/25/2020   Lab Results  Component Value Date   CHOL 162 06/16/2019   Lab Results  Component Value Date   HDL 53 06/16/2019   Lab Results  Component Value Date   LDLCALC 86 06/16/2019   Lab Results  Component Value Date   TRIG 131 06/16/2019   Lab Results  Component Value Date   CHOLHDL 3.1 06/16/2019   Lab Results  Component Value Date   HGBA1C 5.2 05/09/2019      Assessment & Plan:    1. Primary hypertension - Her blood pressure is not under control, she was advised to [facility], check her blood pressure , record and bring log to follow up appointment. She will continue on DASH diet. - amLODipine (NORVASC) 10 MG tablet; TAKE ONE TABLET BY MOUTH ONCE EVERY DAY.  Dispense: 90 tablet; Refill: 1  2. Hyperlipidemia associated with type 2 diabetes mellitus ([place]) - She will continue on current medication, low fat/non cholesterol diet. - atorvastatin (LIPITOR) 40 MG tablet; TAKE ONE TABLET BY MOUTH ONCE EVERY DAY.  Dispense: 90 tablet; Refill: 1  3. Family history of GERD - Her acid reflux is not under control, she will pick up medication. -Avoid spicy, fatty and fried food -Avoid sodas and sour juices -Avoid heavy meals -Avoid eating 4 hours before bedtime -Elevate head of bed at night - pantoprazole (  PROTONIX) 40 MG tablet; Take 1 tablet (40 mg total) by mouth once daily.  Dispense: 30 tablet; Refill: 3  4. Dizziness -Unknown etiology, might be due to elevated blood pressure.  She was encouraged to change positions slowly and notify clinic for worsening symptoms.  5. Abdominal pain, unspecified abdominal location -She denies abdominal pain, but worsening acid reflux  because she was unable to afford Protonix for 2 months.  She was advised to pick up and start taking Protonix, continue on high-fiber diet and increase water intake.     Follow-up: Return in about 1 month (around 03/27/2021), or if symptoms worsen or fail to improve.    Jaimie Pippins Jerold Coombe, NP

## 2021-02-28 ENCOUNTER — Other Ambulatory Visit: Payer: Self-pay

## 2021-03-01 ENCOUNTER — Other Ambulatory Visit: Payer: Self-pay

## 2021-03-04 ENCOUNTER — Ambulatory Visit: Payer: Self-pay | Admitting: Surgery

## 2021-03-06 ENCOUNTER — Other Ambulatory Visit: Payer: Self-pay

## 2021-03-06 ENCOUNTER — Ambulatory Visit: Payer: Medicaid Other

## 2021-03-12 ENCOUNTER — Ambulatory Visit: Payer: Medicaid Other

## 2021-03-12 ENCOUNTER — Other Ambulatory Visit: Payer: Self-pay

## 2021-03-14 ENCOUNTER — Ambulatory Visit: Payer: Medicaid Other

## 2021-03-19 ENCOUNTER — Ambulatory Visit: Payer: Medicaid Other

## 2021-03-19 ENCOUNTER — Other Ambulatory Visit: Payer: Self-pay

## 2021-03-20 ENCOUNTER — Ambulatory Visit (INDEPENDENT_AMBULATORY_CARE_PROVIDER_SITE_OTHER): Payer: Self-pay | Admitting: Surgery

## 2021-03-20 VITALS — BP 141/90 | HR 101 | Temp 98.2°F | Ht 63.0 in | Wt 239.6 lb

## 2021-03-20 DIAGNOSIS — K449 Diaphragmatic hernia without obstruction or gangrene: Secondary | ICD-10-CM

## 2021-03-20 DIAGNOSIS — Z6841 Body Mass Index (BMI) 40.0 and over, adult: Secondary | ICD-10-CM

## 2021-03-20 NOTE — Patient Instructions (Addendum)
Your Barium Swallow is scheduled for 03/27/2021 at 10 am (arrive by 9:30). Nothing to eat or drink 3 hours prior.   Try to lose weight and work toward a goal of 40 or below BMI.  [facility]. They will call you for an appointment.    If you have any concerns or questions, please feel free to call our office.    Hiatal Hernia  A hiatal hernia occurs when part of the stomach slides above the muscle that separates the abdomen from the chest (diaphragm). A person can be born with a hiatal hernia (congenital), or it may develop over time. In almost all cases of hiatal hernia, only the top part of the stomach pushes through the diaphragm. Many people have a hiatal hernia with no symptoms. The larger the hernia, the more likely it is that you will have symptoms. In some cases, a hiatal hernia allows stomach acid to flow back into the tube that carries food from your mouth to your stomach (esophagus). This may cause heartburn symptoms. Severe heartburn symptoms may mean that you have developed a condition called gastroesophageal reflux disease (GERD). What are the causes? This condition is caused by a weakness in the opening (hiatus) where the esophagus passes through the diaphragm to attach to the upper part of the stomach. A person may be born with a weakness in the hiatus, or a weakness can develop over time. What increases the risk? This condition is more likely to develop in: Older people. Age is a major risk factor for a hiatal hernia, especially if you are over the age of 450. Pregnant women. People who are overweight. People who have frequent constipation. What are the signs or symptoms? Symptoms of this condition usually develop in the form of GERD symptoms. Symptoms include: Heartburn. Belching. Indigestion. Trouble swallowing. Coughing or wheezing. Sore throat. Hoarseness. Chest pain. Nausea and vomiting. How is this diagnosed? This condition  may be diagnosed during testing for GERD. Tests that may be done include: X-rays of your stomach or chest. An upper gastrointestinal (GI) series. This is an X-ray exam of your GI tract that is taken after you swallow a chalky liquid that shows up clearly on the X-ray. Endoscopy. This is a procedure to look into your stomach using a thin, flexible tube that has a tiny camera and light on the end of it. How is this treated? This condition may be treated by: Dietary and lifestyle changes to help reduce GERD symptoms. Medicines. These may include: Over-the-counter antacids. Medicines that make your stomach empty more quickly. Medicines that block the production of stomach acid (H2 blockers). Stronger medicines to reduce stomach acid (proton pump inhibitors). Surgery to repair the hernia, if other treatments are not helping. If you have no symptoms, you may not need treatment. Follow these instructions at home: Lifestyle and activity Do not use any products that contain nicotine or tobacco, such as cigarettes and e-cigarettes. If you need help quitting, ask your health care provider. Try to achieve and maintain a healthy body weight. Avoid putting pressure on your abdomen. Anything that puts pressure on your abdomen increases the amount of acid that may be pushed up into your esophagus. Avoid bending over, especially after eating. Raise the head of your bed by putting blocks under the legs. This keeps your head and esophagus higher than your stomach. Do not wear tight clothing around your chest or stomach. Try not to strain when having a bowel  movement, when urinating, or when lifting heavy objects. Eating and drinking Avoid foods that can worsen GERD symptoms. These may include: Fatty foods, like fried foods. Citrus fruits, like oranges or lemon. Other foods and drinks that contain acid, like orange juice or tomatoes. Spicy food. Chocolate. Eat frequent small meals instead of three large  meals a day. This helps prevent your stomach from getting too full. Eat slowly. Do not lie down right after eating. Do not eat 1-2 hours before bed. Do not drink beverages with caffeine. These include cola, coffee, cocoa, and tea. Do not drink alcohol. General instructions Take over-the-counter and prescription medicines only as told by your health care provider. Keep all follow-up visits as told by your health care provider. This is important. Contact a health care provider if: Your symptoms are not controlled with medicines or lifestyle changes. You are having trouble swallowing. You have coughing or wheezing that will not go away. Get help right away if: Your pain is getting worse. Your pain spreads to your arms, neck, jaw, teeth, or back. You have shortness of breath. You sweat for no reason. You feel sick to your stomach (nauseous) or you vomit. You vomit blood. You have bright red blood in your stools. You have black, tarry stools. Summary A hiatal hernia occurs when part of the stomach slides above the muscle that separates the abdomen from the chest (diaphragm). A person may be born with a weakness in the hiatus, or a weakness can develop over time. Symptoms of hiatal hernia may include heartburn, trouble swallowing, or sore throat. Management of hiatal hernia includes eating frequent small meals instead of three large meals a day. Get help right away if you vomit blood, have bright red blood in your stools, or have black, tarry stools. This information is not intended to replace advice given to you by your health care provider. Make sure you discuss any questions you have with your health care provider. Document Revised: 12/01/2019 Document Reviewed: 12/01/2019 Elsevier Patient Education  2022 [employer]   Calorie Counting for Kimberly Mcdonald Loss  Calories are units of energy. Your body needs a certain number of calories from food to keep going throughout the day. When you eat  or drink more calories than your body needs, your body stores the extra calories mostly as fat. When you eat or drink fewer calories than your body needs, your body burns fat to get the energy it needs. Calorie counting means keeping track of how many calories you eat and drink each day. Calorie counting can be helpful if you need to lose weight. If you eat fewer calories than your body needs, you should lose weight. Ask your health care provider what a healthy weight is for you. For calorie counting to work, you will need to eat the right number of calories each day to lose a healthy amount of weight per week. A dietitian can help you figure out how many calories you need in a day and will suggest ways to reach your calorie goal. A healthy amount of weight to lose each week is usually 1-2 lb (0.5-0.9 kg). This usually means that your daily calorie intake should be reduced by 500-750 calories. Eating 1,200-1,500 calories a day can help most women lose weight. Eating 1,500-1,800 calories a day can help most men lose weight. What do I need to know about calorie counting? Work with your health care provider or dietitian to determine how many calories you should get each day. To meet  your daily calorie goal, you will need to: Find out how many calories are in each food that you would like to eat. Try to do this before you eat. Decide how much of the food you plan to eat. Keep a food log. Do this by writing down what you ate and how many calories it had. To successfully lose weight, it is important to balance calorie counting with a healthy lifestyle that includes regular activity. Where do I find calorie information? The number of calories in a food can be found on a Nutrition Facts label. If a food does not have a Nutrition Facts label, try to look up the calories online or ask your dietitian for help. Remember that calories are listed per serving. If you choose to have more than one serving of a food,  you will have to multiply the calories per serving by the number of servings you plan to eat. For example, the label on a package of bread might say that a serving size is 1 slice and that there are 90 calories in a serving. If you eat 1 slice, you will have eaten 90 calories. If you eat 2 slices, you will have eaten 180 calories. How do I keep a food log? After each time that you eat, record the following in your food log as soon as possible: What you ate. Be sure to include toppings, sauces, and other extras on the food. How much you ate. This can be measured in cups, ounces, or number of items. How many calories were in each food and drink. The total number of calories in the food you ate. Keep your food log near you, such as in a pocket-sized notebook or on an app or website on your mobile phone. Some programs will calculate calories for you and show you how many calories you have left to meet your daily goal. What are some portion-control tips? Know how many calories are in a serving. This will help you know how many servings you can have of a certain food. Use a measuring cup to measure serving sizes. You could also try weighing out portions on a kitchen scale. With time, you will be able to estimate serving sizes for some foods. Take time to put servings of different foods on your favorite plates or in your favorite bowls and cups so you know what a serving looks like. Try not to eat straight from a food's packaging, such as from a bag or box. Eating straight from the package makes it hard to see how much you are eating and can lead to overeating. Put the amount you would like to eat in a cup or on a plate to make sure you are eating the right portion. Use smaller plates, glasses, and bowls for smaller portions and to prevent overeating. Try not to multitask. For example, avoid watching TV or using your computer while eating. If it is time to eat, sit down at a table and enjoy your food. This  will help you recognize when you are full. It will also help you be more mindful of what and how much you are eating. What are tips for following this plan? Reading food labels Check the calorie count compared with the serving size. The serving size may be smaller than what you are used to eating. Check the source of the calories. Try to choose foods that are high in protein, fiber, and vitamins, and low in saturated fat, trans fat, and sodium. Shopping  Read nutrition labels while you shop. This will help you make healthy decisions about which foods to buy. Pay attention to nutrition labels for low-fat or fat-free foods. These foods sometimes have the same number of calories or more calories than the full-fat versions. They also often have added sugar, starch, or salt to make up for flavor that was removed with the fat. Make a grocery list of lower-calorie foods and stick to it. Cooking Try to cook your favorite foods in a healthier way. For example, try baking instead of frying. Use low-fat dairy products. Meal planning Use more fruits and vegetables. One-half of your plate should be fruits and vegetables. Include lean proteins, such as chicken, [place], and fish. Lifestyle Each week, aim to do one of the following: 150 minutes of moderate exercise, such as walking. 75 minutes of vigorous exercise, such as running. General information Know how many calories are in the foods you eat most often. This will help you calculate calorie counts faster. Find a way of tracking calories that works for you. Get creative. Try different apps or programs if writing down calories does not work for you. What foods should I eat?  Eat nutritious foods. It is better to have a nutritious, high-calorie food, such as an avocado, than a food with few nutrients, such as a bag of potato chips. Use your calories on foods and drinks that will fill you up and will not leave you hungry soon after eating. Examples of  foods that fill you up are nuts and nut butters, vegetables, lean proteins, and high-fiber foods such as whole grains. High-fiber foods are foods with more than 5 g of fiber per serving. Pay attention to calories in drinks. Low-calorie drinks include water and unsweetened drinks. The items listed above may not be a complete list of foods and beverages you can eat. Contact a dietitian for more information. What foods should I limit? Limit foods or drinks that are not good sources of vitamins, minerals, or protein or that are high in unhealthy fats. These include: Candy. Other sweets. Sodas, specialty coffee drinks, alcohol, and juice. The items listed above may not be a complete list of foods and beverages you should avoid. Contact a dietitian for more information. How do I count calories when eating out? Pay attention to portions. Often, portions are much larger when eating out. Try these tips to keep portions smaller: Consider sharing a meal instead of getting your own. If you get your own meal, eat only half of it. Before you start eating, ask for a container and put half of your meal into it. When available, consider ordering smaller portions from the menu instead of full portions. Pay attention to your food and drink choices. Knowing the way food is cooked and what is included with the meal can help you eat fewer calories. If calories are listed on the menu, choose the lower-calorie options. Choose dishes that include vegetables, fruits, whole grains, low-fat dairy products, and lean proteins. Choose items that are boiled, broiled, grilled, or steamed. Avoid items that are buttered, battered, fried, or served with cream sauce. Items labeled as crispy are usually fried, unless stated otherwise. Choose water, low-fat milk, unsweetened iced tea, or other drinks without added sugar. If you want an alcoholic beverage, choose a lower-calorie option, such as a glass of wine or light beer. Ask for  dressings, sauces, and syrups on the side. These are usually high in calories, so you should limit the amount you eat.  If you want a salad, choose a garden salad and ask for grilled meats. Avoid extra toppings such as bacon, cheese, or fried items. Ask for the dressing on the side, or ask for olive oil and vinegar or lemon to use as dressing. Estimate how many servings of a food you are given. Knowing serving sizes will help you be aware of how much food you are eating at restaurants. Where to find more information Centers for Disease Control and Prevention: FootballExhibition.com.br U.[place]. Department of Agriculture: WrestlingReporter.dk Summary Calorie counting means keeping track of how many calories you eat and drink each day. If you eat fewer calories than your body needs, you should lose weight. A healthy amount of weight to lose per week is usually 1-2 lb (0.5-0.9 kg). This usually means reducing your daily calorie intake by 500-750 calories. The number of calories in a food can be found on a Nutrition Facts label. If a food does not have a Nutrition Facts label, try to look up the calories online or ask your dietitian for help. Use smaller plates, glasses, and bowls for smaller portions and to prevent overeating. Use your calories on foods and drinks that will fill you up and not leave you hungry shortly after a meal. This information is not intended to replace advice given to you by your health care provider. Make sure you discuss any questions you have with your health care provider. Document Revised: 02/10/2019 Document Reviewed: 02/10/2019 Elsevier Patient Education  2022 [employer].

## 2021-03-23 NOTE — Progress Notes (Signed)
?Surgical Consultation ? ?03/23/2021 ? ?Kimberly Mcdonald is an 56 y.o. female.  ? ?Chief Complaint  ?Patient presents with  ? New Patient (Initial Visit)  ?  Hiatal hernia  ? ? ? ?HPI: 56 year old female seen in consultation at the request of Mrs. Iloabachie NP, she does have a significant history of reflux disease as well as a large paraesophageal hernia.  She does have some swallowing difficulty and feels pressures in her chest.  She did have a CT scan as well as an upper endoscopy and I have personally reviewed the images.  There is evidence of a type III paraesophageal hernia with about two thirds of the stomach within the mediastinum.  There was no strictures on EGD.  She does have a BMI of 42.4. ?She does have a history of gastritis and GI bleed presumably from Kimberly Mcdonald ulcers and gastritis. Found of EGD performed by Dr Kimberly Mcdonald. ?CBC and bmp nml.  ?Prior hx of left carotid endarterectomy. ?Prior tubal ligation ? ?Past Medical History:  ?Diagnosis Date  ? Acid reflux 02/27/2021  ? Allergy   ? seasonal  ? Anemia due to blood loss 05/08/2019  ? Arthritis   ? knees and back  ? Cerebrovascular accident (CVA) (Kimberly Mcdonald)   ? HTN (hypertension)   ? Hyperlipidemia   ? Hypertension   ? Stroke Kimberly Mcdonald)   ? Substance abuse (Kimberly Mcdonald)   ? 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 ? ?Past Surgical History:  ?Procedure Laterality Date  ? ABDOMINAL SURGERY    ? BREAST BIOPSY Left 11/09/2020  ? stereo bx 11/09/2020-"coil" clip path pending  ? COLONOSCOPY WITH PROPOFOL N/A 04/05/2020  ? Procedure: COLONOSCOPY WITH PROPOFOL;  Surgeon: Kimberly Mcdonald;  Location: [facility] ENDOSCOPY;  Service: Gastroenterology;  Laterality: N/A;  ? ENDARTERECTOMY Left 05/13/2019  ? Procedure: ENDARTERECTOMY CAROTID;  Surgeon: Renford Dills, Mcdonald;  Location: [facility] ORS;  Service: Vascular;  Laterality: Left;  ? ESOPHAGOGASTRODUODENOSCOPY (EGD) WITH PROPOFOL N/A 04/05/2020  ? Procedure: ESOPHAGOGASTRODUODENOSCOPY (EGD) WITH PROPOFOL;  Surgeon: Kimberly Mcdonald;   Location: [facility] ENDOSCOPY;  Service: Gastroenterology;  Laterality: N/A;  ? ESOPHAGOGASTRODUODENOSCOPY (EGD) WITH PROPOFOL N/A 09/22/2020  ? Procedure: ESOPHAGOGASTRODUODENOSCOPY (EGD) WITH PROPOFOL;  Surgeon: Kimberly Mcdonald;  Location: [facility] ENDOSCOPY;  Service: Gastroenterology;  Laterality: N/A;  ? TUBAL LIGATION    ? ? ?Family History  ?Problem Relation Age of Onset  ? Diabetes Mother   ? Hypertension Mother   ? Cancer Father   ?     leukemia  ? Alcohol abuse Sister   ? Breast cancer Neg Hx   ? ? ?Social History:  reports that she has been smoking cigarettes. She has never used smokeless tobacco. She reports that she does not currently use alcohol. She reports current drug use. Frequency: 3.00 times per week. Drug: Marijuana. ? ?Allergies: No Known Allergies ? ?Medications reviewed. ? ? ? ? ?ROS ?Full ROS performed and is otherwise negative other than what is stated in the HPI ? ? ? ?BP (!) 141/90   Pulse (!) 101   Temp 98.2 ?F (36.8 ?C) (Oral)   Ht 5\' 3"  (1.6 m)   Wt 239 lb 9.6 oz (108.7 kg)   LMP 10/01/2016 (Approximate)   SpO2 96%   BMI 42.44 kg/m?  ? ?Physical Exam ?Vitals and nursing note reviewed. Exam conducted with a chaperone present.  ?Constitutional:   ?   General: She is not in acute distress. ?   Appearance: Normal appearance.  ?Neck:  ?  Vascular: No carotid bruit.  ?Cardiovascular:  ?   Rate and Rhythm: Normal rate and regular rhythm.  ?Pulmonary:  ?   Effort: Pulmonary effort is normal. No respiratory distress.  ?   Breath sounds: Normal breath sounds. No stridor. No wheezing or rhonchi.  ?Abdominal:  ?   General: Abdomen is flat. There is no distension.  ?   Palpations: Abdomen is soft. There is no mass.  ?   Tenderness: There is no abdominal tenderness. There is no guarding or rebound.  ?   Hernia: No hernia is present.  ?Musculoskeletal:     ?   General: No tenderness. Normal range of motion.  ?   Cervical back: Normal range of motion and neck supple. No rigidity or  tenderness.  ?Lymphadenopathy:  ?   Cervical: No cervical adenopathy.  ?Skin: ?   General: Skin is warm and dry.  ?   Capillary Refill: Capillary refill takes less than 2 seconds.  ?Neurological:  ?   General: No focal deficit present.  ?   Mental Status: She is alert and oriented to person, place, and time.  ?Psychiatric:     ?   Mood and Affect: Mood normal.     ?   Behavior: Behavior normal.     ?   Thought Content: Thought content normal.  ? ? 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  Unfortunately patient does have a BMI of 42 and these make things complicated when he comes to the repair of this large paraesophageal hernias.  I was very candid with her regarding repair of paraesophageal hernia and the higher chances of recurrence on patients with higher BMI.  She wishes to continue work-up and I Mcdonald think is reasonable to obtain a swallow study.  She will likely benefit from repair when she is optimized from a weight perspective.  She is motivated to lose weight and I have refer her to weight loss program.  Also discussed with her the role of potential bariatric surgery.  Given that the stomach is within the mediastinum I Mcdonald think that this also is challenging and not necessarily a good option.  I Mcdonald think that the best option for her is to improve her weight as much as possible medically and on her bone and then Mcdonald an elective paraesophageal hernia repair.  I will see her back in a couple months when she completes appropriate work-up.  A copy of this report was sent to the referring provider ?Please note that I spent greater than 50 minutes in this encounter including coordination of her care, counseling the patient, personally reviewing imaging studies, placing orders and performing appropriate documentation ? ? ?Sterling Big, Mcdonald FACS ?General Surgeon ?

## 2021-03-27 ENCOUNTER — Ambulatory Visit: Admission: RE | Admit: 2021-03-27 | Payer: Medicaid Other | Source: Ambulatory Visit

## 2021-03-28 ENCOUNTER — Ambulatory Visit: Payer: Medicaid Other | Admitting: Gerontology

## 2021-05-06 ENCOUNTER — Inpatient Hospital Stay: Admission: RE | Admit: 2021-05-06 | Payer: Medicaid Other | Source: Ambulatory Visit

## 2021-05-09 ENCOUNTER — Other Ambulatory Visit: Payer: Self-pay

## 2021-05-09 DIAGNOSIS — Z122 Encounter for screening for malignant neoplasm of respiratory organs: Secondary | ICD-10-CM

## 2021-05-09 DIAGNOSIS — Z87891 Personal history of nicotine dependence: Secondary | ICD-10-CM

## 2021-05-09 DIAGNOSIS — F17200 Nicotine dependence, unspecified, uncomplicated: Secondary | ICD-10-CM

## 2021-05-21 ENCOUNTER — Ambulatory Visit: Payer: Self-pay

## 2021-06-19 ENCOUNTER — Ambulatory Visit: Payer: Self-pay | Admitting: Surgery

## 2021-06-26 ENCOUNTER — Encounter: Payer: Self-pay | Admitting: Surgery

## 2021-06-26 ENCOUNTER — Ambulatory Visit (INDEPENDENT_AMBULATORY_CARE_PROVIDER_SITE_OTHER): Payer: 59 | Admitting: Surgery

## 2021-06-26 VITALS — BP 151/97 | HR 87 | Temp 98.3°F | Wt 229.8 lb

## 2021-06-26 DIAGNOSIS — K449 Diaphragmatic hernia without obstruction or gangrene: Secondary | ICD-10-CM | POA: Diagnosis not present

## 2021-06-26 NOTE — Patient Instructions (Addendum)
Please schedule an appointment with your primary care physician about weight loss management.   We will call you in 6 months to schedule an appointment.    Hiatal Hernia  A hiatal hernia occurs when part of the stomach slides above the muscle that separates the abdomen from the chest (diaphragm). A person can be born with a hiatal hernia (congenital), or it may develop over time. In almost all cases of hiatal hernia, only the top part of the stomach pushes through the diaphragm. Many people have a hiatal hernia with no symptoms. The larger the hernia, the more likely it is that you will have symptoms. In some cases, a hiatal hernia allows stomach acid to flow back into the tube that carries food from your mouth to your stomach (esophagus). This may cause heartburn symptoms. Severe heartburn symptoms may mean that you have developed a condition called gastroesophageal reflux disease (GERD). What are the causes? This condition is caused by a weakness in the opening (hiatus) where the esophagus passes through the diaphragm to attach to the upper part of the stomach. A person may be born with a weakness in the hiatus, or a weakness can develop over time. What increases the risk? This condition is more likely to develop in: Older people. Age is a major risk factor for a hiatal hernia, especially if you are over the age of 30. Pregnant women. People who are overweight. People who have frequent constipation. What are the signs or symptoms? Symptoms of this condition usually develop in the form of GERD symptoms. Symptoms include: Heartburn. Belching. Indigestion. Trouble swallowing. Coughing or wheezing. Sore throat. Hoarseness. Chest pain. Nausea and vomiting. How is this diagnosed? This condition may be diagnosed during testing for GERD. Tests that may be done include: X-rays of your stomach or chest. An upper gastrointestinal (GI) series. This is an X-ray exam of your GI tract that is taken  after you swallow a chalky liquid that shows up clearly on the X-ray. Endoscopy. This is a procedure to look into your stomach using a thin, flexible tube that has a tiny camera and light on the end of it. How is this treated? This condition may be treated by: Dietary and lifestyle changes to help reduce GERD symptoms. Medicines. These may include: Over-the-counter antacids. Medicines that make your stomach empty more quickly. Medicines that block the production of stomach acid (H2 blockers). Stronger medicines to reduce stomach acid (proton pump inhibitors). Surgery to repair the hernia, if other treatments are not helping. If you have no symptoms, you may not need treatment. Follow these instructions at home: Lifestyle and activity Do not use any products that contain nicotine or tobacco, such as cigarettes and e-cigarettes. If you need help quitting, ask your health care provider. Try to achieve and maintain a healthy body weight. Avoid putting pressure on your abdomen. Anything that puts pressure on your abdomen increases the amount of acid that may be pushed up into your esophagus. Avoid bending over, especially after eating. Raise the head of your bed by putting blocks under the legs. This keeps your head and esophagus higher than your stomach. Do not wear tight clothing around your chest or stomach. Try not to strain when having a bowel movement, when urinating, or when lifting heavy objects. Eating and drinking Avoid foods that can worsen GERD symptoms. These may include: Fatty foods, like fried foods. Citrus fruits, like oranges or lemon. Other foods and drinks that contain acid, like orange juice or tomatoes. Spicy  food. Chocolate. Eat frequent small meals instead of three large meals a day. This helps prevent your stomach from getting too full. Eat slowly. Do not lie down right after eating. Do not eat 1-2 hours before bed. Do not drink beverages with caffeine. These  include cola, coffee, cocoa, and tea. Do not drink alcohol. General instructions Take over-the-counter and prescription medicines only as told by your health care provider. Keep all follow-up visits as told by your health care provider. This is important. Contact a health care provider if: Your symptoms are not controlled with medicines or lifestyle changes. You are having trouble swallowing. You have coughing or wheezing that will not go away. Get help right away if: Your pain is getting worse. Your pain spreads to your arms, neck, jaw, teeth, or back. You have shortness of breath. You sweat for no reason. You feel sick to your stomach (nauseous) or you vomit. You vomit blood. You have bright red blood in your stools. You have black, tarry stools. Summary A hiatal hernia occurs when part of the stomach slides above the muscle that separates the abdomen from the chest (diaphragm). A person may be born with a weakness in the hiatus, or a weakness can develop over time. Symptoms of hiatal hernia may include heartburn, trouble swallowing, or sore throat. Management of hiatal hernia includes eating frequent small meals instead of three large meals a day. Get help right away if you vomit blood, have bright red blood in your stools, or have black, tarry stools. This information is not intended to replace advice given to you by your health care provider. Make sure you discuss any questions you have with your health care provider. Document Revised: 11/13/2020 Document Reviewed: 12/01/2019 Elsevier Patient Education  [place].

## 2021-06-28 NOTE — Progress Notes (Signed)
Outpatient Surgical Follow Up  06/28/2021  Kimberly Mcdonald is an 56 y.o. female.   Chief Complaint  Patient presents with   Follow-up    Hiatal hernia    HPI: : 56 year old female with a significant history of reflux disease as well as a large paraesophageal hernia.  She does have some swallowing difficulty and feels pressures in her chest.  She did have a CT scan as well as an upper endoscopy and I have personally reviewed the images.  There is evidence of a type III paraesophageal hernia with about two thirds of the stomach within the mediastinum.  There was no strictures on EGD.  She does have a BMI of 40. She does have a history of gastritis and GI bleed presumably from [place] ulcers and gastritis. Found of EGD performed by Dr Timothy Lasso. CBC and bmp nml.  She did not complete her barium swallow, she has lost 10 lbs since the last visit  Past Medical History:  Diagnosis Date   Acid reflux 02/27/2021   Allergy    seasonal   Anemia due to blood loss 05/08/2019   Arthritis    knees and back   Cerebrovascular accident (CVA) (HCC)    HTN (hypertension)    Hyperlipidemia    Hypertension    Stroke [facility]    Substance abuse (HCC)    Marijuana smoker x35 yrs    Past Surgical History:  Procedure Laterality Date   ABDOMINAL SURGERY     BREAST BIOPSY Left 11/09/2020   stereo bx 11/09/2020-"coil" clip path pending   COLONOSCOPY WITH PROPOFOL N/A 04/05/2020   Procedure: COLONOSCOPY WITH PROPOFOL;  Surgeon: Toledo, Boykin Nearing, MD;  Location: [facility] ENDOSCOPY;  Service: Gastroenterology;  Laterality: N/A;   ENDARTERECTOMY Left 05/13/2019   Procedure: ENDARTERECTOMY CAROTID;  Surgeon: Renford Dills, MD;  Location: [facility] ORS;  Service: Vascular;  Laterality: Left;   ESOPHAGOGASTRODUODENOSCOPY (EGD) WITH PROPOFOL N/A 04/05/2020   Procedure: ESOPHAGOGASTRODUODENOSCOPY (EGD) WITH PROPOFOL;  Surgeon: Toledo, Boykin Nearing, MD;  Location: [facility] ENDOSCOPY;  Service: Gastroenterology;  Laterality:  N/A;   ESOPHAGOGASTRODUODENOSCOPY (EGD) WITH PROPOFOL N/A 09/22/2020   Procedure: ESOPHAGOGASTRODUODENOSCOPY (EGD) WITH PROPOFOL;  Surgeon: Jaynie Schulenburg, DO;  Location: [facility] ENDOSCOPY;  Service: Gastroenterology;  Laterality: N/A;   TUBAL LIGATION      Family History  Problem Relation Age of Onset   Diabetes Mother    Hypertension Mother    Cancer Father        leukemia   Alcohol abuse Sister    Breast cancer Neg Hx     Social History:  reports that she has been smoking cigarettes. She has never used smokeless tobacco. She reports that she does not currently use alcohol. She reports current drug use. Frequency: 3.00 times per week. Drug: Marijuana.  Allergies: No Known Allergies  Medications reviewed.    ROS Full ROS performed and is otherwise negative other than what is stated in HPI   BP (!) 151/97   Pulse 87   Temp 98.3 F (36.8 C) (Oral)   Wt 229 lb 12.8 oz (104.2 kg)   LMP 10/01/2016 (Approximate)   SpO2 95%   BMI 40.71 kg/m   Physical Exam Vitals and nursing note reviewed. Exam conducted with a chaperone present.  Constitutional:      General: She is not in acute distress.    Appearance: Normal appearance.  Neck:     Vascular: No carotid bruit.  Cardiovascular:     Rate and Rhythm: Normal rate and  regular rhythm.  Pulmonary:     Effort: Pulmonary effort is normal. No respiratory distress.     Breath sounds: Normal breath sounds. No stridor. No wheezing or rhonchi.  Abdominal:     General: Abdomen is flat. There is no distension.     Palpations: Abdomen is soft. There is no mass.     Tenderness: There is no abdominal tenderness. There is no guarding or rebound.     Hernia: No hernia is present.  Musculoskeletal:        General: No tenderness. Normal range of motion.     Cervical back: Normal range of motion and neck supple. No rigidity or tenderness.  Lymphadenopathy:     Cervical: No cervical adenopathy.  Skin:    General: Skin is warm and  dry.     Capillary Refill: Capillary refill takes less than 2 seconds.  Neurological:     General: No focal deficit present.     Mental Status: She is alert and oriented to person, place, and time.  Psychiatric:        Mood and Affect: Mood normal.        Behavior: Behavior normal.        Thought Content: Thought content normal.     Assessment/Plan: Paraesophageal hernia symptomatic in a pt w BMI 40. D/W pt in detail importance of weight optimization. She will need to complete barium swallow as well. I will see her in 6 months. I will be willing to contemplate surgery if she reaches 200 lbs. I have spent a total of 40 minutes minutes of face-to-face and non-face-to-face time, preparing to see the patient,performing a medically appropriate examination, counseling and educating the patient, ordering tests/procedures, referring and communicating with other health care professionals, documenting clinical information in the electronic health record, and care coordination   Sterling Big, MD [facility] General Surgeon

## 2021-10-08 ENCOUNTER — Ambulatory Visit: Payer: 59 | Admitting: Gerontology

## 2021-11-11 ENCOUNTER — Encounter (INDEPENDENT_AMBULATORY_CARE_PROVIDER_SITE_OTHER): Payer: Self-pay

## 2021-11-17 ENCOUNTER — Emergency Department
Admission: EM | Admit: 2021-11-17 | Discharge: 2021-11-18 | Disposition: A | Discharge status: Home / Self Care | Payer: 59 | Attending: Emergency Medicine | Admitting: Emergency Medicine

## 2021-11-17 ENCOUNTER — Other Ambulatory Visit: Payer: Self-pay

## 2021-11-17 ENCOUNTER — Encounter: Payer: Self-pay | Admitting: Intensive Care

## 2021-11-17 DIAGNOSIS — Z8379 Family history of other diseases of the digestive system: Secondary | ICD-10-CM

## 2021-11-17 DIAGNOSIS — R195 Other fecal abnormalities: Secondary | ICD-10-CM

## 2021-11-17 DIAGNOSIS — K921 Melena: Secondary | ICD-10-CM | POA: Diagnosis present

## 2021-11-17 LAB — CBC WITH DIFFERENTIAL/PLATELET
Abs Immature Granulocytes: 0.03 10*3/uL (ref 0.00–0.07)
Basophils Absolute: 0 10*3/uL (ref 0.0–0.1)
Basophils Relative: 0 %
Eosinophils Absolute: 0.1 10*3/uL (ref 0.0–0.5)
Eosinophils Relative: 1 %
HCT: 36.2 % (ref 36.0–46.0)
Hemoglobin: 11.4 g/dL — ABNORMAL LOW (ref 12.0–15.0)
Immature Granulocytes: 0 %
Lymphocytes Relative: 22 %
Lymphs Abs: 2.4 10*3/uL (ref 0.7–4.0)
MCH: 27.3 pg (ref 26.0–34.0)
MCHC: 31.5 g/dL (ref 30.0–36.0)
MCV: 86.8 fL (ref 80.0–100.0)
Monocytes Absolute: 0.9 10*3/uL (ref 0.1–1.0)
Monocytes Relative: 8 %
Neutro Abs: 7.5 10*3/uL (ref 1.7–7.7)
Neutrophils Relative %: 69 %
Platelets: 386 10*3/uL (ref 150–400)
RBC: 4.17 MIL/uL (ref 3.87–5.11)
RDW: 15.2 % (ref 11.5–15.5)
WBC: 11 10*3/uL — ABNORMAL HIGH (ref 4.0–10.5)
nRBC: 0 % (ref 0.0–0.2)

## 2021-11-17 LAB — COMPREHENSIVE METABOLIC PANEL
ALT: 8 U/L (ref 0–44)
AST: 13 U/L — ABNORMAL LOW (ref 15–41)
Albumin: 3.7 g/dL (ref 3.5–5.0)
Alkaline Phosphatase: 104 U/L (ref 38–126)
Anion gap: 5 (ref 5–15)
BUN: 15 mg/dL (ref 6–20)
CO2: 24 mmol/L (ref 22–32)
Calcium: 9.3 mg/dL (ref 8.9–10.3)
Chloride: 111 mmol/L (ref 98–111)
Creatinine, Ser: 0.81 mg/dL (ref 0.44–1.00)
GFR, Estimated: >60 mL/min (ref >60)
Glucose, Bld: 103 mg/dL — ABNORMAL HIGH (ref 70–99)
Potassium: 3.8 mmol/L (ref 3.5–5.1)
Sodium: 140 mmol/L (ref 135–145)
Total Bilirubin: 0.9 mg/dL (ref 0.3–1.2)
Total Protein: 7.9 g/dL (ref 6.5–8.1)

## 2021-11-17 MED ORDER — PANTOPRAZOLE SODIUM 40 MG PO TBEC: 40.0000 mg | DELAYED_RELEASE_TABLET | Freq: Every day | ORAL | 0 refills | Status: DC

## 2021-11-17 NOTE — ED Provider Notes (Signed)
[facility] Provider Note    Event Date/Time   First MD Initiated Contact with Patient 11/17/21 2258     (approximate)   History   Rectal Bleeding   HPI  Tomeca Helm is a 56 y.o. female who presents for evaluation of dark stools.  She says she was worried because she has a history of anemia.  She has had an endoscopy and a colonoscopy within the last year or so (both in 2022).  She notes that she has not had any pain.  She has not been taking her iron supplement like she is supposed to, and she has not been taking her Protonix because she ran out and did not realize it was available over-the-counter.  However she started taking an iron supplement again yesterday and then noticed to dark stools today.  However after she had the dark stools, she had a stool that was a little bit dark and then went back to normal and now her stool seems to be a normal color.  She just wanted to get checked out to make sure she is okay.  She has not been excessively tired lately and denies fever, chest pain, shortness of breath, nausea, vomiting, and abdominal pain.     Physical Exam   Triage Vital Signs: ED Triage Vitals  Enc Vitals Group     BP 11/17/21 1726 (!) 158/107     Pulse Rate 11/17/21 1726 97     Resp 11/17/21 1726 16     Temp 11/17/21 1726 98.4 F (36.9 C)     Temp Source 11/17/21 1726 Oral     SpO2 11/17/21 1726 95 %     Weight 11/17/21 1659 104.3 kg (230 lb)     Height 11/17/21 1659 1.6 m (5\' 3" )     Head Circumference --      Peak Flow --      Pain Score 11/17/21 1659 0     Pain Loc --      Pain Edu? --      Excl. in [place]? --     Most recent vital signs: Vitals:   11/17/21 1726 11/17/21 2344  BP: (!) 158/107 (!) 160/99  Pulse: 97 84  Resp: 16 15  Temp: 98.4 F (36.9 C) 98 F (36.7 C)  SpO2: 95% 100%     General: Awake, no distress.  CV:  Good peripheral perfusion.  Resp:  Normal effort.  Abd:  No distention.  No tenderness to  palpation.  Deferred rectal exam. Other:  Awake, alert, acting appropriate, oriented, pleasant and conversant.   ED Results / Procedures / Treatments   Labs (all labs ordered are listed, but only abnormal results are displayed) Labs Reviewed  CBC WITH DIFFERENTIAL/PLATELET - Abnormal; Notable for the following components:      Result Value   WBC 11.0 (*)    Hemoglobin 11.4 (*)    All other components within normal limits  COMPREHENSIVE METABOLIC PANEL - Abnormal; Notable for the following components:   Glucose, Bld 103 (*)    AST 13 (*)    All other components within normal limits     PROCEDURES:  Critical Care performed: No  Procedures   MEDICATIONS ORDERED IN ED: Medications - No data to display   IMPRESSION / MDM / National / ED COURSE  I reviewed the triage vital signs and the nursing notes.  Differential diagnosis includes, but is not limited to, discolored stool due to iron supplementation, upper GI bleed, lower GI bleed, medication or drug side effect, anemia (acute versus chronic), AV malformation, neoplasm, hemorrhoids.  Patient's presentation is most consistent with acute complicated illness / injury requiring diagnostic workup.  Labs/studies ordered: CMP, CBC with differential.  Labs are reassuring with a hemoglobin of 11.4 which is higher than it has been in the past.  Metabolic panel is also within normal limits.  No clinical signs or symptoms of symptomatic anemia.Patient has hypertension but is on medication and has been in the emergency department for more than 7 hours not taking her medication.  She is asymptomatic from the hypertension.    She has had no pain or vomiting.  I reviewed the medical record including reading the reports from her endoscopy on 09/24/2020 and 09/22/2020 and her colonoscopy on 04/05/2020.  The reports were encouraging with no severe abnormalities that would be worrisome for new bleeding or new  life-threatening illness within about a year.  The patient was already reassured that she was having normal stools that were no longer dark-colored and I think it is very likely it was because she took an iron supplement after not being on them for a while.  I reminded her the Protonix is available over-the-counter and I also refilled the prescription that she had in the system so that she can help find it at the store.  We talked about and decided to defer rectal exam tonight as it is unlikely to change the plan for discharge and outpatient follow-up.  Before I fully assessed her history and performed H&P, I had considered hospitalization if she is truly having rectal bleeding, but I think it is unlikely at this point and even if she has a very minimal GI bleed or Hemoccult positive stool, given that she just recently had endoscopies, she will not require hospitalization at this time.  She is very comfortable with the plan for outpatient follow-[facility] and I also gave her the name and number of Dr. Norma Fredrickson, who performed one of her prescription.  She will take her medications and return to the emergency department if she develops new or worsening symptoms.       FINAL CLINICAL IMPRESSION(S) / ED DIAGNOSES   Final diagnoses:  Dark stools     Rx / DC Orders   ED Discharge Orders          Ordered    pantoprazole (PROTONIX) 40 MG tablet  Daily        11/17/21 2315             Note:  This document was prepared using Dragon voice recognition software and may include unintentional dictation errors.   Loleta Rose, MD 11/17/21 2355

## 2021-11-17 NOTE — ED Provider Triage Note (Signed)
Emergency Medicine Provider Triage Evaluation Note  Kimberly Mcdonald , a 56 y.o. female  was evaluated in triage.  Pt complains of dark tarry stools and diarrhea. She has a history of GI bleed and anemia.  Review of Systems  Positive: Dark stools Negative: [place]  Physical Exam  BP (!) 158/107 (BP Location: Right Arm)   Pulse 97   Temp 98.4 F (36.9 C) (Oral)   Resp 16   Ht 5\' 3"  (1.6 m)   Wt 104.3 kg   LMP 10/01/2016 (Approximate)   SpO2 95%   BMI 40.74 kg/m  Gen:   Awake, no distress  NAD Resp:  Normal effort CTA MSK:   Moves extremities without difficulty  Other:    Medical Decision Making  Medically screening exam initiated at 5:36 PM.  Appropriate orders placed.  Elisha Ponder was informed that the remainder of the evaluation will be completed by another provider, this initial triage assessment does not replace that evaluation, and the importance of remaining in the ED until their evaluation is complete.  Patient with a history of GI bleed presents to the ED with some dark tarry stools and some loose stools today.   Melvenia Needles, PA-C 11/17/21 1739

## 2021-11-17 NOTE — Discharge Instructions (Signed)
As we discussed, your evaluation today in the emergency department was reassuring, with a hemoglobin that is almost normal.  Given that you have had an endoscopy and a colonoscopy within about a year, you likely should be fine following up with your primary care provider.  However if you would like to follow-up with Dr. Alice Reichert, who performed your colonoscopy in 2022, you can call his office to schedule follow-up appointment.  In the meantime, continue to take your regular medications including your iron supplement and your Protonix (pantoprazole), which of note is available over-the-counter without a prescription, but we included a prescription to help you find the medication at the store.  Follow-up as an outpatient in clinic, and return to the emergency department if you develop new or worsening symptoms that concern you.

## 2021-11-17 NOTE — ED Triage Notes (Signed)
Patient reports two episodes of rectal bleeding today with BM. History anemia. Denies pain. Denies any other symptoms

## 2022-09-13 IMAGING — MG DIGITAL DIAGNOSTIC BILAT W/ TOMO W/ CAD
8 series · 8 of 20 positions shown · non-contrast
Comparison: Baseline mammogram 04/18/2020.

CLINICAL DATA: Recall from baseline screening mammography, possible
mass involving the UPPER INNER QUADRANT of the RIGHT breast at
MIDDLE depth and calcifications involving the UPPER INNER QUADRANT
of the LEFT breast at MIDDLE depth.



[L CC]
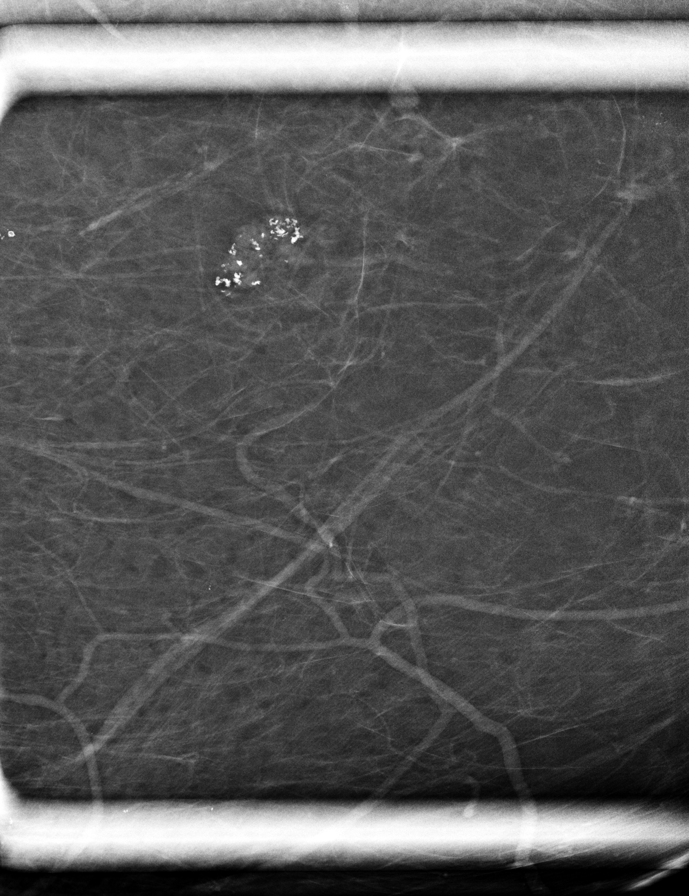

[L ML]
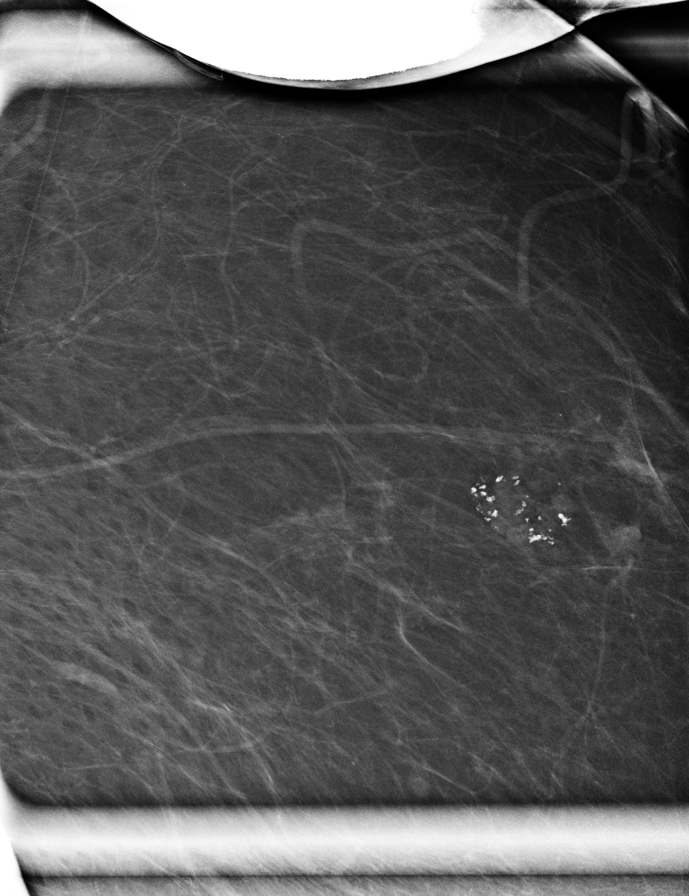

[R MLO synth-2D]
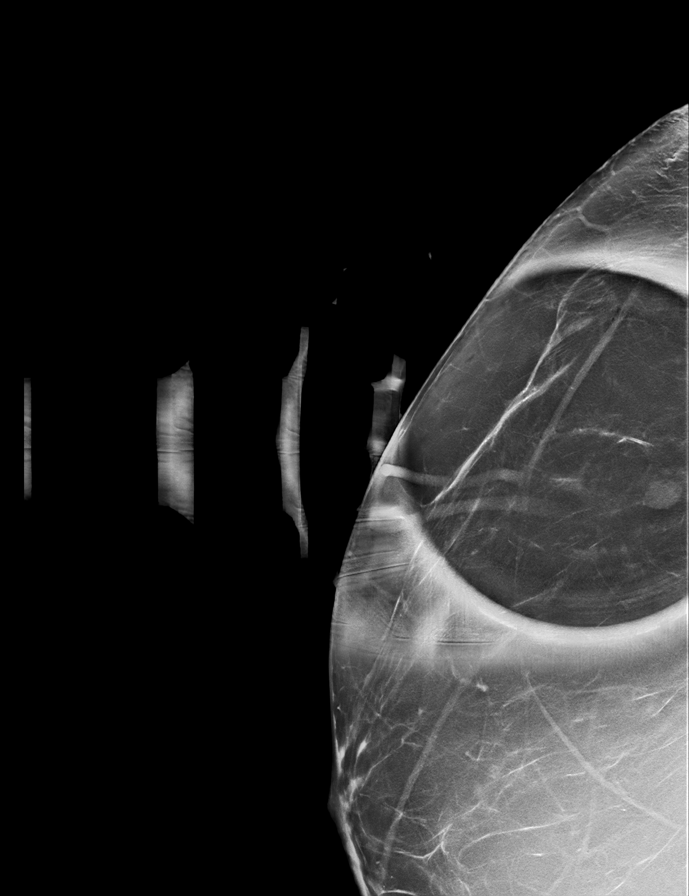

[L ML synth-2D]
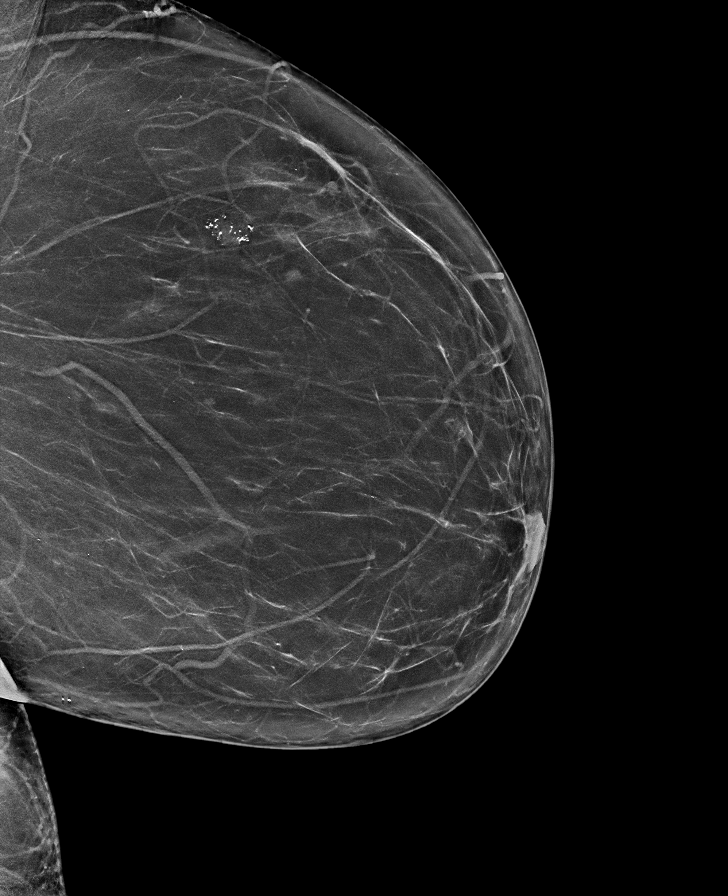

[R CC synth-2D]
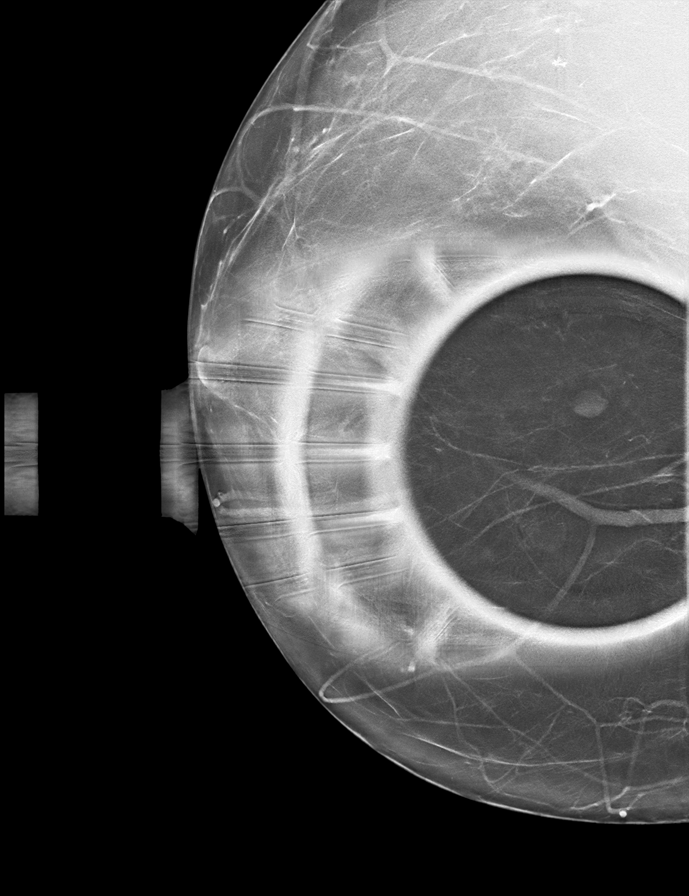

[R MLO tomo · tomo slice 35/70.0]
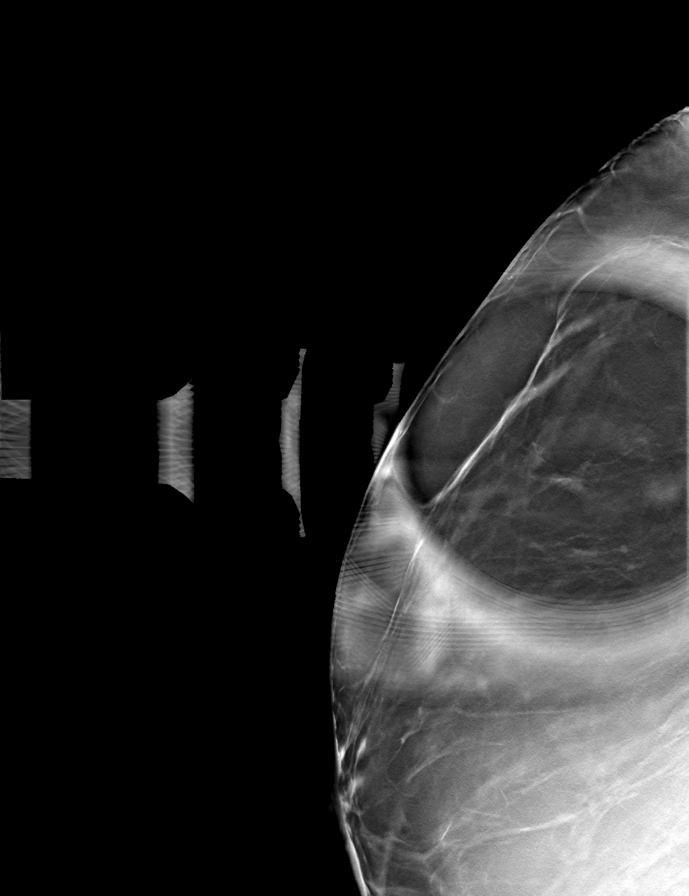

[R CC tomo · tomo slice 32/63.0]
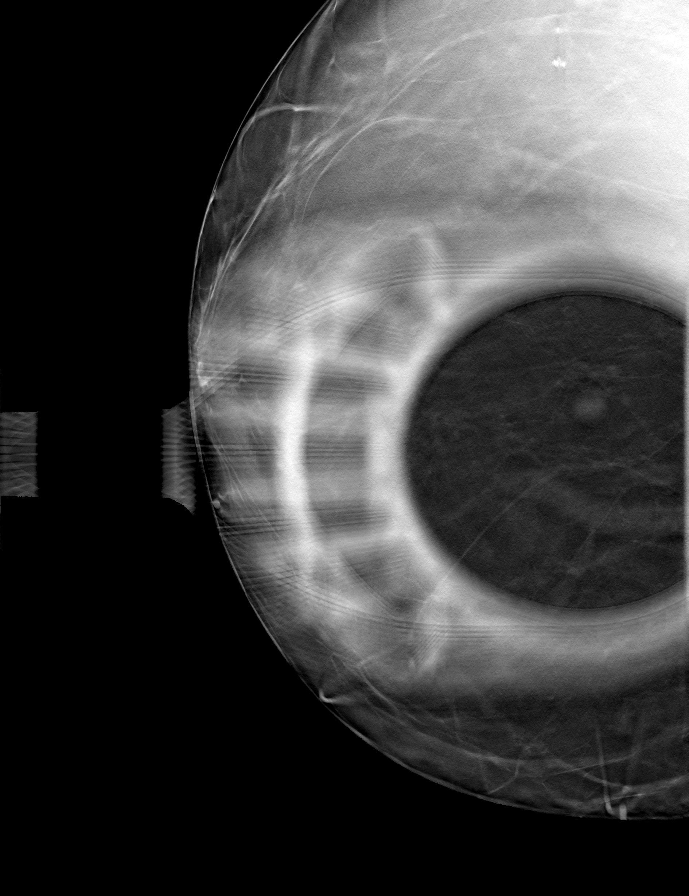

[L ML tomo · tomo slice 37/72.0]
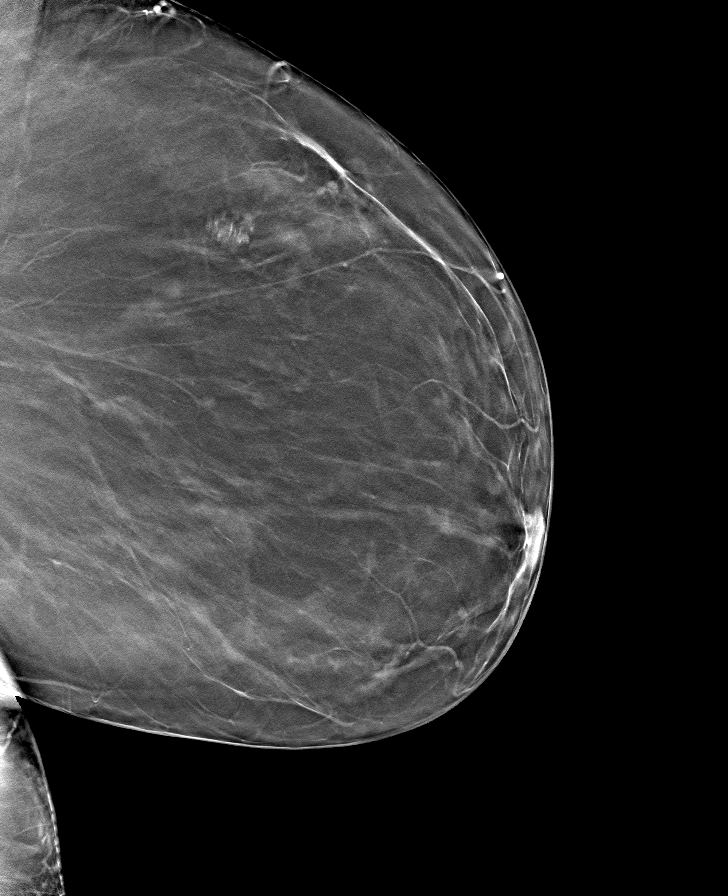

[8 of 20 positions shown; findings below may reference images not displayed]

No prior ultrasound.

ACR Breast Density Category b: There are scattered areas of
fibroglandular density.
FINDINGS: Spot-compression CC and MLO views of the area of concern in the
RIGHT breast, spot magnification CC and mediolateral views of the
LEFT breast calcifications and a full field mediolateral view of the
LEFT breast were obtained.

RIGHT: Spot compression images confirm a circumscribed isodense mass
with possible central fat measuring approximately 9 mm in the UPPER
INNER QUADRANT at middle depth. There is no associated architectural
distortion or suspicious calcifications.

Targeted ultrasound is performed, demonstrating an oval parallel
circumscribed nearly isoechoic mass at the 1 o'clock position
approximately 9 cm from the nipple measuring approximately 0.8 x
x 0.8 cm, demonstrating no posterior characteristics and no internal
power Doppler flow, corresponding to the screening mammographic
finding.

LEFT: Spot magnification views confirm a group of coarse
heterogeneous calcifications associated with a mass or focal
asymmetry in the UPPER INNER QUADRANT at middle depth. The
calcifications span approximately 1.5 x 0.8 x 1.1 cm. There is no
associated architectural distortion.

Targeted ultrasound is performed, demonstrating the shadowing
calcifications at the 11 o'clock position approximately 8 cm from
the nipple, though there is no associated mass. The focal asymmetry
containing with calcifications measures approximately 1.5 x 0.8 x
1.3 cm, corresponding well with the mammographic measurements of the
focal asymmetry. There is no internal power Doppler flow within the
focal asymmetry.
IMPRESSION: 1. Indeterminate 1.5 cm group of coarse heterogeneous calcifications
associated with a focal asymmetry in the UPPER INNER QUADRANT of the
LEFT breast. There is no associated mass on ultrasound.
2. Likely benign 8 mm mass involving the UPPER INNER QUADRANT of the
RIGHT breast, possibly a fibroadenoma.

RECOMMENDATION:
1. Stereotactic tomosynthesis core needle biopsy of the
indeterminate LEFT breast calcifications associated with a focal
asymmetry.
2. If the stereotactic biopsy demonstrates benign pathology,
six-month diagnostic RIGHT mammogram and RIGHT breast ultrasound is
recommended to follow-up the likely benign mass.
3. If the stereotactic biopsy demonstrates malignant pathology, then
ultrasound-guided core needle biopsy of the RIGHT breast mass is
recommended to establish the diagnosis prior to surgery.

The stereotactic tomosynthesis core needle biopsy procedure was
discussed with the patient and her questions were answered. She
wishes to proceed with the biopsy. She will be contacted by the
Breast Navigator at [[email or web address]] in order to schedule the
biopsy.

I have discussed the findings and recommendations with the patient.

BI-RADS CATEGORY  4: Suspicious.

## 2023-08-26 ENCOUNTER — Other Ambulatory Visit: Payer: Self-pay | Admitting: Physician Assistant

## 2023-08-26 DIAGNOSIS — Z1231 Encounter for screening mammogram for malignant neoplasm of breast: Secondary | ICD-10-CM

## 2023-12-01 ENCOUNTER — Encounter: Payer: Self-pay | Admitting: Internal Medicine

## 2023-12-01 ENCOUNTER — Inpatient Hospital Stay
Admission: EM | Admit: 2023-12-01 | Discharge: 2023-12-03 | DRG: 811 | Disposition: A | Discharge status: Home / Self Care | Attending: Internal Medicine | Admitting: Internal Medicine

## 2023-12-01 ENCOUNTER — Other Ambulatory Visit: Payer: Self-pay

## 2023-12-01 DIAGNOSIS — Z720 Tobacco use: Secondary | ICD-10-CM | POA: Diagnosis present

## 2023-12-01 DIAGNOSIS — K5730 Diverticulosis of large intestine without perforation or abscess without bleeding: Secondary | ICD-10-CM | POA: Diagnosis present

## 2023-12-01 DIAGNOSIS — I10 Essential (primary) hypertension: Secondary | ICD-10-CM | POA: Diagnosis present

## 2023-12-01 DIAGNOSIS — K922 Gastrointestinal hemorrhage, unspecified: Secondary | ICD-10-CM | POA: Diagnosis not present

## 2023-12-01 DIAGNOSIS — D62 Acute posthemorrhagic anemia: Principal | ICD-10-CM | POA: Diagnosis present

## 2023-12-01 DIAGNOSIS — Z791 Long term (current) use of non-steroidal anti-inflammatories (NSAID): Secondary | ICD-10-CM

## 2023-12-01 DIAGNOSIS — K254 Chronic or unspecified gastric ulcer with hemorrhage: Secondary | ICD-10-CM | POA: Diagnosis present

## 2023-12-01 DIAGNOSIS — E785 Hyperlipidemia, unspecified: Secondary | ICD-10-CM | POA: Diagnosis not present

## 2023-12-01 DIAGNOSIS — Z833 Family history of diabetes mellitus: Secondary | ICD-10-CM

## 2023-12-01 DIAGNOSIS — Z8673 Personal history of transient ischemic attack (TIA), and cerebral infarction without residual deficits: Secondary | ICD-10-CM

## 2023-12-01 DIAGNOSIS — D509 Iron deficiency anemia, unspecified: Secondary | ICD-10-CM | POA: Diagnosis present

## 2023-12-01 DIAGNOSIS — K449 Diaphragmatic hernia without obstruction or gangrene: Secondary | ICD-10-CM | POA: Diagnosis present

## 2023-12-01 DIAGNOSIS — I5032 Chronic diastolic (congestive) heart failure: Secondary | ICD-10-CM | POA: Diagnosis present

## 2023-12-01 DIAGNOSIS — Z79899 Other long term (current) drug therapy: Secondary | ICD-10-CM

## 2023-12-01 DIAGNOSIS — R531 Weakness: Principal | ICD-10-CM

## 2023-12-01 DIAGNOSIS — Z7982 Long term (current) use of aspirin: Secondary | ICD-10-CM

## 2023-12-01 DIAGNOSIS — K640 First degree hemorrhoids: Secondary | ICD-10-CM | POA: Diagnosis present

## 2023-12-01 DIAGNOSIS — K3189 Other diseases of stomach and duodenum: Secondary | ICD-10-CM | POA: Diagnosis present

## 2023-12-01 DIAGNOSIS — Z8379 Family history of other diseases of the digestive system: Secondary | ICD-10-CM

## 2023-12-01 DIAGNOSIS — I110 Hypertensive heart disease with heart failure: Secondary | ICD-10-CM | POA: Diagnosis present

## 2023-12-01 DIAGNOSIS — Z806 Family history of leukemia: Secondary | ICD-10-CM

## 2023-12-01 DIAGNOSIS — K219 Gastro-esophageal reflux disease without esophagitis: Secondary | ICD-10-CM | POA: Diagnosis present

## 2023-12-01 DIAGNOSIS — D649 Anemia, unspecified: Secondary | ICD-10-CM

## 2023-12-01 DIAGNOSIS — F17210 Nicotine dependence, cigarettes, uncomplicated: Secondary | ICD-10-CM | POA: Diagnosis present

## 2023-12-01 DIAGNOSIS — Z8249 Family history of ischemic heart disease and other diseases of the circulatory system: Secondary | ICD-10-CM

## 2023-12-01 DIAGNOSIS — Z6841 Body Mass Index (BMI) 40.0 and over, adult: Secondary | ICD-10-CM

## 2023-12-01 DIAGNOSIS — I639 Cerebral infarction, unspecified: Secondary | ICD-10-CM | POA: Diagnosis present

## 2023-12-01 DIAGNOSIS — E66813 Obesity, class 3: Secondary | ICD-10-CM | POA: Diagnosis present

## 2023-12-01 LAB — COMPREHENSIVE METABOLIC PANEL WITH GFR
ALT: 7 U/L (ref 0–44)
AST: 17 U/L (ref 15–41)
Albumin: 4 g/dL (ref 3.5–5.0)
Alkaline Phosphatase: 120 U/L (ref 38–126)
Anion gap: 12 (ref 5–15)
BUN: 15 mg/dL (ref 6–20)
CO2: 25 mmol/L (ref 22–32)
Calcium: 9.5 mg/dL (ref 8.9–10.3)
Chloride: 103 mmol/L (ref 98–111)
Creatinine, Ser: 0.83 mg/dL (ref 0.44–1.00)
GFR, Estimated: >60 mL/min (ref >60)
Glucose, Bld: 146 mg/dL — ABNORMAL HIGH (ref 70–99)
Potassium: 3.6 mmol/L (ref 3.5–5.1)
Sodium: 140 mmol/L (ref 135–145)
Total Bilirubin: 0.6 mg/dL (ref 0.0–1.2)
Total Protein: 7.4 g/dL (ref 6.5–8.1)

## 2023-12-01 LAB — CBC
HCT: 27.4 % — ABNORMAL LOW (ref 36.0–46.0)
HCT: 24.8 % — ABNORMAL LOW (ref 36.0–46.0)
Hemoglobin: 7.5 g/dL — ABNORMAL LOW (ref 12.0–15.0)
Hemoglobin: 6.6 g/dL — ABNORMAL LOW (ref 12.0–15.0)
MCH: 20.4 pg — ABNORMAL LOW (ref 26.0–34.0)
MCH: 19.8 pg — ABNORMAL LOW (ref 26.0–34.0)
MCHC: 27.4 g/dL — ABNORMAL LOW (ref 30.0–36.0)
MCHC: 26.6 g/dL — ABNORMAL LOW (ref 30.0–36.0)
MCV: 74.5 fL — ABNORMAL LOW (ref 80.0–100.0)
MCV: 74.5 fL — ABNORMAL LOW (ref 80.0–100.0)
Platelets: 264 K/uL (ref 150–400)
Platelets: 265 K/uL (ref 150–400)
RBC: 3.68 MIL/uL — ABNORMAL LOW (ref 3.87–5.11)
RBC: 3.33 MIL/uL — ABNORMAL LOW (ref 3.87–5.11)
RDW: 20.1 % — ABNORMAL HIGH (ref 11.5–15.5)
RDW: 20.1 % — ABNORMAL HIGH (ref 11.5–15.5)
WBC: 8.2 K/uL (ref 4.0–10.5)
WBC: 8 K/uL (ref 4.0–10.5)
nRBC: 0.2 % (ref 0.0–0.2)
nRBC: 0 % (ref 0.0–0.2)

## 2023-12-01 LAB — RETICULOCYTES
Immature Retic Fract: 28.6 % — ABNORMAL HIGH (ref 2.3–15.9)
RBC.: 3.28 MIL/uL — ABNORMAL LOW (ref 3.87–5.11)
Retic Count, Absolute: 84.6 K/uL (ref 19.0–186.0)
Retic Ct Pct: 2.6 % (ref 0.4–3.1)

## 2023-12-01 LAB — IRON AND TIBC
Iron: 11 ug/dL — ABNORMAL LOW (ref 28–170)
Saturation Ratios: 2 % — ABNORMAL LOW (ref 10.4–31.8)
TIBC: 588 ug/dL — ABNORMAL HIGH (ref 250–450)
UIBC: 577 ug/dL

## 2023-12-01 LAB — APTT: aPTT: 28 s (ref 24–36)

## 2023-12-01 LAB — PROTIME-INR
INR: 0.9 (ref 0.8–1.2)
Prothrombin Time: 13.1 s (ref 11.4–15.2)

## 2023-12-01 LAB — PRO BRAIN NATRIURETIC PEPTIDE: Pro Brain Natriuretic Peptide: <50 pg/mL (ref <300.0)

## 2023-12-01 LAB — PREPARE RBC (CROSSMATCH)

## 2023-12-01 LAB — FERRITIN: Ferritin: 8 ng/mL — ABNORMAL LOW (ref 11–307)

## 2023-12-01 LAB — FOLATE: Folate: 6.2 ng/mL (ref >5.9)

## 2023-12-01 MED ORDER — HYDRALAZINE HCL 20 MG/ML IJ SOLN: 5.0000 mg | INTRAMUSCULAR | Status: DC | PRN

## 2023-12-01 MED ORDER — PANTOPRAZOLE SODIUM 40 MG IV SOLR
40.0000 mg | INTRAVENOUS | Status: AC
  Administered 2023-12-01 (×2): 40 mg via INTRAVENOUS
  Filled 2023-12-01 (×2): qty 10

## 2023-12-01 MED ORDER — ACETAMINOPHEN 325 MG PO TABS
650.0000 mg | ORAL_TABLET | Freq: Four times a day (QID) | ORAL | Status: DC | PRN
  Administered 2023-12-02 – 2023-12-03 (×3): 650 mg via ORAL
  Filled 2023-12-01 (×3): qty 2

## 2023-12-01 MED ORDER — AMLODIPINE BESYLATE 10 MG PO TABS: 10.0000 mg | ORAL_TABLET | Freq: Every day | ORAL | Status: DC

## 2023-12-01 MED ORDER — VITAMIN D 25 MCG (1000 UNIT) PO TABS
1000.0000 [IU] | ORAL_TABLET | Freq: Every day | ORAL | Status: DC
  Administered 2023-12-02 – 2023-12-03 (×2): 1000 [IU] via ORAL
  Filled 2023-12-01 (×2): qty 1

## 2023-12-01 MED ORDER — PANTOPRAZOLE SODIUM 40 MG IV SOLR
40.0000 mg | Freq: Two times a day (BID) | INTRAVENOUS | Status: DC
  Administered 2023-12-02 – 2023-12-03 (×3): 40 mg via INTRAVENOUS
  Filled 2023-12-01 (×3): qty 10

## 2023-12-01 MED ORDER — SODIUM CHLORIDE 0.9 % IV BOLUS
500.0000 mL | Freq: Once | INTRAVENOUS | Status: AC
  Administered 2023-12-01: 500 mL via INTRAVENOUS

## 2023-12-01 MED ORDER — SODIUM CHLORIDE 0.9 % IV BOLUS: 500.0000 mL | Freq: Once | INTRAVENOUS | Status: DC

## 2023-12-01 MED ORDER — NICOTINE 21 MG/24HR TD PT24
21.0000 mg | MEDICATED_PATCH | Freq: Every day | TRANSDERMAL | Status: DC
  Administered 2023-12-01 – 2023-12-02 (×2): 21 mg via TRANSDERMAL
  Filled 2023-12-01 (×2): qty 1

## 2023-12-01 MED ORDER — ATORVASTATIN CALCIUM 20 MG PO TABS
40.0000 mg | ORAL_TABLET | Freq: Every day | ORAL | Status: DC
  Administered 2023-12-02 – 2023-12-03 (×2): 40 mg via ORAL
  Filled 2023-12-01 (×2): qty 2

## 2023-12-01 MED ORDER — ALBUTEROL SULFATE (2.5 MG/3ML) 0.083% IN NEBU: 3.0000 mL | INHALATION_SOLUTION | RESPIRATORY_TRACT | Status: DC | PRN

## 2023-12-01 MED ORDER — FERROUS SULFATE 325 (65 FE) MG PO TABS
325.0000 mg | ORAL_TABLET | Freq: Every day | ORAL | Status: DC
  Administered 2023-12-02 – 2023-12-03 (×2): 325 mg via ORAL
  Filled 2023-12-01 (×2): qty 1

## 2023-12-01 MED ORDER — SODIUM CHLORIDE 0.9% IV SOLUTION: Freq: Once | INTRAVENOUS | Status: AC

## 2023-12-01 MED ORDER — DM-GUAIFENESIN ER 30-600 MG PO TB12: 1.0000 | ORAL_TABLET | Freq: Two times a day (BID) | ORAL | Status: DC | PRN

## 2023-12-01 MED ORDER — ONDANSETRON HCL 4 MG/2ML IJ SOLN: 4.0000 mg | Freq: Three times a day (TID) | INTRAMUSCULAR | Status: DC | PRN

## 2023-12-01 NOTE — ED Provider Notes (Signed)
 [facility] Provider Note    Event Date/Time   First MD Initiated Contact with Patient 12/01/23 1629     (approximate)  History   Chief Complaint: Abnormal Lab  HPI  Christena Sunderlin is a 58 y.o. female with a past medical history of anemia, arthritis, hypertension, hyperlipidemia, prior CVA, presents to the emergency department for low blood count.  According to the patient she saw her PCP yesterday, states she has been feeling more weak over the last several weeks.  Patient was called back today saying that her blood counts were low and she needed to come to the emergency department.  Patient states this has happened in 2021 as well as 2022 and the patient had to have blood transfusions but they were never able to localize where the blood went per patient.  Patient denies any blood thinners.  Patient denies any abdominal pain.  Patient states she did notice 1 episode of darker stool but takes an iron  supplement.  Patient denies any vomiting.  Physical Exam   Triage Vital Signs: ED Triage Vitals  Encounter Vitals Group     BP 12/01/23 1506 (!) 148/78     Girls Systolic BP Percentile --      Girls Diastolic BP Percentile --      Boys Systolic BP Percentile --      Boys Diastolic BP Percentile --      Pulse Rate 12/01/23 1506 (!) 102     Resp 12/01/23 1506 20     Temp 12/01/23 1506 98.1 F (36.7 C)     Temp Source 12/01/23 1506 Oral     SpO2 12/01/23 1506 100 %     Weight 12/01/23 1508 246 lb (111.6 kg)     Height 12/01/23 1508 5' 3 (1.6 m)     Head Circumference --      Peak Flow --      Pain Score 12/01/23 1508 0     Pain Loc --      Pain Education --      Exclude from Growth Chart --     Most recent vital signs: Vitals:   12/01/23 1620 12/01/23 1630  BP:  119/70  Pulse: 88 87  Resp:  20  Temp:    SpO2: 98% 97%    General: Awake, no distress.  CV:  Good peripheral perfusion.  Regular rate and rhythm  Resp:  Normal effort.  Equal  breath sounds bilaterally.  Abd:  No distention.  Soft, nontender.  No rebound or guarding. Other:  Rectal examination shows minimal stool but guaiac negative mucus.   ED Results / Procedures / Treatments   MEDICATIONS ORDERED IN ED: Medications - No data to display   IMPRESSION / MDM / ASSESSMENT AND PLAN / ED COURSE  I reviewed the triage vital signs and the nursing notes.  Patient's presentation is most consistent with acute presentation with potential threat to life or bodily function.  Patient presents to the emergency department for low blood counts.  In the emergency department today patient's lab work shows a hemoglobin of 7.5 down from 11.42 years ago.  Patient's MCV mildly low at 74.5 possibly indicating iron  deficiency.  Patient's chemistry shows no significant findings.  Patient's rectal exam showed no stool but guaiac negative mucus.  Given the patient's dark stool she was seen along with significant drop in hemoglobin and now symptomatic over the last 2 weeks with weakness and shortness of breath with exertion we will  discuss with GI medicine, patient may require admission for further workup treatment and IV Protonix  until a more clear cause for her anemia can be found.  At 7.5 with no active bleeding patient is borderline for a transfusion.  Will discuss with GI medicine.  I spoke with Dr. Maryruth of GI medicine, given the patient's 4 point hemoglobin drop we will admit to the hospitalist service we will start the patient on IV Protonix .  Patient agreeable to plan of care.  FINAL CLINICAL IMPRESSION(S) / ED DIAGNOSES   Symptomatic anemia   Note:  This document was prepared using Dragon voice recognition software and may include unintentional dictation errors.   Dorothyann Drivers, MD 12/01/23 [phone]

## 2023-12-01 NOTE — H&P (Signed)
 History and Physical    Kimberly Mcdonald FMW:[id number] DOB: October 14, 1965 DOA: 12/01/2023  Referring MD/NP/PA:   PCP: System, Provider Not In   Patient coming from:  The patient is coming from home.     Chief Complaint: Dark stool, fatigue, SOB, low hemoglobin  HPI: Kimberly Mcdonald is a 58 y.o. female with medical history significant of upper GIB, HTN, HLD, dCHF, stroke, iron  deficiency anemia, GERD, tobacco abuse, obesity, carotid artery stenosis, who presents with dark stool, fatigue, SOB, low hemoglobin.  Patient states that she has fatigue, dizzy and exertional SOB for more than 2 weeks.  No fall.  She reports 1 episode of dark stool last week, currently no rectal bleeding or dark stool.  No nausea, vomiting, diarrhea or abdominal pain.  No chest pain or cough.  No fever or chills.  No symptoms of UTI.  Patient states that she is not taking iron  supplement.  She is taking 81 mg aspirin  daily.  Mobic  is on her medication list, but she states that she is not taking these medications. Her PCP did lab yesterday which showed low Hgb level of 6.4 per patient. Pt is recommended to come to the hospital for further evaluation and treatment.  Of note, pt had hx of upper GIB 2021 and 2022. Pt had EGD and colonoscopy 2022, but no clear source of GI bleeding identified.  EGD showed erosive gastropathy with no bleeding or stigmata of bleeding.   Colonoscopy on 04/05/2020 - Non- bleeding internal hemorrhoids.  - Mild diverticulosis in the entire examined colon. There was no evidence of diverticular bleeding.  - No specimens collected.  EGD on 09/22/20:  Impression:  - Normal duodenal bulb, first portion of the duodenum and second portion of the duodenum. - Large hiatal hernia.  - Erosive gastropathy with no bleeding and no stigmata of recent bleeding.  - Normal mucosa was found in the entire stomach. Biopsied.  - Esophagogastric landmarks identified.   Data reviewed independently and  ED Course: pt was found to have  Hgb 11.4 on 11/17/21 --> 7.5 --> then 6.6 today, WBC 8.2, GFR> 60.  Temperature normal, blood pressure 141/73, heart rate 102 --> 77, RR 20, oxygen saturation 100% on room air.  Patient is placed in telemetry bed for observation. Dr. Maryruth for GI is consulted by EDP.   EKG: I have personally reviewed.  Sinus rhythm, QTc 449, mild T wave inversion in inferior leads and V3-V5, early R wave progression.   Review of Systems:   General: no fevers, chills, no body weight gain, has fatigue HEENT: no blurry vision, hearing changes or sore throat Respiratory: has dyspnea, no coughing, wheezing CV: no chest pain, no palpitations GI: no nausea, vomiting, abdominal pain, diarrhea, constipation. has dark stool GU: no dysuria, burning on urination, increased urinary frequency, hematuria  Ext: no leg edema Neuro: no unilateral weakness, numbness, or tingling, no vision change or hearing loss Skin: no rash, no skin tear. MSK: No muscle spasm, no deformity, no limitation of range of movement in spin Heme: No easy bruising.  Travel history: No recent long distant travel.   Allergy: No Known Allergies  Past Medical History:  Diagnosis Date   Acid reflux 02/27/2021   Allergy    seasonal   Anemia due to blood loss 05/08/2019   Arthritis    knees and back   Cerebrovascular accident (CVA) (HCC)    HTN (hypertension)    Hyperlipidemia    Hypertension    Stroke (HCC)  Substance abuse (HCC)    Marijuana smoker x35 yrs    Past Surgical History:  Procedure Laterality Date   ABDOMINAL SURGERY     BREAST BIOPSY Left 11/09/2020   stereo bx 11/09/2020-coil clip path pending   COLONOSCOPY WITH PROPOFOL  N/A 04/05/2020   Procedure: COLONOSCOPY WITH PROPOFOL ;  Surgeon: Toledo, Ladell POUR, MD;  Location: [facility] ENDOSCOPY;  Service: Gastroenterology;  Laterality: N/A;   ENDARTERECTOMY Left 05/13/2019   Procedure: ENDARTERECTOMY CAROTID;  Surgeon: Jama Cordella MATSU, MD;   Location: [facility] ORS;  Service: Vascular;  Laterality: Left;   ESOPHAGOGASTRODUODENOSCOPY (EGD) WITH PROPOFOL  N/A 04/05/2020   Procedure: ESOPHAGOGASTRODUODENOSCOPY (EGD) WITH PROPOFOL ;  Surgeon: Toledo, Ladell POUR, MD;  Location: [facility] ENDOSCOPY;  Service: Gastroenterology;  Laterality: N/A;   ESOPHAGOGASTRODUODENOSCOPY (EGD) WITH PROPOFOL  N/A 09/22/2020   Procedure: ESOPHAGOGASTRODUODENOSCOPY (EGD) WITH PROPOFOL ;  Surgeon: Onita Elspeth Sharper, DO;  Location: [facility] ENDOSCOPY;  Service: Gastroenterology;  Laterality: N/A;   TUBAL LIGATION      Social History:  reports that she has been smoking cigarettes. She started smoking about 41 years ago. She has never used smokeless tobacco. She reports current alcohol use of about 3.0 standard drinks of alcohol per week. She reports current drug use. Frequency: 3.00 times per week. Drug: Marijuana.  Family History:  Family History  Problem Relation Age of Onset   Diabetes Mother    Hypertension Mother    Cancer Father        leukemia   Alcohol abuse Sister    Breast cancer Neg Hx      Prior to Admission medications   Medication Sig Start Date End Date Taking? Authorizing Provider  acetaminophen  (TYLENOL ) 500 MG tablet Take 1,000 mg by mouth every 6 (six) hours as needed.    [provider]  albuterol  (VENTOLIN  HFA) 108 (90 Base) MCG/ACT inhaler Inhale 2 puffs into the lungs every 6 (six) hours as needed for wheezing or shortness of breath. 04/26/20   Iloabachie, Chioma E, NP  amLODipine  (NORVASC ) 10 MG tablet TAKE ONE TABLET BY MOUTH ONCE EVERY DAY. 02/27/21 02/27/22  Iloabachie, Chioma E, NP  atorvastatin  (LIPITOR) 40 MG tablet TAKE ONE TABLET BY MOUTH ONCE EVERY DAY. 02/27/21 02/11/22  Iloabachie, Chioma E, NP  cholecalciferol  (VITAMIN D3) 25 MCG (1000 UNIT) tablet Take 1,000 Units by mouth daily.    [provider]  ferrous sulfate  325 (65 FE) MG tablet Take 325 mg by mouth daily.    [provider]  fluticasone  (FLONASE ) 50  MCG/ACT nasal spray PLACE 2 SPRAYS INTO BOTH NOSTRILS EVERY DAY 02/29/20 06/26/21  Iloabachie, Chioma E, NP  pantoprazole  (PROTONIX ) 40 MG tablet Take 1 tablet (40 mg total) by mouth once daily. 11/17/21 02/15/22  Gordan Huxley, MD    Physical Exam: Vitals:   12/01/23 2200 12/01/23 2250 12/01/23 2314 12/01/23 2315  BP: (!) 142/91 (!) 140/79 (!) 146/93 (!) 146/93  Pulse:  84 86 86  Resp:  18 18 18   Temp: 98.4 F (36.9 C) 98.5 F (36.9 C) 98.4 F (36.9 C) 98.4 F (36.9 C)  TempSrc:  Oral  Oral  SpO2:  96%  97%  Weight:      Height:       General: Not in acute distress. Pale looking. HEENT:       Eyes: PERRL, EOMI, no jaundice       ENT: No discharge from the ears and nose, no pharynx injection, no tonsillar enlargement.        Neck: No JVD, no bruit,  no mass felt. Heme: No neck lymph node enlargement. Cardiac: S1/S2, RRR, No murmurs, No gallops or rubs. Respiratory: No rales, wheezing, rhonchi or rubs. GI: Soft, nondistended, nontender, no rebound pain, no organomegaly, BS present. GU: No hematuria Ext: No pitting leg edema bilaterally. 1+DP/PT pulse bilaterally. Musculoskeletal: No joint deformities, No joint redness or warmth, no limitation of ROM in spin. Skin: No rashes.  Neuro: Alert, oriented X3, cranial nerves II-XII grossly intact, moves all extremities normally.  Psych: Patient is not psychotic, no suicidal or hemocidal ideation.  Labs on Admission: I have personally reviewed following labs and imaging studies  CBC: Recent Labs  Lab 12/01/23 1511 12/01/23 2027  WBC 8.2 8.0  HGB 7.5* 6.6*  HCT 27.4* 24.8*  MCV 74.5* 74.5*  PLT 264 265   Basic Metabolic Panel: Recent Labs  Lab 12/01/23 1511  NA 140  K 3.6  CL 103  CO2 25  GLUCOSE 146*  BUN 15  CREATININE 0.83  CALCIUM  9.5   GFR: Estimated Creatinine Clearance: 87.7 mL/min (by C-G formula based on SCr of 0.83 mg/dL). Liver Function Tests: Recent Labs  Lab 12/01/23 1511  AST 17  ALT 7  ALKPHOS 120   BILITOT 0.6  PROT 7.4  ALBUMIN 4.0   No results for input(s): LIPASE, AMYLASE in the last 168 hours. No results for input(s): AMMONIA in the last 168 hours. Coagulation Profile: Recent Labs  Lab 12/01/23 2027  INR 0.9   Cardiac Enzymes: No results for input(s): CKTOTAL, CKMB, CKMBINDEX, TROPONINI in the last 168 hours. BNP (last 3 results) Recent Labs    12/01/23 2027  PROBNP <50.0   HbA1C: No results for input(s): HGBA1C in the last 72 hours. CBG: No results for input(s): GLUCAP in the last 168 hours. Lipid Profile: No results for input(s): CHOL, HDL, LDLCALC, TRIG, CHOLHDL, LDLDIRECT in the last 72 hours. Thyroid Function Tests: No results for input(s): TSH, T4TOTAL, FREET4, T3FREE, THYROIDAB in the last 72 hours. Anemia Panel: Recent Labs    12/01/23 2027  FOLATE 6.2  FERRITIN 8*  TIBC 588*  IRON  11*  RETICCTPCT 2.6   Urine analysis:    Component Value Date/Time   COLORURINE YELLOW (A) 11/16/2020 1206   APPEARANCEUR Clear 12/18/2020 1423   LABSPEC 1.013 11/16/2020 1206   PHURINE 7.0 11/16/2020 1206   GLUCOSEU Negative 12/18/2020 1423   HGBUR NEGATIVE 11/16/2020 1206   BILIRUBINUR Negative 12/18/2020 1423   KETONESUR NEGATIVE 11/16/2020 1206   PROTEINUR Negative 12/18/2020 1423   PROTEINUR NEGATIVE 11/16/2020 1206   NITRITE Negative 12/18/2020 1423   NITRITE NEGATIVE 11/16/2020 1206   LEUKOCYTESUR 1+ (A) 12/18/2020 1423   LEUKOCYTESUR NEGATIVE 11/16/2020 1206   Sepsis Labs: @LABRCNTIP (procalcitonin:4,lacticidven:4) )No results found for this or any previous visit (from the past 240 hours).   Radiological Exams on Admission:   Assessment/Plan Principal Problem:   GI bleeding Active Problems:   Acute blood loss anemia   Iron  deficiency anemia   HTN (hypertension)   Cerebrovascular accident (CVA) (HCC)   Hyperlipidemia   Acid reflux   Tobacco abuse   Obesity, Class III, BMI 40-49.9 (morbid obesity)  (HCC)   Chronic diastolic CHF (congestive heart failure) (HCC)   Assessment and Plan:  GI bleeding, acute blood loss anemia and hx of iron  deficiency anemia: Hgb dropped from 11.4 --> 7.5, then  --> 6.6. pt likely has upper GI bleeding.  Patient is taking aspirin .  Mobic  on her med list, but patient states that she is not taking this medication.  -  will place in tele bed for obs -Hold ASA and mobic  -Transfuse 1 units of blood now (2 units of blood was ordered, but the patient is only qualified for 1 unit) - NPO after MN, pending GI consult.  - IVF: 500 ml of NS bolus - Start IV pantoprazole  40 mg bid - Anemia panel -Continue iron  supplement - Zofran  IV for nausea - Avoid NSAIDs and SQ heparin  - Maintain IV access (2 large bore IVs if possible). - Monitor closely and follow q6h cbc, transfuse as necessary, if Hgb<7.0 - LaB: INR, PTT and type screen  HTN (hypertension) -Hold amlodipine  since patient is at risk of developing hypotension due to GI bleeding - IV hydralazine  as needed  History of Cerebrovascular accident (CVA) (HCC) -Hold aspirin  - Continue Lipitor  Hyperlipidemia -Lipitor  Acid reflux -On IV Protonix  as above  Tobacco abuse -Could not patch  Obesity, Class III, BMI 40-49.9 (morbid obesity) (HCC): Patient has Obesity Class III, with body weight  111.6 Kg and BMI 43.58 kg/m2.  - Encourage losing weight - Exercise and healthy diet  Chronic diastolic CHF: 2D echo on 04/06/2020 showed EF 60 to 65% with grade 1 diastolic dysfunction.  Patient does not have leg edema.  CHF seem to be compensated. - Check proBNP level: < 50    DVT ppx: SCD  Code Status: Full code   Family Communication:     not done, no family member is at bed side.    Disposition Plan:  Anticipate discharge back to previous environment  Consults called:  Dr. Maryruth for GI is consulted by EDP  Admission status and Level of care: Telemetry:    for obs     Dispo: The patient is from:  Home              Anticipated d/c is to: Home              Anticipated d/c date is: 1 day              Patient currently is not medically stable to d/c.    Severity of Illness:  The appropriate patient status for this patient is OBSERVATION. Observation status is judged to be reasonable and necessary in order to provide the required intensity of service to ensure the patient's safety. The patient's presenting symptoms, physical exam findings, and initial radiographic and laboratory data in the context of their medical condition is felt to place them at decreased risk for further clinical deterioration. Furthermore, it is anticipated that the patient will be medically stable for discharge from the hospital within 2 midnights of admission.        Date of Service 12/02/2023    Caleb Exon [facility]   If 7PM-7AM, please contact night-coverage [email or web address] 12/02/2023, 12:18 AM

## 2023-12-01 NOTE — ED Triage Notes (Addendum)
 Pt seen at PCP yesterday and had blood drawn. Pt said they called her today and told her to come to ER for blood transfusion. Pt says she had a black stool 2 weeks ago. Pt reports feeling fatigue, dizziness, and exertional dyspnea. Pt has had to have a blood transfusion in the past. Pt does not take blood thinners

## 2023-12-01 NOTE — ED Notes (Addendum)
 Hemmocult negative with Dr. Dorothyann.

## 2023-12-02 DIAGNOSIS — F17210 Nicotine dependence, cigarettes, uncomplicated: Secondary | ICD-10-CM | POA: Diagnosis present

## 2023-12-02 DIAGNOSIS — Z806 Family history of leukemia: Secondary | ICD-10-CM | POA: Diagnosis not present

## 2023-12-02 DIAGNOSIS — K449 Diaphragmatic hernia without obstruction or gangrene: Secondary | ICD-10-CM | POA: Diagnosis present

## 2023-12-02 DIAGNOSIS — E785 Hyperlipidemia, unspecified: Secondary | ICD-10-CM | POA: Diagnosis present

## 2023-12-02 DIAGNOSIS — I5032 Chronic diastolic (congestive) heart failure: Secondary | ICD-10-CM | POA: Diagnosis present

## 2023-12-02 DIAGNOSIS — Z8673 Personal history of transient ischemic attack (TIA), and cerebral infarction without residual deficits: Secondary | ICD-10-CM | POA: Diagnosis not present

## 2023-12-02 DIAGNOSIS — E66813 Obesity, class 3: Secondary | ICD-10-CM | POA: Diagnosis present

## 2023-12-02 DIAGNOSIS — K259 Gastric ulcer, unspecified as acute or chronic, without hemorrhage or perforation: Secondary | ICD-10-CM | POA: Diagnosis not present

## 2023-12-02 DIAGNOSIS — D649 Anemia, unspecified: Secondary | ICD-10-CM

## 2023-12-02 DIAGNOSIS — Z833 Family history of diabetes mellitus: Secondary | ICD-10-CM | POA: Diagnosis not present

## 2023-12-02 DIAGNOSIS — Z7982 Long term (current) use of aspirin: Secondary | ICD-10-CM | POA: Diagnosis not present

## 2023-12-02 DIAGNOSIS — K640 First degree hemorrhoids: Secondary | ICD-10-CM | POA: Diagnosis present

## 2023-12-02 DIAGNOSIS — Z79899 Other long term (current) drug therapy: Secondary | ICD-10-CM | POA: Diagnosis not present

## 2023-12-02 DIAGNOSIS — K922 Gastrointestinal hemorrhage, unspecified: Secondary | ICD-10-CM | POA: Diagnosis present

## 2023-12-02 DIAGNOSIS — Z6841 Body Mass Index (BMI) 40.0 and over, adult: Secondary | ICD-10-CM | POA: Diagnosis not present

## 2023-12-02 DIAGNOSIS — D62 Acute posthemorrhagic anemia: Secondary | ICD-10-CM | POA: Diagnosis present

## 2023-12-02 DIAGNOSIS — K3189 Other diseases of stomach and duodenum: Secondary | ICD-10-CM | POA: Diagnosis present

## 2023-12-02 DIAGNOSIS — I110 Hypertensive heart disease with heart failure: Secondary | ICD-10-CM | POA: Diagnosis present

## 2023-12-02 DIAGNOSIS — D509 Iron deficiency anemia, unspecified: Secondary | ICD-10-CM | POA: Diagnosis present

## 2023-12-02 DIAGNOSIS — K5730 Diverticulosis of large intestine without perforation or abscess without bleeding: Secondary | ICD-10-CM | POA: Diagnosis present

## 2023-12-02 DIAGNOSIS — Z8249 Family history of ischemic heart disease and other diseases of the circulatory system: Secondary | ICD-10-CM | POA: Diagnosis not present

## 2023-12-02 DIAGNOSIS — Z791 Long term (current) use of non-steroidal anti-inflammatories (NSAID): Secondary | ICD-10-CM | POA: Diagnosis not present

## 2023-12-02 DIAGNOSIS — K219 Gastro-esophageal reflux disease without esophagitis: Secondary | ICD-10-CM | POA: Diagnosis present

## 2023-12-02 DIAGNOSIS — K254 Chronic or unspecified gastric ulcer with hemorrhage: Secondary | ICD-10-CM | POA: Diagnosis present

## 2023-12-02 LAB — CBC
HCT: 27 % — ABNORMAL LOW (ref 36.0–46.0)
HCT: 27.9 % — ABNORMAL LOW (ref 36.0–46.0)
HCT: 28.6 % — ABNORMAL LOW (ref 36.0–46.0)
HCT: 29.8 % — ABNORMAL LOW (ref 36.0–46.0)
Hemoglobin: 7.6 g/dL — ABNORMAL LOW (ref 12.0–15.0)
Hemoglobin: 7.9 g/dL — ABNORMAL LOW (ref 12.0–15.0)
Hemoglobin: 8.2 g/dL — ABNORMAL LOW (ref 12.0–15.0)
Hemoglobin: 8.3 g/dL — ABNORMAL LOW (ref 12.0–15.0)
MCH: 21.1 pg — ABNORMAL LOW (ref 26.0–34.0)
MCH: 21 pg — ABNORMAL LOW (ref 26.0–34.0)
MCH: 21.2 pg — ABNORMAL LOW (ref 26.0–34.0)
MCH: 20.6 pg — ABNORMAL LOW (ref 26.0–34.0)
MCHC: 28.1 g/dL — ABNORMAL LOW (ref 30.0–36.0)
MCHC: 28.3 g/dL — ABNORMAL LOW (ref 30.0–36.0)
MCHC: 28.7 g/dL — ABNORMAL LOW (ref 30.0–36.0)
MCHC: 27.9 g/dL — ABNORMAL LOW (ref 30.0–36.0)
MCV: 75 fL — ABNORMAL LOW (ref 80.0–100.0)
MCV: 74.2 fL — ABNORMAL LOW (ref 80.0–100.0)
MCV: 73.9 fL — ABNORMAL LOW (ref 80.0–100.0)
MCV: 74.1 fL — ABNORMAL LOW (ref 80.0–100.0)
Platelets: 216 K/uL (ref 150–400)
Platelets: 230 K/uL (ref 150–400)
Platelets: 246 K/uL (ref 150–400)
Platelets: 248 K/uL (ref 150–400)
RBC: 3.6 MIL/uL — ABNORMAL LOW (ref 3.87–5.11)
RBC: 3.76 MIL/uL — ABNORMAL LOW (ref 3.87–5.11)
RBC: 3.87 MIL/uL (ref 3.87–5.11)
RBC: 4.02 MIL/uL (ref 3.87–5.11)
RDW: 19.8 % — ABNORMAL HIGH (ref 11.5–15.5)
RDW: 19.9 % — ABNORMAL HIGH (ref 11.5–15.5)
RDW: 19.9 % — ABNORMAL HIGH (ref 11.5–15.5)
RDW: 19.7 % — ABNORMAL HIGH (ref 11.5–15.5)
WBC: 6.6 K/uL (ref 4.0–10.5)
WBC: 6 K/uL (ref 4.0–10.5)
WBC: 6.8 K/uL (ref 4.0–10.5)
WBC: 7.1 K/uL (ref 4.0–10.5)
nRBC: 0 % (ref 0.0–0.2)
nRBC: 0 % (ref 0.0–0.2)
nRBC: 0 % (ref 0.0–0.2)
nRBC: 0.3 % — ABNORMAL HIGH (ref 0.0–0.2)

## 2023-12-02 LAB — BPAM RBC
Blood Product Expiration Date: 202512192359
ISSUE DATE / TIME: 202511182255
Unit Type and Rh: 5100

## 2023-12-02 LAB — BASIC METABOLIC PANEL WITH GFR
Anion gap: 8 (ref 5–15)
BUN: 9 mg/dL (ref 6–20)
CO2: 25 mmol/L (ref 22–32)
Calcium: 9 mg/dL (ref 8.9–10.3)
Chloride: 106 mmol/L (ref 98–111)
Creatinine, Ser: 0.51 mg/dL (ref 0.44–1.00)
GFR, Estimated: >60 mL/min (ref >60)
Glucose, Bld: 100 mg/dL — ABNORMAL HIGH (ref 70–99)
Potassium: 3.7 mmol/L (ref 3.5–5.1)
Sodium: 139 mmol/L (ref 135–145)

## 2023-12-02 LAB — TYPE AND SCREEN
ABO/RH(D): O POS
Antibody Screen: NEGATIVE
Unit division: 0

## 2023-12-02 LAB — VITAMIN B12: Vitamin B-12: 263 pg/mL (ref 180–914)

## 2023-12-02 MED ORDER — SODIUM CHLORIDE 0.9 % IV SOLN: INTRAVENOUS | Status: AC

## 2023-12-02 MED ORDER — PEG 3350-KCL-NA BICARB-NACL 420 G PO SOLR
4000.0000 mL | Freq: Once | ORAL | Status: AC
  Administered 2023-12-02: 4000 mL via ORAL
  Filled 2023-12-02: qty 4000

## 2023-12-02 MED ORDER — ORAL CARE MOUTH RINSE: 15.0000 mL | OROMUCOSAL | Status: DC | PRN

## 2023-12-02 NOTE — Progress Notes (Signed)
 Progress Note   Patient: Kimberly Mcdonald FMW:[id number] DOB: Jul 24, 1965 DOA: 12/01/2023     0 DOS: the patient was seen and examined on 12/02/2023   Brief hospital course:  Kimberly Mcdonald is a 58 y.o. female with medical history significant of upper GIB, HTN, HLD, dCHF, stroke, iron  deficiency anemia, GERD, tobacco abuse, obesity, carotid artery stenosis, who presents with dark stool, fatigue, SOB, low hemoglobin.   Patient states that she has fatigue, dizzy and exertional SOB for more than 2 weeks.  No fall.  She reports 1 episode of dark stool last week, currently no rectal bleeding or dark stool.  No nausea, vomiting, diarrhea or abdominal pain.  No chest pain or cough.  No fever or chills.  No symptoms of UTI.  Patient states that she is not taking iron  supplement.  She is taking 81 mg aspirin  daily.  Mobic  is on her medication list, but she states that she is not taking these medications. Her PCP did lab yesterday which showed low Hgb level of 6.4 per patient. Pt is recommended to come to the hospital for further evaluation and treatment.   Of note, pt had hx of upper GIB 2021 and 2022. Pt had EGD and colonoscopy 2022, but no clear source of GI bleeding identified.  EGD showed erosive gastropathy with no bleeding or stigmata of bleeding.   Assessment and Plan:  Acute blood loss anemia Symptomatic iron  deficiency anemia Patient presents to the ER for evaluation of fatigue and exertional shortness of breath Noted to have a significant drop in her H&H from 11.4 >> 7.5 >> 6.6 Hold aspirin  Patient was transfused 1 unit of packed RBC with improvement in her H&H Continue PPI GI consult      HTN (hypertension) Continue amlodipine     History of Cerebrovascular accident (CVA) (HCC) Aspirin  is on hold.  Continue atorvastatin       Tobacco abuse  continue nicotine  patch   Obesity, Class III, BMI 40-49.9 (morbid obesity) (HCC): Patient has Obesity Class III, with body  weight  111.6 Kg and BMI 43.58 kg/m2.  Lifestyle modification and exercise has been discussed with patient in detail    Chronic diastolic CHF: Not acutely exacerbated  2D echo on 04/06/2020 showed EF 60 to 65% with grade 1 diastolic dysfunction.  Patient does not have leg edema.  CHF seem to be compensated. Of pulm       Subjective: No further episodes of melena stools  Physical Exam: Vitals:   12/02/23 0245 12/02/23 0336 12/02/23 0756 12/02/23 1448  BP: (!) 142/86 (!) 144/69 (!) 140/98 (!) 148/87  Pulse: 83 79 83 85  Resp: 18 18 (!) 21 20  Temp: 98.2 F (36.8 C) 97.7 F (36.5 C) 98.3 F (36.8 C) 98.1 F (36.7 C)  TempSrc: Oral Oral    SpO2: 94% 94% 95% 97%  Weight:  113.2 kg    Height:       General: Not in acute distress. Pale conjunctiva HEENT:       Eyes: PERRL, EOMI, no jaundice       ENT: No discharge from the ears and nose, no pharynx injection, no tonsillar enlargement.        Neck: No JVD, no bruit, no mass felt. Heme: No neck lymph node enlargement. Cardiac: S1/S2, RRR, No murmurs, No gallops or rubs. Respiratory: No rales, wheezing, rhonchi or rubs. GI: Soft, nondistended, nontender, no rebound pain, no organomegaly, BS present. GU: No hematuria Ext: No pitting leg edema bilaterally. 1+DP/PT  pulse bilaterally. Musculoskeletal: No joint deformities, No joint redness or warmth, no limitation of ROM in spin. Skin: No rashes.  Neuro: Alert, oriented X3, cranial nerves II-XII grossly intact, moves all extremities normally.  Psych: Patient is not psychotic, no suicidal or hemocidal ideation.    Data Reviewed: Hemoglobin 8.2 from 6.6 on admission Labs reviewed  Family Communication: Plan of care discussed with patient at the bedside.  She verbalizes understanding and agrees with the plan of of care  Disposition: Status is: Inpatient Remains inpatient appropriate because: For upper endoscopy  Planned Discharge Destination: Home    Time spent: 50  minutes  Author: Aimee Somerset, MD 12/02/2023 3:54 PM  For on call review [email or web address].

## 2023-12-02 NOTE — Plan of Care (Signed)

## 2023-12-02 NOTE — Consult Note (Signed)
 Kimberly Copping, MD [facility]  [address], [address] [place], [place] [address] Phone: [phone] Fax : [phone]  Consultation  Referring Provider:     Dr. Hilma Primary Care Physician:  System, Provider Not In Primary Gastroenterologist:  Dr. Onita         Reason for Consultation:     Anemia  Date of Admission:  12/01/2023 Date of Consultation:  12/02/2023   HPI:   Kimberly Mcdonald is a 58 y.o. female with a history of anemia, arthritis hypertension, hyperlipidemia, CVA who had presented to the emergency room with low blood count.  The patient came in to the ED with a report of having been more weak over the last several weeks and went to see her PCP.  After the labs were resulted the patient was told to go to the emergency department.  The patient's most recent blood work showed:  Component     Latest Ref Rng 11/17/2021 12/01/2023 12/02/2023  Hemoglobin     12.0 - 15.0 g/dL 88.5 (L)  6.6 (L)  7.9 (L)   Hemoglobin       7.5 (L)  7.6 (L)   HCT     36.0 - 46.0 % 36.2  24.8 (L)  27.9 (L)   HCT       27.4 (L)  27.0 (L)   MCV     80.0 - 100.0 fL 86.8  74.5 (L)  74.2 (L)   MCV       74.5 (L)  75.0 (L)    The patient was noted to have a low ferritin at 8 with iron  studies showing a iron  of 11 with a saturation of 2%. The patient had reported that she had noticed 1 episode of darker stools prior to coming to the hospital. Upon admission to the ED she had reported that she was feeling dizzy with fatigued and short of breath on exertion for approximately 2 weeks.  No report of any nausea vomiting diarrhea or abdominal pain.  She was taking an 81 mg aspirin  daily.  The patient had an upper endoscopy and colonoscopy by Dr. Aundria in March of 2022 with multiple erosions found on the upper endoscopy with a colonoscopy showing nonbleeding hemorrhoids with diverticulosis throughout the entire colon the patient had another upper endoscopy in September 2022 by Dr. Onita that showed  erosive gastropathy without any stigmata of any active bleeding with a large hiatal hernia. The patient denies any abdominal pain today or any black or bloody stools since being admitted.  She also has not had any further drop in her hemoglobin.  Past Medical History:  Diagnosis Date   Acid reflux 02/27/2021   Allergy    seasonal   Anemia due to blood loss 05/08/2019   Arthritis    knees and back   Cerebrovascular accident (CVA) (HCC)    HTN (hypertension)    Hyperlipidemia    Hypertension    Stroke [facility])    Substance abuse (HCC)    Marijuana smoker x35 yrs    Past Surgical History:  Procedure Laterality Date   ABDOMINAL SURGERY     BREAST BIOPSY Left 11/09/2020   stereo bx 11/09/2020-coil clip path pending   COLONOSCOPY WITH PROPOFOL  N/A 04/05/2020   Procedure: COLONOSCOPY WITH PROPOFOL ;  Surgeon: Toledo, Ladell POUR, MD;  Location: [facility] ENDOSCOPY;  Service: Gastroenterology;  Laterality: N/A;   ENDARTERECTOMY Left 05/13/2019   Procedure: ENDARTERECTOMY CAROTID;  Surgeon: Jama Cordella MATSU, MD;  Location:  [facility] ORS;  Service: Vascular;  Laterality: Left;   ESOPHAGOGASTRODUODENOSCOPY (EGD) WITH PROPOFOL  N/A 04/05/2020   Procedure: ESOPHAGOGASTRODUODENOSCOPY (EGD) WITH PROPOFOL ;  Surgeon: Toledo, Ladell POUR, MD;  Location: [facility] ENDOSCOPY;  Service: Gastroenterology;  Laterality: N/A;   ESOPHAGOGASTRODUODENOSCOPY (EGD) WITH PROPOFOL  N/A 09/22/2020   Procedure: ESOPHAGOGASTRODUODENOSCOPY (EGD) WITH PROPOFOL ;  Surgeon: Onita Elspeth Sharper, DO;  Location: [facility] ENDOSCOPY;  Service: Gastroenterology;  Laterality: N/A;   TUBAL LIGATION      Prior to Admission medications   Medication Sig Start Date End Date Taking? Authorizing Provider  acetaminophen  (TYLENOL ) 500 MG tablet Take 1,000 mg by mouth every 6 (six) hours as needed.   Yes [provider]  albuterol  (VENTOLIN  HFA) 108 (90 Base) MCG/ACT inhaler Inhale 2 puffs into the lungs every 6 (six) hours as needed for wheezing or  shortness of breath. 04/26/20  Yes Iloabachie, Chioma E, NP  amLODipine  (NORVASC ) 10 MG tablet TAKE ONE TABLET BY MOUTH ONCE EVERY DAY. 02/27/21 12/01/23 Yes Iloabachie, Chioma E, NP  aspirin  EC 81 MG tablet Take 81 mg by mouth daily. 09/09/23  Yes [provider]  atorvastatin  (LIPITOR) 40 MG tablet TAKE ONE TABLET BY MOUTH ONCE EVERY DAY. 02/27/21 12/01/23 Yes Iloabachie, Chioma E, NP  cholecalciferol  (VITAMIN D3) 25 MCG (1000 UNIT) tablet Take 1,000 Units by mouth daily.   Yes [provider]  ferrous sulfate  325 (65 FE) MG tablet Take 325 mg by mouth daily.   Yes [provider]  meloxicam  (MOBIC ) 15 MG tablet Take 15 mg by mouth daily. 11/30/23  Yes [provider]  pantoprazole  (PROTONIX ) 40 MG tablet Take 1 tablet (40 mg total) by mouth once daily. 11/17/21 12/01/23 Yes Gordan Huxley, MD  PARoxetine Mesylate 7.5 MG CAPS Take 7.5 mg by mouth at bedtime. 09/09/23  Yes [provider]  amoxicillin -clavulanate (AUGMENTIN ) 875-125 MG tablet Take 1 tablet by mouth 2 (two) times daily. Patient not taking: Reported on 12/01/2023 11/20/23   [provider]  fluticasone  (FLONASE ) 50 MCG/ACT nasal spray PLACE 2 SPRAYS INTO BOTH NOSTRILS EVERY DAY 02/29/20 06/26/21  Iloabachie, Chioma E, NP    Family History  Problem Relation Age of Onset   Diabetes Mother    Hypertension Mother    Cancer Father        leukemia   Alcohol abuse Sister    Breast cancer Neg Hx      Social History   Tobacco Use   Smoking status: Every Day    Current packs/day: 0.00    Types: Cigarettes    Start date: 04/04/1982    Last attempt to quit: 04/03/2020    Years since quitting: 3.6   Smokeless tobacco: Never   Tobacco comments:    Smokes 3-4 cigarettes per week  Vaping Use   Vaping status: Never Used  Substance Use Topics   Alcohol use: Yes    Alcohol/week: 3.0 standard drinks of alcohol    Types: 3 Shots of liquor per week   Drug use: Yes    Frequency: 3.0 times  per week    Types: Marijuana    Comment: last use last week    Allergies as of 12/01/2023   (No Known Allergies)    Review of Systems:    All systems reviewed and negative except where noted in HPI.   Physical Exam:  Vital signs in last 24 hours: Temp:  [97.7 F (36.5 C)-98.5 F (36.9 C)] 98.3 F (36.8 C) (11/19 0756) Pulse Rate:  [77-102] 83 (11/19 0756)  Resp:  [16-21] 21 (11/19 0756) BP: (109-148)/(61-102) 140/98 (11/19 0756) SpO2:  [94 %-100 %] 95 % (11/19 0756) Weight:  [110.1 kg-113.2 kg] 113.2 kg (11/19 0336) Last BM Date : 12/01/23 General:   Pleasant, cooperative in NAD Head:  Normocephalic and atraumatic. Eyes:   No icterus.   Conjunctiva pink. PERRLA. Ears:  Normal auditory acuity. Rectal:  Not performed. Msk:  Symmetrical without gross deformities.    Neurologic:  Alert and oriented x3;  grossly normal neurologically. Skin:  Intact without significant lesions or rashes. Psych:  Alert and cooperative. Normal affect.  LAB RESULTS: Recent Labs    12/01/23 2027 12/02/23 0412 12/02/23 0728  WBC 8.0 6.6 6.0  HGB 6.6* 7.6* 7.9*  HCT 24.8* 27.0* 27.9*  PLT 265 216 230   BMET Recent Labs    12/01/23 1511  NA 140  K 3.6  CL 103  CO2 25  GLUCOSE 146*  BUN 15  CREATININE 0.83  CALCIUM  9.5   LFT Recent Labs    12/01/23 1511  PROT 7.4  ALBUMIN 4.0  AST 17  ALT 7  ALKPHOS 120  BILITOT 0.6   PT/INR Recent Labs    12/01/23 2027  LABPROT 13.1  INR 0.9    STUDIES: No results found.    Impression / Plan:   Assessment: Principal Problem:   GI bleeding Active Problems:   Tobacco abuse   Cerebrovascular accident (CVA) (HCC)   Obesity, Class III, BMI 40-49.9 (morbid obesity) (HCC)   Hyperlipidemia   Acid reflux   HTN (hypertension)   Acute blood loss anemia   Iron  deficiency anemia   Chronic diastolic CHF (congestive heart failure) (HCC)   Kimberly Mcdonald is a 58 y.o. y/o female with with a significant drop in her hemoglobin  with her blood count showing a hemoglobin of 11.4 back on 5 November which came down to 6.6 on the 18th when she was admitted and the patient is now at 7.9 after transfusions.  The patient had only 1 episode of dark stools last week.  Plan:  The patient will be set up for an EGD and colonoscopy due to her significant anemia.  She has not had a colonoscopy in 3 years years and her upper endoscopy showed multiple erosions in the stomach.  The patient has been explained the EGD and colonoscopy risks benefits and alternatives and agrees to proceeding with the procedure.  The patient will be given a prep today and have the procedures done tomorrow.  She has been explained the plan and agrees with it.  Thank you for involving me in the care of this patient.      LOS: 0 days   Kimberly Copping, MD, MD. NOLIA 12/02/2023, 8:30 AM,  Pager [phone] 7am-5pm  Check AMION for 5pm -7am coverage and on weekends   Note: This dictation was prepared with Dragon dictation along with smaller phrase technology. Any transcriptional errors that result from this process are unintentional.

## 2023-12-02 NOTE — Plan of Care (Signed)

## 2023-12-03 ENCOUNTER — Inpatient Hospital Stay: Admitting: Anesthesiology

## 2023-12-03 ENCOUNTER — Encounter
Admission: EM | Disposition: A | Discharge status: Home / Self Care | Payer: Self-pay | Source: Home / Self Care | Attending: Internal Medicine

## 2023-12-03 ENCOUNTER — Other Ambulatory Visit: Payer: Self-pay

## 2023-12-03 ENCOUNTER — Encounter: Payer: Self-pay | Admitting: Internal Medicine

## 2023-12-03 DIAGNOSIS — K640 First degree hemorrhoids: Secondary | ICD-10-CM

## 2023-12-03 DIAGNOSIS — D62 Acute posthemorrhagic anemia: Secondary | ICD-10-CM | POA: Diagnosis not present

## 2023-12-03 DIAGNOSIS — K254 Chronic or unspecified gastric ulcer with hemorrhage: Secondary | ICD-10-CM | POA: Diagnosis present

## 2023-12-03 DIAGNOSIS — K449 Diaphragmatic hernia without obstruction or gangrene: Secondary | ICD-10-CM

## 2023-12-03 DIAGNOSIS — K259 Gastric ulcer, unspecified as acute or chronic, without hemorrhage or perforation: Secondary | ICD-10-CM | POA: Diagnosis not present

## 2023-12-03 DIAGNOSIS — K219 Gastro-esophageal reflux disease without esophagitis: Secondary | ICD-10-CM

## 2023-12-03 DIAGNOSIS — D509 Iron deficiency anemia, unspecified: Secondary | ICD-10-CM | POA: Diagnosis not present

## 2023-12-03 HISTORY — PX: ESOPHAGOGASTRODUODENOSCOPY: SHX5428

## 2023-12-03 HISTORY — PX: COLONOSCOPY: SHX5424

## 2023-12-03 LAB — HIV ANTIBODY (ROUTINE TESTING W REFLEX): HIV Screen 4th Generation wRfx: REACTIVE — AB

## 2023-12-03 SURGERY — EGD (ESOPHAGOGASTRODUODENOSCOPY)
Anesthesia: General

## 2023-12-03 MED ORDER — LIDOCAINE HCL (CARDIAC) PF 100 MG/5ML IV SOSY
PREFILLED_SYRINGE | INTRAVENOUS | Status: DC | PRN
  Administered 2023-12-03: 80 mg via INTRAVENOUS

## 2023-12-03 MED ORDER — DEXMEDETOMIDINE HCL IN NACL 80 MCG/20ML IV SOLN
INTRAVENOUS | Status: DC | PRN
  Administered 2023-12-03: 8 ug via INTRAVENOUS
  Administered 2023-12-03: 12 ug via INTRAVENOUS

## 2023-12-03 MED ORDER — GLYCOPYRROLATE 0.2 MG/ML IJ SOLN
INTRAMUSCULAR | Status: DC | PRN
Start: 2023-12-03 — End: 2023-12-03
  Administered 2023-12-03: .2 mg via INTRAVENOUS

## 2023-12-03 MED ORDER — PANTOPRAZOLE SODIUM 40 MG PO TBEC
40.0000 mg | DELAYED_RELEASE_TABLET | Freq: Two times a day (BID) | ORAL | 1 refills | Status: AC
  Filled 2023-12-03: qty 30, 15d supply, fill #0

## 2023-12-03 MED ORDER — PROPOFOL 500 MG/50ML IV EMUL
INTRAVENOUS | Status: DC | PRN
  Administered 2023-12-03: 75 ug/kg/min via INTRAVENOUS

## 2023-12-03 MED ORDER — PROPOFOL 10 MG/ML IV BOLUS
INTRAVENOUS | Status: DC | PRN
  Administered 2023-12-03 (×2): 50 mg via INTRAVENOUS

## 2023-12-03 NOTE — Discharge Summary (Signed)
 Physician Discharge Summary   Patient: Kimberly Mcdonald MRN: [id number] DOB: [birth year]  Admit date:     12/01/2023  Discharge date: 12/03/23  Discharge Physician: Gloristine Turrubiates   PCP: System, Provider Not In   Recommendations at discharge:   Take PPI as recommended Follow-up with general surgery to discuss hiatal hernia repair Avoid NSAIDs  Discharge Diagnoses: Principal Problem:   ABLA (acute blood loss anemia) Active Problems:   Iron  deficiency anemia   Gastric ulcer with hemorrhage   HTN (hypertension)   Cerebrovascular accident (CVA) (HCC)   Hyperlipidemia   Acid reflux   Tobacco abuse   Obesity, Class III, BMI 40-49.9 (morbid obesity) (HCC)   Chronic diastolic CHF (congestive heart failure) (HCC)  Resolved Problems:   * No resolved hospital problems. *  Hospital Course: Kimberly Mcdonald is a 58 y.o. female with medical history significant of upper GIB, HTN, HLD, dCHF, stroke, iron  deficiency anemia, GERD, tobacco abuse, obesity, carotid artery stenosis, who presents with dark stool, fatigue, SOB, low hemoglobin.   Patient states that she has fatigue, dizzy and exertional SOB for more than 2 weeks.  No fall.  She reports 1 episode of dark stool last week, currently no rectal bleeding or dark stool.  No nausea, vomiting, diarrhea or abdominal pain.  No chest pain or cough.  No fever or chills.  No symptoms of UTI.  Patient states that she is not taking iron  supplement.  She is taking 81 mg aspirin  daily.  Mobic  is on her medication list, but she states that she is not taking these medications. Her PCP did lab yesterday which showed low Hgb level of 6.4 per patient. Pt is recommended to come to the hospital for further evaluation and treatment.   Of note, pt had hx of upper GIB 2021 and 2022. Pt had EGD and colonoscopy 2022, but no clear source of GI bleeding identified.  EGD showed erosive gastropathy with no bleeding or stigmata of bleeding.  Assessment and  Plan:   Acute blood loss anemia Symptomatic iron  deficiency anemia Status post endoscopy which shows large hiatal hernia with multiple Cameron ulcers Patient presented to the ER for evaluation of fatigue and exertional shortness of breath Noted to have a significant drop in her H&H from 11.4 >> 7.5 >> 6.6 She was transfused 1 unit of packed RBC with improvement in her H&H and improvement in her symptoms She had an upper and lower endoscopy.  Upper endoscopy showed a large hiatal hernia with multiple Cameron ulcers Gastroenterology recommended surgical consultation for repair of hiatal hernia Patient has been referred to general surgery as an outpatient Patient advised to abstain from further NSAID use Will discharge home on PPI      HTN (hypertension) Continue amlodipine      History of Cerebrovascular accident (CVA) (HCC) Continue atorvastatin        Tobacco abuse  continue nicotine  patch   Obesity, Class III, BMI 40-49.9 (morbid obesity) (HCC): Patient has Obesity Class III, with body weight  111.6 Kg and BMI 43.58 kg/m2.  Lifestyle modification and exercise has been discussed with patient in detail     Chronic diastolic CHF: Not acutely exacerbated  2D echo on 04/06/2020 showed EF 60 to 65% with grade 1 diastolic dysfunction.  Patient does not have leg edema.  CHF seem to be compensated. Of pulm       HIV antibody screening Reactive Confirmatory test pending Will refer to ID if positive       Consultants: Gastroenterology  Procedures performed: Upper and lower endoscopy Disposition: Home Diet recommendation:  Discharge Diet Orders (From admission, onward)     Start     Ordered   12/03/23 0000  Diet - low sodium heart healthy        12/03/23 1556           Cardiac diet DISCHARGE MEDICATION: Allergies as of 12/03/2023   No Known Allergies      Medication List     STOP taking these medications    amoxicillin -clavulanate 875-125 MG  tablet Commonly known as: AUGMENTIN    aspirin  EC 81 MG tablet   meloxicam  15 MG tablet Commonly known as: MOBIC        TAKE these medications    acetaminophen  500 MG tablet Commonly known as: TYLENOL  Take 1,000 mg by mouth every 6 (six) hours as needed.   albuterol  108 (90 Base) MCG/ACT inhaler Commonly known as: VENTOLIN  HFA Inhale 2 puffs into the lungs every 6 (six) hours as needed for wheezing or shortness of breath.   amLODipine  10 MG tablet Commonly known as: NORVASC  TAKE ONE TABLET BY MOUTH ONCE EVERY DAY.   atorvastatin  40 MG tablet Commonly known as: LIPITOR TAKE ONE TABLET BY MOUTH ONCE EVERY DAY.   cholecalciferol  25 MCG (1000 UNIT) tablet Commonly known as: VITAMIN D3 Take 1,000 Units by mouth daily.   ferrous sulfate  325 (65 FE) MG tablet Take 325 mg by mouth daily.   fluticasone  50 MCG/ACT nasal spray Commonly known as: FLONASE  PLACE 2 SPRAYS INTO BOTH NOSTRILS EVERY DAY   pantoprazole  40 MG tablet Commonly known as: Protonix  Take 1 tablet (40 mg total) by mouth 2 (two) times daily before a meal. What changed: when to take this   PARoxetine Mesylate 7.5 MG Caps Take 7.5 mg by mouth at bedtime.        Follow-up Information     Pabon, [place] F, MD Follow up in 1 week(s).   Specialty: General Surgery Contact information: [address] [place] [address] [phone]                Discharge Exam: Kimberly Mcdonald   12/01/23 1508 12/01/23 1943 12/02/23 0336  Weight: 111.6 kg 110.1 kg 113.2 kg   General: Not in acute distress. Pale conjunctiva, morbidly obese HEENT:       Eyes: PERRL, EOMI, no jaundice       ENT: No discharge from the ears and nose, no pharynx injection, no tonsillar enlargement.        Neck: No JVD, no bruit, no mass felt. Heme: No neck lymph node enlargement. Cardiac: S1/S2, RRR, No murmurs, No gallops or rubs. Respiratory: No rales, wheezing, rhonchi or rubs. GI: Soft, nondistended,  nontender, no rebound pain, no organomegaly, BS present. GU: No hematuria Ext: No pitting leg edema bilaterally. 1+DP/PT pulse bilaterally. Musculoskeletal: No joint deformities, No joint redness or warmth, no limitation of ROM in spin. Skin: No rashes.  Neuro: Alert, oriented X3, cranial nerves II-XII grossly intact, moves all extremities normally.  Psych: Patient is not psychotic, no suicidal or hemocidal ideation.     Condition at discharge: stable  The results of significant diagnostics from this hospitalization (including imaging, microbiology, ancillary and laboratory) are listed below for reference.   Imaging Studies: No results found.  Microbiology: Results for orders placed or performed in visit on 12/18/20  Microscopic Examination     Status: Abnormal   Collection Time: 12/18/20  2:23 PM   Urine  Result Value  Ref Range Status   WBC, UA 11-30 (A) 0 - 5 /hpf Final   RBC, Urine 0-2 0 - 2 /hpf Final   Epithelial Cells (non renal) 0-10 0 - 10 /hpf Final   Casts None seen None seen /lpf Final   Bacteria, UA None seen None seen/Few Final  Urine Culture, Reflex     Status: None   Collection Time: 12/18/20  2:23 PM   Urine  Result Value Ref Range Status   Urine Culture, Routine Final report  Final   Organism ID, Bacteria Comment  Final    Comment: Culture shows less than 10,000 colony forming units of bacteria per milliliter of urine. This colony count is not generally considered to be clinically significant.     Labs: CBC: Recent Labs  Lab 12/01/23 2027 12/02/23 0412 12/02/23 0728 12/02/23 1415 12/02/23 2022  WBC 8.0 6.6 6.0 6.8 7.1  HGB 6.6* 7.6* 7.9* 8.2* 8.3*  HCT 24.8* 27.0* 27.9* 28.6* 29.8*  MCV 74.5* 75.0* 74.2* 73.9* 74.1*  PLT 265 216 230 246 248   Basic Metabolic Panel: Recent Labs  Lab 12/01/23 1511 12/02/23 0728  NA 140 139  K 3.6 3.7  CL 103 106  CO2 25 25  GLUCOSE 146* 100*  BUN 15 9  CREATININE 0.83 0.51  CALCIUM  9.5 9.0   Liver  Function Tests: Recent Labs  Lab 12/01/23 1511  AST 17  ALT 7  ALKPHOS 120  BILITOT 0.6  PROT 7.4  ALBUMIN 4.0   CBG: No results for input(s): GLUCAP in the last 168 hours.  Discharge time spent: greater than 30 minutes.  Signed: Aimee Somerset, MD [facility] 12/03/2023

## 2023-12-03 NOTE — Anesthesia Postprocedure Evaluation (Signed)
 Anesthesia Post Note  Patient: Kimberly Mcdonald  Procedure(s) Performed: EGD (ESOPHAGOGASTRODUODENOSCOPY) COLONOSCOPY  Patient location during evaluation: Endoscopy Anesthesia Type: General Level of consciousness: awake and alert Pain management: pain level controlled Vital Signs Assessment: post-procedure vital signs reviewed and stable Respiratory status: spontaneous breathing, nonlabored ventilation and respiratory function stable Cardiovascular status: blood pressure returned to baseline and stable Postop Assessment: no apparent nausea or vomiting Anesthetic complications: no   No notable events documented.   Last Vitals:  Vitals:   12/03/23 1330 12/03/23 1351  BP: 102/81 (!) 132/94  Pulse: (!) 108 99  Resp: 20 18  Temp:  (!) 36.4 C  SpO2: 94% 99%    Last Pain:  Vitals:   12/03/23 1330  TempSrc:   PainSc: 0-No pain                 Fairy POUR Amijah Timothy

## 2023-12-03 NOTE — Op Note (Signed)
 [facility] Gastroenterology Patient Name: Kimberly Mcdonald Procedure Date: 12/03/2023 12:43 PM MRN: [id number] Account #: [id number] Date of Birth: 1965-04-22 Admit Type: Outpatient Age: 58 Room: [facility] ENDO ROOM 4 Gender: Female Note Status: Finalized Instrument Name: Colon Scope [id number] Procedure:             Colonoscopy Indications:           Iron  deficiency anemia Providers:             Rogelia Copping MD, MD Medicines:             Propofol  per Anesthesia Complications:         No immediate complications. Procedure:             Pre-Anesthesia Assessment:                        - Prior to the procedure, a History and Physical was                         performed, and patient medications and allergies were                         reviewed. The patient's tolerance of previous                         anesthesia was also reviewed. The risks and benefits                         of the procedure and the sedation options and risks                         were discussed with the patient. All questions were                         answered, and informed consent was obtained. Prior                         Anticoagulants: The patient has taken no anticoagulant                         or antiplatelet agents. ASA Grade Assessment: II - A                         patient with mild systemic disease. After reviewing                         the risks and benefits, the patient was deemed in                         satisfactory condition to undergo the procedure.                        After obtaining informed consent, the colonoscope was                         passed under direct vision. Throughout the procedure,                         the patient's blood pressure,  pulse, and oxygen                         saturations were monitored continuously. The                         Colonoscope was introduced through the anus and                         advanced to the the cecum, identified  by appendiceal                         orifice and ileocecal valve. The colonoscopy was                         performed without difficulty. The patient tolerated                         the procedure well. The quality of the bowel                         preparation was good. Findings:      The perianal and digital rectal examinations were normal.      Non-bleeding internal hemorrhoids were found during retroflexion. The       hemorrhoids were Grade I (internal hemorrhoids that do not prolapse).      The exam was otherwise without abnormality. Impression:            - Non-bleeding internal hemorrhoids.                        - The examination was otherwise normal.                        - No specimens collected. Recommendation:        - Return patient to hospital ward for ongoing care.                        - Resume previous diet.                        - Continue present medications. Procedure Code(s):     --- Professional ---                        [phone], Colonoscopy, flexible; diagnostic, including                         collection of specimen(s) by brushing or washing, when                         performed (separate procedure) Diagnosis Code(s):     --- Professional ---                        D50.9, Iron  deficiency anemia, unspecified CPT copyright 2022 American Medical Association. All rights reserved. The codes documented in this report are preliminary and upon coder review may  be revised to meet current compliance requirements. Rogelia Copping MD, MD 12/03/2023 1:07:41 PM This report has been signed electronically. Number of Addenda: 0 Note Initiated On: 12/03/2023 12:43 PM Scope  Withdrawal Time: 0 hours 5 minutes 23 seconds  Total Procedure Duration: 0 hours 9 minutes 46 seconds  Estimated Blood Loss:  Estimated blood loss: none.      [facility]

## 2023-12-03 NOTE — Progress Notes (Signed)
 Medication picked up from pharmacy, given to patient at bedside.

## 2023-12-03 NOTE — Anesthesia Preprocedure Evaluation (Addendum)
 Anesthesia Evaluation  Patient identified by MRN, date of birth, ID band Patient awake    Reviewed: Allergy & Precautions, NPO status , Patient's Chart, lab work & pertinent test results  History of Anesthesia Complications Negative for: history of anesthetic complications  Airway Mallampati: III  TM Distance: >3 FB Neck ROM: full    Dental  (+) Chipped, Poor Dentition   Pulmonary COPD, Current Smoker and Patient abstained from smoking.   Pulmonary exam normal        Cardiovascular Exercise Tolerance: Good hypertension, +CHF  Normal cardiovascular exam     Neuro/Psych  Neuromuscular disease CVA  negative psych ROS   GI/Hepatic Neg liver ROS, hiatal hernia,GERD  Controlled,,  Endo/Other  negative endocrine ROS    Renal/GU negative Renal ROS  negative genitourinary   Musculoskeletal  (+) Arthritis ,    Abdominal   Peds  Hematology  (+) Blood dyscrasia, anemia   Anesthesia Other Findings Patient is NPO appropriate and reports no nausea or vomiting today.  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 yrs  Past Surgical History: No date: ABDOMINAL SURGERY 11/09/2020: BREAST BIOPSY; Left     Comment:  stereo bx 11/09/2020-coil clip path pending 04/05/2020: COLONOSCOPY WITH PROPOFOL ; N/A     Comment:  Procedure: COLONOSCOPY WITH PROPOFOL ;  Surgeon: Tol[place],               Ladell POUR, MD;  Location: [facility] ENDOSCOPY;  Service:               Gastroenterology;  Laterality: N/A; 05/13/2019: ENDARTERECTOMY; Left     Comment:  Procedure: ENDARTERECTOMY CAROTID;  Surgeon: Jama Cordella MATSU, MD;  Location: [facility] ORS;  Service: Vascular;                Laterality: Left; 04/05/2020: ESOPHAGOGASTRODUODENOSCOPY (EGD) WITH PROPOFOL ; N/A     Comment:  Procedure: ESOPHAGOGASTRODUODENOSCOPY (EGD) WITH               PROPOFOL ;  Surgeon: Toledo, Ladell POUR, MD;  Location:               [facility] ENDOSCOPY;  Service: Gastroenterology;  Laterality:               N/A; 09/22/2020: ESOPHAGOGASTRODUODENOSCOPY (EGD) WITH PROPOFOL ; N/A     Comment:  Procedure: ESOPHAGOGASTRODUODENOSCOPY (EGD) WITH               PROPOFOL ;  Surgeon: Onita Elspeth Sharper, DO;  Location:              [facility] ENDOSCOPY;  Service: Gastroenterology;  Laterality:               N/A; No date: TUBAL LIGATION  BMI    Body Mass Index: 44.21 kg/m      Reproductive/Obstetrics negative OB ROS                              Anesthesia Physical Anesthesia Plan  ASA: 3  Anesthesia Plan: General   Post-op Pain Management:  Induction: Intravenous  PONV Risk Score and Plan: Propofol  infusion and TIVA  Airway Management Planned: Natural Airway and Nasal Cannula  Additional Equipment:   Intra-op Plan:   Post-operative Plan:   Informed Consent: I have reviewed the patients History and Physical, chart, labs and discussed the procedure including the risks, benefits and alternatives for the proposed anesthesia with the patient or authorized representative who has indicated his/her understanding and acceptance.     Dental Advisory Given  Plan Discussed with: Anesthesiologist, CRNA and Surgeon  Anesthesia Plan Comments: (Patient consented for risks of anesthesia including but not limited to:  - adverse reactions to medications - risk of airway placement if required - damage to eyes, teeth, lips or other oral mucosa - nerve damage due to positioning  - sore throat or hoarseness - Damage to heart, brain, nerves, lungs, other parts of body or loss of life  Patient voiced  understanding and assent.)         Anesthesia Quick Evaluation

## 2023-12-03 NOTE — Plan of Care (Signed)

## 2023-12-03 NOTE — Op Note (Signed)
 [facility]astroenterology Patient Name: Kimberly Mcdonald Procedure Date: 12/03/2023 12:44 PM MRN: [id number] Account #: [id number] Date of Birth: 04/08/1965 Admit Type: Inpatient Age: 58 Room: [facility] ENDO ROOM 4 Gender: Female Note Status: Finalized Instrument Name: Upper GI Scope [id number] Procedure:             Upper GI endoscopy Indications:           Iron  deficiency anemia Providers:             Rogelia Copping MD, MD Referring MD:          No Local Md, MD (Referring MD) Medicines:             Propofol  per Anesthesia Complications:         No immediate complications. Procedure:             Pre-Anesthesia Assessment:                        - Prior to the procedure, a History and Physical was                         performed, and patient medications and allergies were                         reviewed. The patient's tolerance of previous                         anesthesia was also reviewed. The risks and benefits                         of the procedure and the sedation options and risks                         were discussed with the patient. All questions were                         answered, and informed consent was obtained. Prior                         Anticoagulants: The patient has taken no anticoagulant                         or antiplatelet agents. ASA Grade Assessment: II - A                         patient with mild systemic disease. After reviewing                         the risks and benefits, the patient was deemed in                         satisfactory condition to undergo the procedure.                        After obtaining informed consent, the endoscope was                         passed under direct vision. Throughout the procedure,  the patient's blood pressure, pulse, and oxygen                         saturations were monitored continuously. The Endoscope                         was introduced through the mouth, and  advanced to the                         third part of duodenum. The upper GI endoscopy was                         accomplished without difficulty. The patient tolerated                         the procedure well. Findings:      A large hiatal hernia was present.      A large hiatal hernia with multiple Cameron ulcers was found.      The examined duodenum was normal. Impression:            - Large hiatal hernia.                        - Large hiatal hernia with multiple Cameron ulcers.                        - Normal examined duodenum.                        - No specimens collected. Recommendation:        - Return patient to hospital ward for ongoing care.                        - Resume previous diet.                        - Continue present medications.                        - Perform a colonoscopy today.                        - Consider antireflux surgery Procedure Code(s):     --- Professional ---                        [phone], Esophagogastroduodenoscopy, flexible,                         transoral; diagnostic, including collection of                         specimen(s) by brushing or washing, when performed                         (separate procedure) Diagnosis Code(s):     --- Professional ---                        D50.9, Iron  deficiency anemia, unspecified  K25.9, Gastric ulcer, unspecified as acute or chronic,                         without hemorrhage or perforation CPT copyright 2022 [employer]. All rights reserved. The codes documented in this report are preliminary and upon coder review may  be revised to meet current compliance requirements. Rogelia Copping MD, MD 12/03/2023 12:54:47 PM This report has been signed electronically. Number of Addenda: 0 Note Initiated On: 12/03/2023 12:44 PM Estimated Blood Loss:  Estimated blood loss: none.      [facility]

## 2023-12-03 NOTE — Discharge Planning (Signed)
 DC instructions given to patient at bedside IV removed Patient wants to pick up medication from pharmacy rather than wait (med to bed declined)  All questions answered

## 2023-12-03 NOTE — Transfer of Care (Signed)
 Immediate Anesthesia Transfer of Care Note  Patient: Alan Gerald Collet  Procedure(s) Performed: EGD (ESOPHAGOGASTRODUODENOSCOPY) COLONOSCOPY  Patient Location: PACU  Anesthesia Type:General  Level of Consciousness: sedated  Airway & Oxygen Therapy: Patient Spontanous Breathing  Post-op Assessment: Report given to RN and Post -op Vital signs reviewed and stable  Post vital signs: Reviewed and stable  Last Vitals:  Vitals Value Taken Time  BP 106/73 12/03/23 13:10  Temp    Pulse 108 12/03/23 13:10  Resp 18 12/03/23 13:10  SpO2 96 % 12/03/23 13:10    Last Pain:  Vitals:   12/03/23 1215  TempSrc: Temporal  PainSc: 0-No pain         Complications: No notable events documented.

## 2023-12-04 ENCOUNTER — Telehealth: Payer: Self-pay

## 2023-12-04 LAB — HIV-1/HIV-2 QUAL RNA
HIV-1 RNA, Qualitative: NONREACTIVE
HIV-2 RNA, Qualitative: NONREACTIVE

## 2023-12-04 NOTE — Telephone Encounter (Signed)
Attempted to call the patient. No answer and unable to leave a message.

## 2023-12-04 NOTE — Telephone Encounter (Signed)
-----   Message from [place] sent at 12/03/2023  2:58 PM EST ----- Regarding: follow up with Dr. jordis Lob,  This patient is known to Dr. Jordis and she's here for GI bleed related to a large hiatal hernia that she has.  She's being discharged home today.  She saw him back in 2023.  Can y'all arrange for follow up with Dr. Jordis for large hiatal hernia?  Ideally, it would be good if she gets an UGI barium swallow before seeing Pabon in office, as this was never done back in 2023.  Can y'all also arrange that please?  Thanks!  [employer]

## 2023-12-06 LAB — HIV-1/2 AB - DIFFERENTIATION
HIV 1 Ab: NONREACTIVE
HIV 2 Ab: NONREACTIVE
Note: NEGATIVE

## 2023-12-06 LAB — HIV-1/HIV-2 QUALITATIVE RNA
Final Interpretation: NEGATIVE
HIV-1 RNA, Qualitative: NONREACTIVE
HIV-2 RNA, Qualitative: NONREACTIVE

## 2023-12-07 NOTE — Telephone Encounter (Signed)
 Second attempt to contact the patient. No answer and unable to leave a message.
